# Patient Record
Sex: Male | Born: 1960 | Race: Black or African American | Hispanic: No | Marital: Married | Smoking: Never smoker
Health system: Southern US, Community
[De-identification: ages and names within clinical notes are randomized; demographics above are authoritative.]

## PROBLEM LIST (undated history)

## (undated) DIAGNOSIS — I10 Essential (primary) hypertension: Secondary | ICD-10-CM

## (undated) DIAGNOSIS — E119 Type 2 diabetes mellitus without complications: Secondary | ICD-10-CM

## (undated) DIAGNOSIS — M1990 Unspecified osteoarthritis, unspecified site: Secondary | ICD-10-CM

## (undated) DIAGNOSIS — M109 Gout, unspecified: Secondary | ICD-10-CM

## (undated) HISTORY — DX: Unspecified osteoarthritis, unspecified site: M19.90

## (undated) HISTORY — PX: CARPAL TUNNEL RELEASE: SHX101

## (undated) HISTORY — PX: FOOT SURGERY: SHX648

## (undated) HISTORY — DX: Type 2 diabetes mellitus without complications: E11.9

## (undated) HISTORY — PX: HAND SURGERY: SHX662

## (undated) HISTORY — PX: OTHER SURGICAL HISTORY: SHX169

## (undated) HISTORY — PX: SHOULDER SURGERY: SHX246

---

## 1997-09-17 ENCOUNTER — Emergency Department (HOSPITAL_COMMUNITY): Admission: EM | Admit: 1997-09-17 | Discharge: 1997-09-17 | Payer: Self-pay | Admitting: Emergency Medicine

## 1997-09-18 ENCOUNTER — Ambulatory Visit (HOSPITAL_BASED_OUTPATIENT_CLINIC_OR_DEPARTMENT_OTHER): Admission: RE | Admit: 1997-09-18 | Discharge: 1997-09-18 | Payer: Self-pay | Admitting: Orthopedic Surgery

## 2000-07-13 ENCOUNTER — Emergency Department (HOSPITAL_COMMUNITY): Admission: EM | Admit: 2000-07-13 | Discharge: 2000-07-13 | Payer: Self-pay | Admitting: Emergency Medicine

## 2002-02-27 ENCOUNTER — Emergency Department (HOSPITAL_COMMUNITY): Admission: EM | Admit: 2002-02-27 | Discharge: 2002-02-27 | Payer: Self-pay | Admitting: Emergency Medicine

## 2004-01-05 ENCOUNTER — Emergency Department (HOSPITAL_COMMUNITY): Admission: EM | Admit: 2004-01-05 | Discharge: 2004-01-05 | Payer: Self-pay | Admitting: Emergency Medicine

## 2006-02-13 ENCOUNTER — Emergency Department (HOSPITAL_COMMUNITY): Admission: EM | Admit: 2006-02-13 | Discharge: 2006-02-13 | Payer: Self-pay | Admitting: Emergency Medicine

## 2007-02-26 ENCOUNTER — Emergency Department (HOSPITAL_COMMUNITY): Admission: EM | Admit: 2007-02-26 | Discharge: 2007-02-26 | Payer: Self-pay | Admitting: Emergency Medicine

## 2007-06-02 ENCOUNTER — Emergency Department (HOSPITAL_COMMUNITY): Admission: EM | Admit: 2007-06-02 | Discharge: 2007-06-02 | Payer: Self-pay | Admitting: Family Medicine

## 2007-10-16 ENCOUNTER — Ambulatory Visit: Payer: Self-pay | Admitting: Pulmonary Disease

## 2007-10-16 ENCOUNTER — Inpatient Hospital Stay (HOSPITAL_COMMUNITY): Admission: EM | Admit: 2007-10-16 | Discharge: 2007-10-19 | Payer: Self-pay | Admitting: Emergency Medicine

## 2008-11-06 ENCOUNTER — Emergency Department (HOSPITAL_COMMUNITY): Admission: EM | Admit: 2008-11-06 | Discharge: 2008-11-06 | Payer: Self-pay | Admitting: Emergency Medicine

## 2008-12-02 ENCOUNTER — Emergency Department (HOSPITAL_COMMUNITY): Admission: EM | Admit: 2008-12-02 | Discharge: 2008-12-02 | Payer: Self-pay | Admitting: Emergency Medicine

## 2009-02-10 ENCOUNTER — Emergency Department (HOSPITAL_COMMUNITY): Admission: EM | Admit: 2009-02-10 | Discharge: 2009-02-10 | Payer: Self-pay | Admitting: Family Medicine

## 2009-08-17 ENCOUNTER — Emergency Department (HOSPITAL_COMMUNITY): Admission: EM | Admit: 2009-08-17 | Discharge: 2009-08-17 | Payer: Self-pay | Admitting: Emergency Medicine

## 2010-07-08 LAB — POCT URINALYSIS DIP (DEVICE)
Glucose, UA: NEGATIVE mg/dL
Ketones, ur: NEGATIVE mg/dL
Leukocytes, UA: NEGATIVE
Nitrite: NEGATIVE
Protein, ur: 30 mg/dL — AB
Specific Gravity, Urine: 1.01 (ref 1.005–1.030)
Urobilinogen, UA: 1 mg/dL (ref 0.0–1.0)
pH: 7 (ref 5.0–8.0)

## 2010-08-18 NOTE — H&P (Signed)
NAMEANDRIAN, SABALA               ACCOUNT NO.:  [id number]   MEDICAL RECORD NO.:  [id number]          PATIENT TYPE:  EMS   LOCATION:  [facility]JO                         FACILITY:  [facility]   PHYSICIAN:  Vanetta Mulders, MD         DATE OF BIRTH:  06/26/1960   DATE OF ADMISSION:  10/16/2007  DATE OF DISCHARGE:                              HISTORY & PHYSICAL   CHIEF COMPLAINT:  Malaise, fever, chills, and dull headache.   HISTORY OF PRESENT ILLNESS:  Mr. Govoni is a 50 year old gentleman with a  past medical history significant for hypertension, on blood pressure  medication enalapril that was started months ago, gout, on p.r.n.  indomethacin and otherwise allergic rhinitis and allergic sinusitis, who  presented to the ED for fever, chills, malaise, and dull headache.  The  patient also states that he has been having some generalized muscle  weakness, but he denies sore throat, lymphadenopathy, cough, shortness  of breath, chest pain, abdominal pain, nausea, vomiting, diarrhea,  urinary frequency, urinary urgency, rash, tick exposure, sick exposure,  clotting disorder, heat exposure, and staying in the sun and in the heat  for a longtime, over-medicating with the blood pressure pills.   PAST MEDICAL HISTORY:  1. Hypertension.  2. Gout.  3. Allergic rhinitis.  4. Allergic sinusitis.   SOCIAL HISTORY:  The patient is married, lives with his wife.  He denies  smoking or using illicit drugs.  He used marijuana and cocaine, he quit  in 1994.  Denies excessive drinking.  He had 2 beers and 2 shots of gin  on the day prior to the admission.  He works as a Music therapist.   MEDICATIONS:  Enalapril and indomethacin p.r.n.   PHYSICAL EXAMINATION:  VITAL SIGNS:  Temperature, initial 102.6 and  repeat 98.9, blood pressure on admission 76/47, increased with fluid  resuscitation to 84/50, heart rate 91 to 102, respiratory rate 18 to 20,  and oxygen saturation 95%.  GENERAL:  The patient was in no acute  distress.  HEENT:  Eyes, PERRLA.  Extraocular movements intact.  Oropharynx clear.  NECK:  Minimal 0.5 cm lymphadenopathy bilaterally.  No neck stiffness.  No sinus pressure.  CARDIOVASCULAR:  Regular rate and rhythm.  No murmurs.  No rubs or  gallops.  RESPIRATORY:  Very good air movement.  No crackles.  No wheezing.  ABDOMEN:  Bowel sounds positive.  Soft, nontender, and nondistended.  No  guarding.  No rebound.  SKIN:  No rash.  NEUROLOGIC:  Nonfocal.  No meningeal signs.  Alert and oriented.   LABORATORY DATA:  Sodium 137, potassium 3.6, chloride 102, bicarb 23,  BUN 13, creatinine 1.4, and glucose 120.  White count 10.5, hemoglobin  13.9, and platelets 277.   ASSESSMENT AND PLAN:  1. Malaise, fever, chills, and headache with no meningeal signs.      Differential diagnoses is very broad, and I would include flu      versus meningitis versus sinusitis versus gout versus other like      drug reaction, hyperthermia, or deep venous thrombosis, or  pulmonary embolism.  I will start the patient on Tamiflu.  I will      check a CT of head, sinus protocol, and I will start him on      Rocephin and vancomycin and continue fluid resuscitation.  I will      also check a UA, a urine pneumococcus antigen, urine gonococcus,      and chlamydia antigens, a chest x-ray, blood cultures, and an HIV      test.  The patient will be admitted to step-down unit for blood      pressure monitoring and fluid resuscitation.   1. Hypotension, most likely due to an infectious etiology even though      at this time, the source is unknown.  I will check blood cultures      and chest x-ray, UA, urine pneumococcus antigen, and sinus CT.  I      will have a very low threshold to do an LP if the CT head and CT      sinus protocol is negative.  I will also check a D-dimer and      cardiac enzymes and a cortisol level.   1. Allergic rhinitis, sinusitis.  It is possible that the patient has      acute  sinusitis.  I will start him on Flonase nasal spray for      symptoms.   1. Deep venous thrombosis prophylaxis will be done by early      ambulation.   1. Gastrointestinal prophylaxis, Protonix.      Vanetta Mulders, MD  Electronically Signed     DA/MEDQ  D:  10/16/2007  T:  10/16/2007  Job:  [id number]

## 2010-08-18 NOTE — Discharge Summary (Signed)
NAMEAMARA, [name]   MEDICAL RECORD NO.:  [id number]          PATIENT TYPE:  INP   LOCATION:  5528                         FACILITY:  [facility]   PHYSICIAN:  Kela Millin, M.D.DATE OF BIRTH:  [birth year]   DATE OF ADMISSION:  10/15/2007  DATE OF DISCHARGE:  10/19/2007                               DISCHARGE SUMMARY   DISCHARGE DIAGNOSES:  1. Febrile illness with sepsis syndrome - unclear etiology, resolved.  2. Hypertension.  3. History of gout.  4. History of allergic rhinitis.  5. Sinusitis.   PROCEDURE AND STUDIES:  CT scan of the head - no acute intracranial  abnormality.   CONSULTATIONS:  Critical care - Dr. Frederico Hamman.   BRIEF HISTORY:  The patient is a 50 year old white male with the above-  listed medical problems who presented with malaise, fevers, chills and a  dull headache.  He also reported that he had been having some  generalized muscle weakness, but he denied sore throat, cough, shortness  of breath, chest pain, abdominal pain, nausea, vomiting, diarrhea,  urinary frequency, rash, tick exposure and no sick contacts.  He also  denied any exposure.   He was seen in the ER.  Blood pressures initially on arrival 76/47 -  improved with fluids to 84/50, and his temperature 102.6 initially.  He  was admitted for further evaluation and management.   Please see the full admission history and physical dictated on October 16, 2007 by Dr. Frederico Hamman for the details of the admission physical exam as well  as the laboratory data.   HOSPITAL COURSE:  1. Febrile illness with sepsis syndrome - unclear etiology.  Upon      admission, the patient was started on broad-spectrum antibiotics      and also was placed in droplet isolation and the influenza/H1N1      tests ordered.  He was also empirically placed on Tamiflu.  The      empirical antibiotic coverage included doxycycline to cover for any      possible tick-borne diseases.  It was noted that  the patient did      not have any meningeal signs/symptoms.  He was aggressively      hydrated with normal saline with good response and normalization of      his blood pressures.  Critical Care was consulted, and they      followed the patient.  It was noted that the patient was      lymphopenic, and so an HIV test was done which was nonreactive; a      urine strep test was also done, and this was negative.  Urinalysis      was unremarkable for infection.  Blood cultures were sent and to      date no growth.  The patient was monitored initially in the step-      down until his hypotension resolved.  As he was improving      clinically overall and with the cultures being negative, the      vancomycin was discontinued and the patient monitored off it.  The      patient remained afebrile of the vancomycin.  The H1N1 PCR test      came back today, and it is negative.  The patient was      hemodynamically stable.  He is ambulating without difficulty and      had remained afebrile for 48 hours.  He is requesting to go home at      this time and will be discharged on oral doxycycline and is to      follow up with his primary care physician.  A GC chlamydia probe      was also done in the hospital, and this was negative as well.  A      random cortisol level also was within normal limits.  The etiology      of fever with sepsis syndrome is unclear.  As already mentioned,      the patient is to follow up with his primary care physician.  2. Hypertension - once the patient's hypotension, as discussed above,      resolved.  The patient's blood pressures were remaining elevated,      and so he was restarted on his outpatient antihypertensives.  He is      to continue this upon discharge.  3. History of gout - the patient is to continue outpatient medications      upon discharge.  4. History of allergic rhinitis - the patient was placed on Flonase      during his hospital stay.   DISCHARGE  MEDICATIONS:  1. Doxycycline 100 mg p.o. b.i.d. for 10 more days.  2. Flonase 1 spray b.i.d.  3. Patient to continue enalapril 10 mg p.o. daily and also continue      indomethacin p.r.n. as previously.   FOLLOW-UP CARE:  Dr. Charise Carwin Sun/PCP in 1-2 weeks.   DISCHARGE CONDITION:  Improved/stable.      Kela Millin, M.D.  Electronically Signed     ACV/MEDQ  D:  10/19/2007  T:  10/19/2007  Job:  [id number]   cc:   Deatra James, M.D.

## 2010-12-31 LAB — COMPREHENSIVE METABOLIC PANEL
ALT: 35
AST: 44 — ABNORMAL HIGH
Albumin: 3.6
Alkaline Phosphatase: 47
BUN: 8
CO2: 23
Calcium: 8.8
Chloride: 106
Creatinine, Ser: 1.17
GFR calc Af Amer: >60
GFR calc non Af Amer: >60
Glucose, Bld: 148 — ABNORMAL HIGH
Potassium: 4
Sodium: 138
Total Bilirubin: 0.9
Total Protein: 6.7

## 2010-12-31 LAB — URINALYSIS, ROUTINE W REFLEX MICROSCOPIC
Bilirubin Urine: NEGATIVE
Glucose, UA: NEGATIVE
Hgb urine dipstick: NEGATIVE
Ketones, ur: NEGATIVE
Nitrite: NEGATIVE
Protein, ur: NEGATIVE
Specific Gravity, Urine: 1.011
Urobilinogen, UA: 0.2
pH: 6.5

## 2010-12-31 LAB — PROTIME-INR
INR: 1.1
Prothrombin Time: 14.3

## 2010-12-31 LAB — BASIC METABOLIC PANEL
BUN: 8
CO2: 26
Calcium: 8.9
Chloride: 106
Creatinine, Ser: 1.02
GFR calc Af Amer: >60
GFR calc non Af Amer: >60
Glucose, Bld: 126 — ABNORMAL HIGH
Potassium: 3.9
Sodium: 138

## 2010-12-31 LAB — CULTURE, BLOOD (ROUTINE X 2)
Culture: NO GROWTH
Culture: NO GROWTH

## 2010-12-31 LAB — POCT I-STAT, CHEM 8
BUN: 13
Calcium, Ion: 1.17
Chloride: 102
Creatinine, Ser: 1.4
Glucose, Bld: 120 — ABNORMAL HIGH
HCT: 45
Hemoglobin: 15.3
Potassium: 3.6
Sodium: 137
TCO2: 23

## 2010-12-31 LAB — CBC
HCT: 38.6 — ABNORMAL LOW
HCT: 41.5
Hemoglobin: 12.9 — ABNORMAL LOW
Hemoglobin: 13.9
MCHC: 33.4
MCHC: 33.5
MCV: 82.3
MCV: 83.1
Platelets: 270
Platelets: 277
RBC: 4.65
RBC: 5.03
RDW: 13.1
RDW: 13.7
WBC: 10.5
WBC: 10.5

## 2010-12-31 LAB — DIFFERENTIAL
Basophils Absolute: 0
Basophils Absolute: 0
Basophils Relative: 0
Basophils Relative: 1
Eosinophils Absolute: 0
Eosinophils Absolute: 0.1
Eosinophils Relative: 0
Eosinophils Relative: 1
Lymphocytes Relative: 14
Lymphocytes Relative: 4 — ABNORMAL LOW
Lymphs Abs: 0.4 — ABNORMAL LOW
Lymphs Abs: 1.5
Monocytes Absolute: 0.2
Monocytes Absolute: 1.4 — ABNORMAL HIGH
Monocytes Relative: 13 — ABNORMAL HIGH
Monocytes Relative: 2 — ABNORMAL LOW
Neutro Abs: 7.4
Neutro Abs: 9.8 — ABNORMAL HIGH
Neutrophils Relative %: 71
Neutrophils Relative %: 93 — ABNORMAL HIGH

## 2010-12-31 LAB — CORTISOL: Cortisol, Plasma: 15.9

## 2010-12-31 LAB — SEDIMENTATION RATE: Sed Rate: 4

## 2010-12-31 LAB — H1N1 SCREEN (PCR): H1N1 Virus Scrn: NOT DETECTED

## 2010-12-31 LAB — HIV ANTIBODY (ROUTINE TESTING W REFLEX): HIV: NONREACTIVE

## 2010-12-31 LAB — LEGIONELLA ANTIGEN, URINE: Legionella Antigen, Urine: NEGATIVE

## 2010-12-31 LAB — APTT: aPTT: 31

## 2010-12-31 LAB — LACTIC ACID, PLASMA
Lactic Acid, Venous: 1.9
Lactic Acid, Venous: 3 — ABNORMAL HIGH

## 2010-12-31 LAB — TROPONIN I: Troponin I: <0.01

## 2010-12-31 LAB — CK TOTAL AND CKMB (NOT AT ARMC)
CK, MB: 1.5
Relative Index: 0.6
Total CK: 231

## 2010-12-31 LAB — LIPASE, BLOOD: Lipase: 24

## 2010-12-31 LAB — D-DIMER, QUANTITATIVE: D-Dimer, Quant: 0.51 — ABNORMAL HIGH

## 2010-12-31 LAB — STREP PNEUMONIAE URINARY ANTIGEN: Strep Pneumo Urinary Antigen: NEGATIVE

## 2010-12-31 LAB — GC/CHLAMYDIA PROBE AMP, URINE
Chlamydia, Swab/Urine, PCR: NEGATIVE
GC Probe Amp, Urine: NEGATIVE

## 2010-12-31 LAB — C-REACTIVE PROTEIN: CRP: 3.7 — ABNORMAL HIGH (ref <0.6)

## 2011-01-01 LAB — BASIC METABOLIC PANEL
BUN: 6
BUN: 7
CO2: 25
CO2: 25
Calcium: 8.8
Calcium: 9.1
Chloride: 104
Chloride: 106
Creatinine, Ser: 0.91
Creatinine, Ser: 1.02
GFR calc Af Amer: >60
GFR calc Af Amer: >60
GFR calc non Af Amer: >60
GFR calc non Af Amer: >60
Glucose, Bld: 109 — ABNORMAL HIGH
Glucose, Bld: 125 — ABNORMAL HIGH
Potassium: 3.6
Potassium: 3.8
Sodium: 140
Sodium: 140

## 2011-01-01 LAB — CBC
HCT: 36.8 — ABNORMAL LOW
Hemoglobin: 12.5 — ABNORMAL LOW
MCHC: 34
MCV: 82.4
Platelets: 285
RBC: 4.47
RDW: 14.1
WBC: 6.9

## 2011-01-01 LAB — DIFFERENTIAL
Basophils Absolute: 0
Basophils Relative: 0
Eosinophils Absolute: 0.1
Eosinophils Relative: 1
Lymphocytes Relative: 23
Lymphs Abs: 1.6
Monocytes Absolute: 1.1 — ABNORMAL HIGH
Monocytes Relative: 16 — ABNORMAL HIGH
Neutro Abs: 4.1
Neutrophils Relative %: 60

## 2011-09-21 ENCOUNTER — Emergency Department (HOSPITAL_COMMUNITY)
Admission: EM | Admit: 2011-09-21 | Discharge: 2011-09-21 | Disposition: A | Discharge status: Home / Self Care | Payer: 59 | Source: Home / Self Care | Attending: Emergency Medicine | Admitting: Emergency Medicine

## 2011-09-21 ENCOUNTER — Encounter (HOSPITAL_COMMUNITY): Payer: Self-pay | Admitting: Emergency Medicine

## 2011-09-21 DIAGNOSIS — M109 Gout, unspecified: Secondary | ICD-10-CM

## 2011-09-21 HISTORY — DX: Gout, unspecified: M10.9

## 2011-09-21 LAB — URIC ACID: Uric Acid, Serum: 6.3 mg/dL (ref 4.0–7.8)

## 2011-09-21 MED ORDER — ALLOPURINOL 300 MG PO TABS: 300.0000 mg | ORAL_TABLET | Freq: Every day | ORAL | Status: DC

## 2011-09-21 MED ORDER — METHYLPREDNISOLONE ACETATE 80 MG/ML IJ SUSP
INTRAMUSCULAR | Status: AC
  Filled 2011-09-21: qty 1

## 2011-09-21 MED ORDER — METHYLPREDNISOLONE ACETATE 80 MG/ML IJ SUSP
80.0000 mg | Freq: Once | INTRAMUSCULAR | Status: AC
  Administered 2011-09-21: 80 mg via INTRAMUSCULAR

## 2011-09-21 MED ORDER — INDOMETHACIN 50 MG PO CAPS: ORAL_CAPSULE | ORAL | Status: DC

## 2011-09-21 MED ORDER — COLCHICINE 0.6 MG PO TABS: ORAL_TABLET | ORAL | Status: DC

## 2011-09-21 NOTE — ED Notes (Signed)
Left foot pain, initially in left ankle, then involving the foot.  Onset 2 weeks ago.  History of gout

## 2011-09-21 NOTE — ED Provider Notes (Signed)
Chief Complaint  Patient presents with  . Gout    History of Present Illness:   The patient is a 51 year old male who has had a long-standing history of tophaceous gout with he had been on allopurinol but got off of this about a year ago. His last gout attack was about 6 months ago up until 2 weeks ago when he developed pain over the dorsum of the left foot which radiates into the ankle and into the big toe of the left foot. There is swelling, redness, and heat over the dorsum of the foot and it hurts to bear weight. He denies any numbness or tingling. There's been no recent trauma. Possible precipitating factors include ingesting a large amount of seafood and drinking some alcohol.  Review of Systems:  Other than noted above, the patient denies any of the following symptoms: Systemic:  No fevers, chills, sweats, or aches.  No fatigue or tiredness. Musculoskeletal:  No joint pain, arthritis, bursitis, swelling, back pain, or neck pain. Neurological:  No muscular weakness, paresthesias, headache, or trouble with speech or coordination.  No dizziness.   PMFSH:  Past medical history, family history, social history, meds, and allergies were reviewed.  Physical Exam:   Vital signs:  BP 125/73  Pulse 85  Temp 98.8 F (37.1 C) (Oral)  Resp 14  SpO2 98% Gen:  Alert and oriented times 3.  In no distress. Musculoskeletal: There was swelling, redness, heat, and tenderness to palpation over the dorsum of the foot laterally. There was no pain over the ankle and no pain over the MTP of the great toe. Ankles and toes all have full range of motion with no pain, pulses were full, good capillary refill. Otherwise, all joints had a full a ROM with no swelling, bruising or deformity.  No edema, pulses full. Extremities were warm and pink.  Capillary refill was brisk.  Skin:  Clear, warm and dry.  No rash. Neuro:  Alert and oriented times 3.  Muscle strength was normal.  Sensation was intact to light touch.    Course in Urgent Care Center:  The patient was given Depo-Medrol 80 mg IM and tolerated this well without any immediate side effects.   Assessment:  The encounter diagnosis was Gout attack.  Plan:   1.  The following meds were prescribed:   New Prescriptions   ALLOPURINOL (ZYLOPRIM) 300 MG TABLET    Take 1 tablet (300 mg total) by mouth daily.   COLCHICINE 0.6 MG TABLET    Take 2 now and 1 in 1 hour.  May repeat dose once daily.  For gout attack.   INDOMETHACIN (INDOCIN) 50 MG CAPSULE    Take 1 TID with food for gout.  Taper off when attack subsides.   2.  The patient was instructed in symptomatic care, including rest and activity, elevation, application of ice and compression.  Appropriate handouts were given. 3.  The patient was told to return if becoming worse in any way, if no better in 3 or 4 days, and given some red flag symptoms that would indicate earlier return.   4.  The patient was told to follow up with Dr. Deatra James in one week. In the meantime I suggested some dietary changes. I suggested he hold off on allopurinol until his symptoms had subsided.   Reuben Likes, MD 09/21/11 [phone]

## 2011-09-21 NOTE — Discharge Instructions (Signed)
Dietary treatment plays a supplementary role in treatment of gout.  Diet can be expected to decrease the uric acid level by 10 to 15%.  Often medication can effect a much more substantial reduction.  An extremely restrictive diet is not necessary, but here are a few suggestions that might help decrease the frequency of gout attacks. ° °The first and most important measure is to achieve and maintain a healthy body weight (BMI of 20 to 25).  It's been shown that people with a BMI over 25 have an increased risk of gout attacks compared to people with a BMI of less than 25.  Also, gout patients who lose as little as 4.5 kg or 9.9 lbs will decrease their risk of gout attacks. ° °Beyond that, here are a few general guidelines: ° °Eat less: °Red meat °Seafood °Beer and hard alcohol (e.g. gin, vodka, whiskey) °Foods that contain high fructose corn syrup (found in sweets and non-diet sodas) °Organ meats (liver, kidneys, brains, sweetbreads) or foods made from these meats (hot dogs, bologna). ° °Eat more: °Low fat dairy products °A moderate amount of wine (up to two 5 oz servings per day) is not likely to increase the risk of a gout attack. °Coffee may decrease the risk of gout attacks °Vitamin C (500 mg per day) has a mild urate lowering effect ° °

## 2012-04-26 ENCOUNTER — Emergency Department (INDEPENDENT_AMBULATORY_CARE_PROVIDER_SITE_OTHER)
Admission: EM | Admit: 2012-04-26 | Discharge: 2012-04-26 | Disposition: A | Discharge status: Home / Self Care | Payer: 59 | Source: Home / Self Care

## 2012-04-26 ENCOUNTER — Encounter (HOSPITAL_COMMUNITY): Payer: Self-pay | Admitting: *Deleted

## 2012-04-26 DIAGNOSIS — Z8739 Personal history of other diseases of the musculoskeletal system and connective tissue: Secondary | ICD-10-CM

## 2012-04-26 DIAGNOSIS — M25461 Effusion, right knee: Secondary | ICD-10-CM

## 2012-04-26 DIAGNOSIS — M25569 Pain in unspecified knee: Secondary | ICD-10-CM

## 2012-04-26 DIAGNOSIS — M25469 Effusion, unspecified knee: Secondary | ICD-10-CM

## 2012-04-26 HISTORY — DX: Essential (primary) hypertension: I10

## 2012-04-26 LAB — GRAM STAIN

## 2012-04-26 LAB — SYNOVIAL CELL COUNT + DIFF, W/ CRYSTALS
Lymphocytes-Synovial Fld: 6 % (ref 0–20)
Monocyte-Macrophage-Synovial Fluid: 30 % — ABNORMAL LOW (ref 50–90)
Neutrophil, Synovial: 64 % — ABNORMAL HIGH (ref 0–25)
WBC, Synovial: 45589 /mm3 — ABNORMAL HIGH (ref 0–200)

## 2012-04-26 MED ORDER — TRIAMCINOLONE ACETONIDE 40 MG/ML IJ SUSP
40.0000 mg | Freq: Once | INTRAMUSCULAR | Status: AC
  Administered 2012-04-26: 40 mg via INTRAMUSCULAR

## 2012-04-26 MED ORDER — INDOMETHACIN 50 MG PO CAPS: 50.0000 mg | ORAL_CAPSULE | Freq: Two times a day (BID) | ORAL | Status: DC

## 2012-04-26 MED ORDER — TRIAMCINOLONE ACETONIDE 40 MG/ML IJ SUSP
INTRAMUSCULAR | Status: AC
Start: 2012-04-26 — End: 2012-04-26
  Filled 2012-04-26: qty 5

## 2012-04-26 MED ORDER — COLCHICINE 0.6 MG PO TABS: ORAL_TABLET | ORAL | Status: DC

## 2012-04-26 MED ORDER — HYDROCODONE-ACETAMINOPHEN 5-325 MG PO TABS: 1.0000 | ORAL_TABLET | ORAL | Status: DC | PRN

## 2012-04-26 NOTE — ED Provider Notes (Signed)
History     CSN: [id number]  Arrival date & time 04/26/12  1347   None     Chief Complaint  Patient presents with  . Knee Pain    (Consider location/radiation/quality/duration/timing/severity/associated sxs/prior treatment) HPI Comments: 52 year old male presents with moderate to severe right knee pain for approximately one week. He denies any known injury although his job requires climbing ladders and moving furniture. 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 He denies fever, chills or other constitutional symptoms.   Past Medical History  Diagnosis Date  . Gout   . Hypertension     Past Surgical History  Procedure Date  . Foot surgery     left   . Shoulder surgery     right  . Hand surgery     left  . Carpal tunnel release     bilateral    Family History  Problem Relation Age of Onset  . Cancer Mother   . Cancer Father   . Cancer Brother     History  Substance Use Topics  . Smoking status: Never Smoker   . Smokeless tobacco: Not on file  . Alcohol Use: Yes      Review of Systems  Constitutional: Negative.   Respiratory: Negative.   Gastrointestinal: Negative.   Genitourinary: Negative.   Musculoskeletal: Positive for joint swelling, arthralgias and gait problem.       As per HPI  Skin: Negative.   Neurological: Negative for dizziness, weakness, numbness and headaches.  Psychiatric/Behavioral: Negative.     Allergies  Review of patient's allergies indicates no known allergies.  Home Medications   Current Outpatient Rx  Name  Route  Sig  Dispense  Refill  . IBUPROFEN 800 MG PO TABS   Oral   Take 800 mg by mouth every 8 (eight) hours as needed.         . ALLOPURINOL 300 MG PO TABS   Oral   Take 1 tablet (300 mg total) by mouth daily.   30 tablet   0   . COLCHICINE 0.6 MG PO TABS      Take 2 now and 1 in 1 hour.  May repeat dose once daily.  For gout attack.   12  tablet   0   . COLCHICINE 0.6 MG PO TABS      2 tabs po x 1, then one tab po 1 hour later, then 1 q d. Take with food.   10 tablet   0   . HYDROCODONE-ACETAMINOPHEN 5-325 MG PO TABS   Oral   Take 1 tablet by mouth every 4 (four) hours as needed for pain.   20 tablet   0   . INDOMETHACIN 50 MG PO CAPS      Take 1 TID with food for gout.  Taper off when attack subsides.   30 capsule   0   . INDOMETHACIN 50 MG PO CAPS   Oral   Take 1 capsule (50 mg total) by mouth 2 (two) times daily with a meal.   20 capsule   0     BP 126/71  Pulse 104  Temp 98.5 F (36.9 C) (Oral)  Resp 16  Ht 5\' 5"  (1.651 m)  Wt 190 lb (86.183 kg)  BMI 31.62 kg/m2  SpO2 96%  Physical Exam  Nursing note and vitals reviewed. Constitutional: He is oriented to person, place, and time. He appears well-developed and  well-nourished. No distress.  HENT:  Head: Normocephalic and atraumatic.  Eyes: EOM are normal. Left eye exhibits no discharge.  Neck: Normal range of motion. Neck supple.  Pulmonary/Chest: Effort normal.  Musculoskeletal: He exhibits edema and tenderness.       Right knee swelling and generally tender. There is much pain associated with flexion and extension. He is unable to flex beyond 15 and is unable to extend beyond 170. There is increased warmth around the entire knee but no lymphangitis. There is pretibial swelling over the bursa. Ambulation is quite difficult as he is unable to put any weight on the knee.   Neurological: He is alert and oriented to person, place, and time. No cranial nerve deficit.  Skin: Skin is warm and dry.  Psychiatric: He has a normal mood and affect.    ED Course  Apiration of blood/fluid Date/Time: 04/26/2012 2:15 PM Performed by: Phineas Real Soua Caltagirone Authorized by: Phineas Real, Xzavier Swinger Consent: Verbal consent obtained. Risks and benefits: risks, benefits and alternatives were discussed Consent given by: patient Patient understanding: patient states understanding  of the procedure being performed Patient identity confirmed: verbally with patient Preparation: Patient was prepped and draped in the usual sterile fashion. Local anesthesia used: yes Anesthesia: local infiltration Local anesthetic: lidocaine 2% without epinephrine Anesthetic total: 5 ml Patient sedated: no Patient tolerance: Patient tolerated the procedure well with no immediate complications. Comments: After the dermis was anesthetized along with the track into the joint an 18-gauge needle was inserted into the lateral joint space. There was no resistance and the needle enter the joint space smoothly. 43 cc of cloudy amber fluid was aspirated. No blood was seen in the fluid.   (including critical care time)  Labs Reviewed - No data to display No results found.   1. Knee effusion, right   2. Knee pain   3. H/O acute gouty arthritis       MDM  The knee was aspirated and obtained a 43 cc of cloudy amber fluid. No redness indicating a traumatic tap. Presumptive diagnosis is gouty arthritis and will be treated with colchicine 0.6 mg 2 now repeat one tablet one hour and then 1 daily for 6 days. Indomethacin 50 mg with food 3 times a day when necessary Norco 5 mg one every 4 hours when necessary pain Kenalog 40 mg IM now Return to urgent care Friday or 2 days, if there is little to no improvement or any worsening. A specimen was sent to the lab for crystals, WBC, Gram stain and culture.         Hayden Rasmussen, NP 04/26/12 1558

## 2012-04-26 NOTE — ED Notes (Signed)
5-6 days of right knee pain.  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   H ambulated in the treatment room with a limp

## 2012-04-26 NOTE — ED Provider Notes (Signed)
Medical screening examination/treatment/procedure(s) were performed by non-physician practitioner and as supervising physician I was immediately available for consultation/collaboration.  Raynald Blend, MD 04/26/12 [phone]

## 2012-04-30 LAB — BODY FLUID CULTURE: Culture: NO GROWTH

## 2012-05-15 NOTE — ED Notes (Addendum)
Gram stain: No organisms seen, Body fluid culture: No growth, synovial fluid cell count: Yellow, turbid,  WBC's 45,589, Neut. 64, Lymphs 6, Monos's 30.  Vassie Moselle 05/15/2012 2/10  No further action needed per Hayden Rasmussen NP.  Vassie Moselle 05/18/2012

## 2013-12-06 ENCOUNTER — Emergency Department (HOSPITAL_COMMUNITY)
Admission: EM | Admit: 2013-12-06 | Discharge: 2013-12-06 | Disposition: A | Discharge status: Home / Self Care | Payer: 59 | Attending: Emergency Medicine | Admitting: Emergency Medicine

## 2013-12-06 DIAGNOSIS — Z862 Personal history of diseases of the blood and blood-forming organs and certain disorders involving the immune mechanism: Secondary | ICD-10-CM | POA: Insufficient documentation

## 2013-12-06 DIAGNOSIS — R519 Headache, unspecified: Secondary | ICD-10-CM

## 2013-12-06 DIAGNOSIS — Z8639 Personal history of other endocrine, nutritional and metabolic disease: Secondary | ICD-10-CM | POA: Insufficient documentation

## 2013-12-06 DIAGNOSIS — Z79899 Other long term (current) drug therapy: Secondary | ICD-10-CM | POA: Diagnosis not present

## 2013-12-06 DIAGNOSIS — R51 Headache: Secondary | ICD-10-CM | POA: Insufficient documentation

## 2013-12-06 DIAGNOSIS — I10 Essential (primary) hypertension: Secondary | ICD-10-CM | POA: Diagnosis not present

## 2013-12-06 MED ORDER — PROCHLORPERAZINE EDISYLATE 5 MG/ML IJ SOLN
10.0000 mg | Freq: Four times a day (QID) | INTRAMUSCULAR | Status: DC | PRN
  Administered 2013-12-06: 10 mg via INTRAVENOUS
  Filled 2013-12-06: qty 2

## 2013-12-06 MED ORDER — KETOROLAC TROMETHAMINE 15 MG/ML IJ SOLN
15.0000 mg | Freq: Once | INTRAMUSCULAR | Status: AC
  Administered 2013-12-06: 15 mg via INTRAVENOUS
  Filled 2013-12-06: qty 1

## 2013-12-06 MED ORDER — DIPHENHYDRAMINE HCL 50 MG/ML IJ SOLN
25.0000 mg | Freq: Once | INTRAMUSCULAR | Status: AC
  Administered 2013-12-06: 25 mg via INTRAVENOUS
  Filled 2013-12-06: qty 1

## 2013-12-06 MED ORDER — SODIUM CHLORIDE 0.9 % IV BOLUS (SEPSIS)
1000.0000 mL | Freq: Once | INTRAVENOUS | Status: AC
  Administered 2013-12-06: 1000 mL via INTRAVENOUS

## 2013-12-06 NOTE — Discharge Instructions (Signed)
Headaches, Frequently Asked Questions °MIGRAINE HEADACHES °Q: What is migraine? What causes it? How can I treat it? °A: Generally, migraine headaches begin as a dull ache. Then they develop into a constant, throbbing, and pulsating pain. You may experience pain at the temples. You may experience pain at the front or back of one or both sides of the head. The pain is usually accompanied by a combination of: °· Nausea. °· Vomiting. °· Sensitivity to light and noise. °Some people (about 15%) experience an aura (see below) before an attack. The cause of migraine is believed to be chemical reactions in the brain. Treatment for migraine may include over-the-counter or prescription medications. It may also include self-help techniques. These include relaxation training and biofeedback.  °Q: What is an aura? °A: About 15% of people with migraine get an "aura". This is a sign of neurological symptoms that occur before a migraine headache. You may see wavy or jagged lines, dots, or flashing lights. You might experience tunnel vision or blind spots in one or both eyes. The aura can include visual or auditory hallucinations (something imagined). It may include disruptions in smell (such as strange odors), taste or touch. Other symptoms include: °· Numbness. °· A "pins and needles" sensation. °· Difficulty in recalling or speaking the correct word. °These neurological events may last as long as 60 minutes. These symptoms will fade as the headache begins. °Q: What is a trigger? °A: Certain physical or environmental factors can lead to or "trigger" a migraine. These include: °· Foods. °· Hormonal changes. °· Weather. °· Stress. °It is important to remember that triggers are different for everyone. To help prevent migraine attacks, you need to figure out which triggers affect you. Keep a headache diary. This is a good way to track triggers. The diary will help you talk to your healthcare professional about your condition. °Q: Does  weather affect migraines? °A: Bright sunshine, hot, humid conditions, and drastic changes in barometric pressure may lead to, or "trigger," a migraine attack in some people. But studies have shown that weather does not act as a trigger for everyone with migraines. °Q: What is the link between migraine and hormones? °A: Hormones start and regulate many of your body's functions. Hormones keep your body in balance within a constantly changing environment. The levels of hormones in your body are unbalanced at times. Examples are during menstruation, pregnancy, or menopause. That can lead to a migraine attack. In fact, about three quarters of all women with migraine report that their attacks are related to the menstrual cycle.  °Q: Is there an increased risk of stroke for migraine sufferers? °A: The likelihood of a migraine attack causing a stroke is very remote. That is not to say that migraine sufferers cannot have a stroke associated with their migraines. In persons under age 40, the most common associated factor for stroke is migraine headache. But over the course of a person's normal life span, the occurrence of migraine headache may actually be associated with a reduced risk of dying from cerebrovascular disease due to stroke.  °Q: What are acute medications for migraine? °A: Acute medications are used to treat the pain of the headache after it has started. Examples over-the-counter medications, NSAIDs, ergots, and triptans.  °Q: What are the triptans? °A: Triptans are the newest class of abortive medications. They are specifically targeted to treat migraine. Triptans are vasoconstrictors. They moderate some chemical reactions in the brain. The triptans work on receptors in your brain. Triptans help   to restore the balance of a neurotransmitter called serotonin. Fluctuations in levels of serotonin are thought to be a main cause of migraine.  °Q: Are over-the-counter medications for migraine effective? °A:  Over-the-counter, or "OTC," medications may be effective in relieving mild to moderate pain and associated symptoms of migraine. But you should see your caregiver before beginning any treatment regimen for migraine.  °Q: What are preventive medications for migraine? °A: Preventive medications for migraine are sometimes referred to as "prophylactic" treatments. They are used to reduce the frequency, severity, and length of migraine attacks. Examples of preventive medications include antiepileptic medications, antidepressants, beta-blockers, calcium channel blockers, and NSAIDs (nonsteroidal anti-inflammatory drugs). °Q: Why are anticonvulsants used to treat migraine? °A: During the past few years, there has been an increased interest in antiepileptic drugs for the prevention of migraine. They are sometimes referred to as "anticonvulsants". Both epilepsy and migraine may be caused by similar reactions in the brain.  °Q: Why are antidepressants used to treat migraine? °A: Antidepressants are typically used to treat people with depression. They may reduce migraine frequency by regulating chemical levels, such as serotonin, in the brain.  °Q: What alternative therapies are used to treat migraine? °A: The term "alternative therapies" is often used to describe treatments considered outside the scope of conventional Western medicine. Examples of alternative therapy include acupuncture, acupressure, and yoga. Another common alternative treatment is herbal therapy. Some herbs are believed to relieve headache pain. Always discuss alternative therapies with your caregiver before proceeding. Some herbal products contain arsenic and other toxins. °TENSION HEADACHES °Q: What is a tension-type headache? What causes it? How can I treat it? °A: Tension-type headaches occur randomly. They are often the result of temporary stress, anxiety, fatigue, or anger. Symptoms include soreness in your temples, a tightening band-like sensation  around your head (a "vice-like" ache). Symptoms can also include a pulling feeling, pressure sensations, and contracting head and neck muscles. The headache begins in your forehead, temples, or the back of your head and neck. Treatment for tension-type headache may include over-the-counter or prescription medications. Treatment may also include self-help techniques such as relaxation training and biofeedback. °CLUSTER HEADACHES °Q: What is a cluster headache? What causes it? How can I treat it? °A: Cluster headache gets its name because the attacks come in groups. The pain arrives with little, if any, warning. It is usually on one side of the head. A tearing or bloodshot eye and a runny nose on the same side of the headache may also accompany the pain. Cluster headaches are believed to be caused by chemical reactions in the brain. They have been described as the most severe and intense of any headache type. Treatment for cluster headache includes prescription medication and oxygen. °SINUS HEADACHES °Q: What is a sinus headache? What causes it? How can I treat it? °A: When a cavity in the bones of the face and skull (a sinus) becomes inflamed, the inflammation will cause localized pain. This condition is usually the result of an allergic reaction, a tumor, or an infection. If your headache is caused by a sinus blockage, such as an infection, you will probably have a fever. An x-ray will confirm a sinus blockage. Your caregiver's treatment might include antibiotics for the infection, as well as antihistamines or decongestants.  °REBOUND HEADACHES °Q: What is a rebound headache? What causes it? How can I treat it? °A: A pattern of taking acute headache medications too often can lead to a condition known as "rebound headache."   A pattern of taking too much headache medication includes taking it more than 2 days per week or in excessive amounts. That means more than the label or a caregiver advises. With rebound  headaches, your medications not only stop relieving pain, they actually begin to cause headaches. Doctors treat rebound headache by tapering the medication that is being overused. Sometimes your caregiver will gradually substitute a different type of treatment or medication. Stopping may be a challenge. Regularly overusing a medication increases the potential for serious side effects. Consult a caregiver if you regularly use headache medications more than 2 days per week or more than the label advises. °ADDITIONAL QUESTIONS AND ANSWERS °Q: What is biofeedback? °A: Biofeedback is a self-help treatment. Biofeedback uses special equipment to monitor your body's involuntary physical responses. Biofeedback monitors: °· Breathing. °· Pulse. °· Heart rate. °· Temperature. °· Muscle tension. °· Brain activity. °Biofeedback helps you refine and perfect your relaxation exercises. You learn to control the physical responses that are related to stress. Once the technique has been mastered, you do not need the equipment any more. °Q: Are headaches hereditary? °A: Four out of five (80%) of people that suffer report a family history of migraine. Scientists are not sure if this is genetic or a family predisposition. Despite the uncertainty, a child has a 50% chance of having migraine if one parent suffers. The child has a 75% chance if both parents suffer.  °Q: Can children get headaches? °A: By the time they reach high school, most young people have experienced some type of headache. Many safe and effective approaches or medications can prevent a headache from occurring or stop it after it has begun.  °Q: What type of doctor should I see to diagnose and treat my headache? °A: Start with your primary caregiver. Discuss his or her experience and approach to headaches. Discuss methods of classification, diagnosis, and treatment. Your caregiver may decide to recommend you to a headache specialist, depending upon your symptoms or other  physical conditions. Having diabetes, allergies, etc., may require a more comprehensive and inclusive approach to your headache. The [employer] will provide, upon request, a list of NHF physician members in your state. °Document Released: 06/12/2003 Document Revised: 06/14/2011 Document Reviewed: 11/20/2007 °[employer]® Patient Information ©2015 [employer]. This information is not intended to replace advice given to you by your health care provider. Make sure you discuss any questions you have with your health care provider. ° °Hypertension °Hypertension, commonly called high blood pressure, is when the force of blood pumping through your arteries is too strong. Your arteries are the blood vessels that carry blood from your heart throughout your body. A blood pressure reading consists of a higher number over a lower number, such as 110/72. The higher number (systolic) is the pressure inside your arteries when your heart pumps. The lower number (diastolic) is the pressure inside your arteries when your heart relaxes. Ideally you want your blood pressure below 120/80. °Hypertension forces your heart to work harder to pump blood. Your arteries may become narrow or stiff. Having hypertension puts you at risk for heart disease, stroke, and other problems.  °RISK FACTORS °Some risk factors for high blood pressure are controllable. Others are not.  °Risk factors you cannot control include:  °· Race. You may be at higher risk if you are African American. °· Age. Risk increases with age. °· Gender. Men are at higher risk than women before age 45 years. After age 65, women are at   higher risk than men. Risk factors you can control include:  Not getting enough exercise or physical activity.  Being overweight.  Getting too much fat, sugar, calories, or salt in your diet.  Drinking too much alcohol. SIGNS AND SYMPTOMS Hypertension does not usually cause signs or symptoms. Extremely high blood  pressure (hypertensive crisis) may cause headache, anxiety, shortness of breath, and nosebleed. DIAGNOSIS  To check if you have hypertension, your health care provider will measure your blood pressure while you are seated, with your arm held at the level of your heart. It should be measured at least twice using the same arm. Certain conditions can cause a difference in blood pressure between your right and left arms. A blood pressure reading that is higher than normal on one occasion does not mean that you need treatment. If one blood pressure reading is high, ask your health care provider about having it checked again. TREATMENT  Treating high blood pressure includes making lifestyle changes and possibly taking medicine. Living a healthy lifestyle can help lower high blood pressure. You may need to change some of your habits. Lifestyle changes may include:  Following the DASH diet. This diet is high in fruits, vegetables, and whole grains. It is low in salt, red meat, and added sugars.  Getting at least 2 hours of brisk physical activity every week.  Losing weight if necessary.  Not smoking.  Limiting alcoholic beverages.  Learning ways to reduce stress. If lifestyle changes are not enough to get your blood pressure under control, your health care provider may prescribe medicine. You may need to take more than one. Work closely with your health care provider to understand the risks and benefits. HOME CARE INSTRUCTIONS  Have your blood pressure rechecked as directed by your health care provider.   Take medicines only as directed by your health care provider. Follow the directions carefully. Blood pressure medicines must be taken as prescribed. The medicine does not work as well when you skip doses. Skipping doses also puts you at risk for problems.   Do not smoke.   Monitor your blood pressure at home as directed by your health care provider. SEEK MEDICAL CARE IF:   You think you  are having a reaction to medicines taken.  You have recurrent headaches or feel dizzy.  You have swelling in your ankles.  You have trouble with your vision. SEEK IMMEDIATE MEDICAL CARE IF:  You develop a severe headache or confusion.  You have unusual weakness, numbness, or feel faint.  You have severe chest or abdominal pain.  You vomit repeatedly.  You have trouble breathing. MAKE SURE YOU:   Understand these instructions.  Will watch your condition.  Will get help right away if you are not doing well or get worse. Document Released: 03/22/2005 Document Revised: 08/06/2013 Document Reviewed: 01/12/2013 [facility] Patient Information 2015 [place], [place]. This information is not intended to replace advice given to you by your health care provider. Make sure you discuss any questions you have with your health care provider.  Headaches, Frequently Asked Questions MIGRAINE HEADACHES Q: What is migraine? What causes it? How can I treat it? A: Generally, migraine headaches begin as a dull ache. Then they develop into a constant, throbbing, and pulsating pain. You may experience pain at the temples. You may experience pain at the front or back of one or both sides of the head. The pain is usually accompanied by a combination of:  Nausea.  Vomiting.  Sensitivity to light  and noise. Some people (about 15%) experience an aura (see below) before an attack. The cause of migraine is believed to be chemical reactions in the brain. Treatment for migraine may include over-the-counter or prescription medications. It may also include self-help techniques. These include relaxation training and biofeedback.  Q: What is an aura? A: About 15% of people with migraine get an "aura". This is a sign of neurological symptoms that occur before a migraine headache. You may see wavy or jagged lines, dots, or flashing lights. You might experience tunnel vision or blind spots in one or both eyes. The aura  can include visual or auditory hallucinations (something imagined). It may include disruptions in smell (such as strange odors), taste or touch. Other symptoms include:  Numbness.  A "pins and needles" sensation.  Difficulty in recalling or speaking the correct word. These neurological events may last as long as 60 minutes. These symptoms will fade as the headache begins. Q: What is a trigger? A: Certain physical or environmental factors can lead to or "trigger" a migraine. These include:  Foods.  Hormonal changes.  Weather.  Stress. It is important to remember that triggers are different for everyone. To help prevent migraine attacks, you need to figure out which triggers affect you. Keep a headache diary. This is a good way to track triggers. The diary will help you talk to your healthcare professional about your condition. Q: Does weather affect migraines? A: Bright sunshine, hot, humid conditions, and drastic changes in barometric pressure may lead to, or "trigger," a migraine attack in some people. But studies have shown that weather does not act as a trigger for everyone with migraines. Q: What is the link between migraine and hormones? A: Hormones start and regulate many of your body's functions. Hormones keep your body in balance within a constantly changing environment. The levels of hormones in your body are unbalanced at times. Examples are during menstruation, pregnancy, or menopause. That can lead to a migraine attack. In fact, about three quarters of all women with migraine report that their attacks are related to the menstrual cycle.  Q: Is there an increased risk of stroke for migraine sufferers? A: The likelihood of a migraine attack causing a stroke is very remote. That is not to say that migraine sufferers cannot have a stroke associated with their migraines. In persons under age 29, the most common associated factor for stroke is migraine headache. But over the course of a  person's normal life span, the occurrence of migraine headache may actually be associated with a reduced risk of dying from cerebrovascular disease due to stroke.  Q: What are acute medications for migraine? A: Acute medications are used to treat the pain of the headache after it has started. Examples over-the-counter medications, NSAIDs, ergots, and triptans.  Q: What are the triptans? A: Triptans are the newest class of abortive medications. They are specifically targeted to treat migraine. Triptans are vasoconstrictors. They moderate some chemical reactions in the brain. The triptans work on receptors in your brain. Triptans help to restore the balance of a neurotransmitter called serotonin. Fluctuations in levels of serotonin are thought to be a main cause of migraine.  Q: Are over-the-counter medications for migraine effective? A: Over-the-counter, or "OTC," medications may be effective in relieving mild to moderate pain and associated symptoms of migraine. But you should see your caregiver before beginning any treatment regimen for migraine.  Q: What are preventive medications for migraine? A: Preventive medications for migraine are  sometimes referred to as "prophylactic" treatments. They are used to reduce the frequency, severity, and length of migraine attacks. Examples of preventive medications include antiepileptic medications, antidepressants, beta-blockers, calcium channel blockers, and NSAIDs (nonsteroidal anti-inflammatory drugs). Q: Why are anticonvulsants used to treat migraine? A: During the past few years, there has been an increased interest in antiepileptic drugs for the prevention of migraine. They are sometimes referred to as "anticonvulsants". Both epilepsy and migraine may be caused by similar reactions in the brain.  Q: Why are antidepressants used to treat migraine? A: Antidepressants are typically used to treat people with depression. They may reduce migraine frequency by  regulating chemical levels, such as serotonin, in the brain.  Q: What alternative therapies are used to treat migraine? A: The term "alternative therapies" is often used to describe treatments considered outside the scope of conventional Western medicine. Examples of alternative therapy include acupuncture, acupressure, and yoga. Another common alternative treatment is herbal therapy. Some herbs are believed to relieve headache pain. Always discuss alternative therapies with your caregiver before proceeding. Some herbal products contain arsenic and other toxins. TENSION HEADACHES Q: What is a tension-type headache? What causes it? How can I treat it? A: Tension-type headaches occur randomly. They are often the result of temporary stress, anxiety, fatigue, or anger. Symptoms include soreness in your temples, a tightening band-like sensation around your head (a "vice-like" ache). Symptoms can also include a pulling feeling, pressure sensations, and contracting head and neck muscles. The headache begins in your forehead, temples, or the back of your head and neck. Treatment for tension-type headache may include over-the-counter or prescription medications. Treatment may also include self-help techniques such as relaxation training and biofeedback. CLUSTER HEADACHES Q: What is a cluster headache? What causes it? How can I treat it? A: Cluster headache gets its name because the attacks come in groups. The pain arrives with little, if any, warning. It is usually on one side of the head. A tearing or bloodshot eye and a runny nose on the same side of the headache may also accompany the pain. Cluster headaches are believed to be caused by chemical reactions in the brain. They have been described as the most severe and intense of any headache type. Treatment for cluster headache includes prescription medication and oxygen. SINUS HEADACHES Q: What is a sinus headache? What causes it? How can I treat it? A: When a  cavity in the bones of the face and skull (a sinus) becomes inflamed, the inflammation will cause localized pain. This condition is usually the result of an allergic reaction, a tumor, or an infection. If your headache is caused by a sinus blockage, such as an infection, you will probably have a fever. An x-ray will confirm a sinus blockage. Your caregiver's treatment might include antibiotics for the infection, as well as antihistamines or decongestants.  REBOUND HEADACHES Q: What is a rebound headache? What causes it? How can I treat it? A: A pattern of taking acute headache medications too often can lead to a condition known as "rebound headache." A pattern of taking too much headache medication includes taking it more than 2 days per week or in excessive amounts. That means more than the label or a caregiver advises. With rebound headaches, your medications not only stop relieving pain, they actually begin to cause headaches. Doctors treat rebound headache by tapering the medication that is being overused. Sometimes your caregiver will gradually substitute a different type of treatment or medication. Stopping may be a challenge.  Regularly overusing a medication increases the potential for serious side effects. Consult a caregiver if you regularly use headache medications more than 2 days per week or more than the label advises. ADDITIONAL QUESTIONS AND ANSWERS Q: What is biofeedback? A: Biofeedback is a self-help treatment. Biofeedback uses special equipment to monitor your body's involuntary physical responses. Biofeedback monitors:  Breathing.  Pulse.  Heart rate.  Temperature.  Muscle tension.  Brain activity. Biofeedback helps you refine and perfect your relaxation exercises. You learn to control the physical responses that are related to stress. Once the technique has been mastered, you do not need the equipment any more. Q: Are headaches hereditary? A: Four out of five (80%) of people  that suffer report a family history of migraine. Scientists are not sure if this is genetic or a family predisposition. Despite the uncertainty, a child has a 50% chance of having migraine if one parent suffers. The child has a 75% chance if both parents suffer.  Q: Can children get headaches? A: By the time they reach high school, most young people have experienced some type of headache. Many safe and effective approaches or medications can prevent a headache from occurring or stop it after it has begun.  Q: What type of doctor should I see to diagnose and treat my headache? A: Start with your primary caregiver. Discuss his or her experience and approach to headaches. Discuss methods of classification, diagnosis, and treatment. Your caregiver may decide to recommend you to a headache specialist, depending upon your symptoms or other physical conditions. Having diabetes, allergies, etc., may require a more comprehensive and inclusive approach to your headache. The [employer] will provide, upon request, a list of [facility] physician members in your state. Document Released: 06/12/2003 Document Revised: 06/14/2011 Document Reviewed: 11/20/2007 [facility] Patient Information 2015 [place], [place]. This information is not intended to replace advice given to you by your health care provider. Make sure you discuss any questions you have with your health care provider.

## 2013-12-06 NOTE — ED Notes (Signed)
The  Pt is c/o a headache for the past 6 hours no n or v no previous history

## 2013-12-06 NOTE — ED Notes (Signed)
Pt A&OX4, ambulatory at d/c with steady gait, NAD, reports he feels much better. Pt reports he understands the importance of getting his BP monitored and re-checked by his PCP.

## 2013-12-13 NOTE — ED Provider Notes (Signed)
CSN: [id number]     Arrival date & time 12/06/13  0355 History   First MD Initiated Contact with Patient 12/06/13 [phone]     Chief Complaint  Patient presents with  . Headache     (Consider location/radiation/quality/duration/timing/severity/associated sxs/prior Treatment) HPI  53 year old male with headache. Gradual onset person 5-6 hours prior to arrival. Denies any trauma. Headache is diffuse and achy. No appreciable exacerbating relieving factors. No acute visual complaints. No fevers or chills. No neck pain or stiffness. Denies any significant headache history. No acute numbness, tingling or loss of strength. Is on blood thinners. No intervention prior to arrival.  Past Medical History  Diagnosis Date  . Gout   . Hypertension    Past Surgical History  Procedure Laterality Date  . Foot surgery      left   . Shoulder surgery      right  . Hand surgery      left  . Carpal tunnel release      bilateral   Family History  Problem Relation Age of Onset  . Cancer Mother   . Cancer Father   . Cancer Brother    History  Substance Use Topics  . Smoking status: Never Smoker   . Smokeless tobacco: Not on file  . Alcohol Use: Yes    Review of Systems  All systems reviewed and negative, other than as noted in HPI.   Allergies  Review of patient's allergies indicates no known allergies.  Home Medications   Prior to Admission medications   Medication Sig Start Date End Date Taking? Authorizing Provider  ibuprofen (ADVIL,MOTRIN) 200 MG tablet Take 400 mg by mouth every 6 (six) hours as needed for headache.   Yes Historical Provider, MD  Multiple Vitamins-Minerals (CENTRUM ADULTS PO) Take 1 tablet by mouth daily.   Yes Historical Provider, MD  Oxymetazoline HCl (AFRIN 12 HOUR NA) Place 2 sprays into both nostrils daily as needed (congestion).   Yes Historical Provider, MD   BP 137/106  Pulse 76  Resp 18  SpO2 99% Physical Exam  Nursing note and vitals  reviewed. Constitutional: He is oriented to person, place, and time. He appears well-developed and well-nourished. No distress.  HENT:  Head: Normocephalic and atraumatic.  Right Ear: External ear normal.  Left Ear: External ear normal.  Eyes: Conjunctivae and EOM are normal. Pupils are equal, round, and reactive to light. Right eye exhibits no discharge. Left eye exhibits no discharge.  Neck: Neck supple.  No nuchal rigidity  Cardiovascular: Normal rate, regular rhythm and normal heart sounds.  Exam reveals no gallop and no friction rub.   No murmur heard. Pulmonary/Chest: Effort normal and breath sounds normal. No respiratory distress.  Abdominal: Soft. He exhibits no distension. There is no tenderness.  Musculoskeletal: He exhibits no edema and no tenderness.  Neurological: He is alert and oriented to person, place, and time.  Speech clear. Content appropriate. Follows commands. Cranial nerves are intact. Strength 5 out of 5 bilateral upper and lower extremities. Good finger to nose testing bilaterally.  Skin: Skin is warm and dry.  Psychiatric: He has a normal mood and affect. His behavior is normal. Thought content normal.    ED Course  Procedures (including critical care time) Labs Review Labs Reviewed - No data to display  Imaging Review No results found.   EKG Interpretation None      MDM   Final diagnoses:  Nonintractable headache, unspecified chronicity pattern, unspecified headache type  Essential hypertension  52yM with headache. Suspect primary HA. Consider emergent secondary causes such as bleed, infectious or mass but doubt. There is no history of trauma. Pt has a nonfocal neurological exam. Afebrile and neck supple. No use of blood thinning medication. Consider ocular etiology such as acute angle closure glaucoma but doubt. Pt denies acute change in visual acuity and eye exam unremarkable. Doubt temporal arteritis given age, no temporal tenderness and  temporal artery pulsations palpable. Doubt CO poisoning. No contacts with similar symptoms. Doubt venous thrombosis. Doubt carotid or vertebral arteries dissection. Symptoms improved with meds. Feel that can be safely discharged, but strict return precautions discussed. Understands need to FU as well with regards to HTN.  Outpt fu.     Raeford Razor, MD 12/13/13 [phone]

## 2014-05-31 ENCOUNTER — Encounter (HOSPITAL_COMMUNITY): Payer: Self-pay

## 2014-05-31 ENCOUNTER — Emergency Department (INDEPENDENT_AMBULATORY_CARE_PROVIDER_SITE_OTHER)
Admission: EM | Admit: 2014-05-31 | Discharge: 2014-05-31 | Disposition: A | Discharge status: Home / Self Care | Payer: 59 | Source: Home / Self Care | Attending: Emergency Medicine | Admitting: Emergency Medicine

## 2014-05-31 DIAGNOSIS — M109 Gout, unspecified: Secondary | ICD-10-CM | POA: Diagnosis not present

## 2014-05-31 DIAGNOSIS — R030 Elevated blood-pressure reading, without diagnosis of hypertension: Secondary | ICD-10-CM

## 2014-05-31 DIAGNOSIS — M546 Pain in thoracic spine: Secondary | ICD-10-CM

## 2014-05-31 DIAGNOSIS — IMO0001 Reserved for inherently not codable concepts without codable children: Secondary | ICD-10-CM

## 2014-05-31 MED ORDER — INDOMETHACIN 50 MG PO CAPS: 50.0000 mg | ORAL_CAPSULE | Freq: Three times a day (TID) | ORAL | Status: DC

## 2014-05-31 MED ORDER — DICLOFENAC SODIUM 1 % TD GEL: 1.0000 "application " | Freq: Four times a day (QID) | TRANSDERMAL | Status: DC

## 2014-05-31 MED ORDER — COLCHICINE 0.6 MG PO TABS: ORAL_TABLET | ORAL | Status: DC

## 2014-05-31 NOTE — Discharge Instructions (Signed)
Back Pain, Adult Low back pain is very common. About 1 in 5 people have back pain.The cause of low back pain is rarely dangerous. The pain often gets better over time.About half of people with a sudden onset of back pain feel better in just 2 weeks. About 8 in 10 people feel better by 6 weeks.  CAUSES Some common causes of back pain include:  Strain of the muscles or ligaments supporting the spine.  Wear and tear (degeneration) of the spinal discs.  Arthritis.  Direct injury to the back. DIAGNOSIS Most of the time, the direct cause of low back pain is not known.However, back pain can be treated effectively even when the exact cause of the pain is unknown.Answering your caregiver's questions about your overall health and symptoms is one of the most accurate ways to make sure the cause of your pain is not dangerous. If your caregiver needs more information, he or she may order lab work or imaging tests (X-rays or MRIs).However, even if imaging tests show changes in your back, this usually does not require surgery. HOME CARE INSTRUCTIONS For many people, back pain returns.Since low back pain is rarely dangerous, it is often a condition that people can learn to [facility] their own.   Remain active. It is stressful on the back to sit or stand in one place. Do not sit, drive, or stand in one place for more than 30 minutes at a time. Take short walks on level surfaces as soon as pain allows.Try to increase the length of time you walk each day.  Do not stay in bed.Resting more than 1 or 2 days can delay your recovery.  Do not avoid exercise or work.Your body is made to move.It is not dangerous to be active, even though your back may hurt.Your back will likely heal faster if you return to being active before your pain is gone.  Pay attention to your body when you bend and lift. Many people have less discomfortwhen lifting if they bend their knees, keep the load close to their bodies,and  avoid twisting. Often, the most comfortable positions are those that put less stress on your recovering back.  Find a comfortable position to sleep. Use a firm mattress and lie on your side with your knees slightly bent. If you lie on your back, put a pillow under your knees.  Only take over-the-counter or prescription medicines as directed by your caregiver. Over-the-counter medicines to reduce pain and inflammation are often the most helpful.Your caregiver may prescribe muscle relaxant drugs.These medicines help dull your pain so you can more quickly return to your normal activities and healthy exercise.  Put ice on the injured area.  Put ice in a plastic bag.  Place a towel between your skin and the bag.  Leave the ice on for 15-20 minutes, 03-04 times a day for the first 2 to 3 days. After that, ice and heat may be alternated to reduce pain and spasms.  Ask your caregiver about trying back exercises and gentle massage. This may be of some benefit.  Avoid feeling anxious or stressed.Stress increases muscle tension and can worsen back pain.It is important to recognize when you are anxious or stressed and learn ways to manage it.Exercise is a great option. SEEK MEDICAL CARE IF:  You have pain that is not relieved with rest or medicine.  You have pain that does not improve in 1 week.  You have new symptoms.  You are generally not feeling well. SEEK  IMMEDIATE MEDICAL CARE IF:   You have pain that radiates from your back into your legs.  You develop new bowel or bladder control problems.  You have unusual weakness or numbness in your arms or legs.  You develop nausea or vomiting.  You develop abdominal pain.  You feel faint. Document Released: 03/22/2005 Document Revised: 09/21/2011 Document Reviewed: 07/24/2013 [facility] Patient Information 2015 [place], [place]. This information is not intended to replace advice given to you by your health care provider. Make sure you  discuss any questions you have with your health care provider.  Back Exercises Back exercises help treat and prevent back injuries. The goal is to increase your strength in your belly (abdominal) and back muscles. These exercises can also help with flexibility. Start these exercises when told by your doctor. HOME CARE Back exercises include: Pelvic Tilt.  Lie on your back with your knees bent. Tilt your pelvis until the lower part of your back is against the floor. Hold this position 5 to 10 sec. Repeat this exercise 5 to 10 times. Knee to Chest.  Pull 1 knee up against your chest and hold for 20 to 30 seconds. Repeat this with the other knee. This may be done with the other leg straight or bent, whichever feels better. Then, pull both knees up against your chest. Sit-Ups or Curl-Ups.  Bend your knees 90 degrees. Start with tilting your pelvis, and do a partial, slow sit-up. Only lift your upper half 30 to 45 degrees off the floor. Take at least 2 to 3 seonds for each sit-up. Do not do sit-ups with your knees out straight. If partial sit-ups are difficult, simply do the above but with only tightening your belly (abdominal) muscles and holding it as told. Hip-Lift.  Lie on your back with your knees flexed 90 degrees. Push down with your feet and shoulders as you raise your hips 2 inches off the floor. Hold for 10 seconds, repeat 5 to 10 times. Back Arches.  Lie on your stomach. Prop yourself up on bent elbows. Slowly press on your hands, causing an arch in your low back. Repeat 3 to 5 times. Shoulder-Lifts.  Lie face down with arms beside your body. Keep hips and belly pressed to floor as you slowly lift your head and shoulders off the floor. Do not overdo your exercises. Be careful in the beginning. Exercises may cause you some mild back discomfort. If the pain lasts for more than 15 minutes, stop the exercises until you see your doctor. Improvement with exercise for back problems is slow.    Document Released: 04/24/2010 Document Revised: 06/14/2011 Document Reviewed: 01/21/2011 [facility] Patient Information 2015 [place], [place]. This information is not intended to replace advice given to you by your health care provider. Make sure you discuss any questions you have with your health care provider.  Gout Gout is an inflammatory arthritis caused by a buildup of uric acid crystals in the joints. Uric acid is a chemical that is normally present in the blood. When the level of uric acid in the blood is too high it can form crystals that deposit in your joints and tissues. This causes joint redness, soreness, and swelling (inflammation). Repeat attacks are common. Over time, uric acid crystals can form into masses (tophi) near a joint, destroying bone and causing disfigurement. Gout is treatable and often preventable. CAUSES  The disease begins with elevated levels of uric acid in the blood. Uric acid is produced by your body when it breaks  down a naturally found substance called purines. Certain foods you eat, such as meats and fish, contain high amounts of purines. Causes of an elevated uric acid level include:  Being passed down from parent to child (heredity).  Diseases that cause increased uric acid production (such as obesity, psoriasis, and certain cancers).  Excessive alcohol use.  Diet, especially diets rich in meat and seafood.  Medicines, including certain cancer-fighting medicines (chemotherapy), water pills (diuretics), and aspirin.  Chronic kidney disease. The kidneys are no longer able to remove uric acid well.  Problems with metabolism. Conditions strongly associated with gout include:  Obesity.  High blood pressure.  High cholesterol.  Diabetes. Not everyone with elevated uric acid levels gets gout. It is not understood why some people get gout and others do not. Surgery, joint injury, and eating too much of certain foods are some of the factors that can lead to  gout attacks. SYMPTOMS   An attack of gout comes on quickly. It causes intense pain with redness, swelling, and warmth in a joint.  Fever can occur.  Often, only one joint is involved. Certain joints are more commonly involved:  Base of the big toe.  Knee.  Ankle.  Wrist.  Finger. Without treatment, an attack usually goes away in a few days to weeks. Between attacks, you usually will not have symptoms, which is different from many other forms of arthritis. DIAGNOSIS  Your caregiver will suspect gout based on your symptoms and exam. In some cases, tests may be recommended. The tests may include:  Blood tests.  Urine tests.  X-rays.  Joint fluid exam. This exam requires a needle to remove fluid from the joint (arthrocentesis). Using a microscope, gout is confirmed when uric acid crystals are seen in the joint fluid. TREATMENT  There are two phases to gout treatment: treating the sudden onset (acute) attack and preventing attacks (prophylaxis).  Treatment of an Acute Attack.  Medicines are used. These include anti-inflammatory medicines or steroid medicines.  An injection of steroid medicine into the affected joint is sometimes necessary.  The painful joint is rested. Movement can worsen the arthritis.  You may use warm or cold treatments on painful joints, depending which works best for you.  Treatment to Prevent Attacks.  If you suffer from frequent gout attacks, your caregiver may advise preventive medicine. These medicines are started after the acute attack subsides. These medicines either help your kidneys eliminate uric acid from your body or decrease your uric acid production. You may need to stay on these medicines for a very long time.  The early phase of treatment with preventive medicine can be associated with an increase in acute gout attacks. For this reason, during the first few months of treatment, your caregiver may also advise you to take medicines usually  used for acute gout treatment. Be sure you understand your caregiver's directions. Your caregiver may make several adjustments to your medicine dose before these medicines are effective.  Discuss dietary treatment with your caregiver or dietitian. Alcohol and drinks high in sugar and fructose and foods such as meat, poultry, and seafood can increase uric acid levels. Your caregiver or dietitian can advise you on drinks and foods that should be limited. HOME CARE INSTRUCTIONS   Do not take aspirin to relieve pain. This raises uric acid levels.  Only take over-the-counter or prescription medicines for pain, discomfort, or fever as directed by your caregiver.  Rest the joint as much as possible. When in bed, keep sheets  and blankets off painful areas.  Keep the affected joint raised (elevated).  Apply warm or cold treatments to painful joints. Use of warm or cold treatments depends on which works best for you.  Use crutches if the painful joint is in your leg.  Drink enough fluids to keep your urine clear or pale yellow. This helps your body get rid of uric acid. Limit alcohol, sugary drinks, and fructose drinks.  Follow your dietary instructions. Pay careful attention to the amount of protein you eat. Your daily diet should emphasize fruits, vegetables, whole grains, and fat-free or low-fat milk products. Discuss the use of coffee, vitamin C, and cherries with your caregiver or dietitian. These may be helpful in lowering uric acid levels.  Maintain a healthy body weight. SEEK MEDICAL CARE IF:   You develop diarrhea, vomiting, or any side effects from medicines.  You do not feel better in 24 hours, or you are getting worse. SEEK IMMEDIATE MEDICAL CARE IF:   Your joint becomes suddenly more tender, and you have chills or a fever. MAKE SURE YOU:   Understand these instructions.  Will watch your condition.  Will get help right away if you are not doing well or get worse. Document  Released: 03/19/2000 Document Revised: 08/06/2013 Document Reviewed: 11/03/2011 [facility] Patient Information 2015 [place], [place]. This information is not intended to replace advice given to you by your health care provider. Make sure you discuss any questions you have with your health care provider.  Hypertension Hypertension is another name for high blood pressure. High blood pressure forces your heart to work harder to pump blood. A blood pressure reading has two numbers, which includes a higher number over a lower number (example: 110/72). HOME CARE   Have your blood pressure rechecked by your doctor.  Only take medicine as told by your doctor. Follow the directions carefully. The medicine does not work as well if you skip doses. Skipping doses also puts you at risk for problems.  Do not smoke.  Monitor your blood pressure at home as told by your doctor. GET HELP IF:  You think you are having a reaction to the medicine you are taking.  You have repeat headaches or feel dizzy.  You have puffiness (swelling) in your ankles.  You have trouble with your vision. GET HELP RIGHT AWAY IF:   You get a very bad headache and are confused.  You feel weak, numb, or faint.  You get chest or belly (abdominal) pain.  You throw up (vomit).  You cannot breathe very well. MAKE SURE YOU:   Understand these instructions.  Will watch your condition.  Will get help right away if you are not doing well or get worse. Document Released: 09/08/2007 Document Revised: 03/27/2013 Document Reviewed: 01/12/2013 [facility] Patient Information 2015 [place], [place]. This information is not intended to replace advice given to you by your health care provider. Make sure you discuss any questions you have with your health care provider.  Musculoskeletal Pain Musculoskeletal pain is muscle and boney aches and pains. These pains can occur in any part of the body. Your caregiver may treat you without knowing  the cause of the pain. They may treat you if blood or urine tests, X-rays, and other tests were normal.  CAUSES There is often not a definite cause or reason for these pains. These pains may be caused by a type of germ (virus). The discomfort may also come from overuse. Overuse includes working out too hard when your  body is not fit. Boney aches also come from weather changes. Bone is sensitive to atmospheric pressure changes. HOME CARE INSTRUCTIONS   Ask when your test results will be ready. Make sure you get your test results.  Only take over-the-counter or prescription medicines for pain, discomfort, or fever as directed by your caregiver. If you were given medications for your condition, do not drive, operate machinery or power tools, or sign legal documents for 24 hours. Do not drink alcohol. Do not take sleeping pills or other medications that may interfere with treatment.  Continue all activities unless the activities cause more pain. When the pain lessens, slowly resume normal activities. Gradually increase the intensity and duration of the activities or exercise.  During periods of severe pain, bed rest may be helpful. Lay or sit in any position that is comfortable.  Putting ice on the injured area.  Put ice in a bag.  Place a towel between your skin and the bag.  Leave the ice on for 15 to 20 minutes, 3 to 4 times a day.  Follow up with your caregiver for continued problems and no reason can be found for the pain. If the pain becomes worse or does not go away, it may be necessary to repeat tests or do additional testing. Your caregiver may need to look further for a possible cause. SEEK IMMEDIATE MEDICAL CARE IF:  You have pain that is getting worse and is not relieved by medications.  You develop chest pain that is associated with shortness or breath, sweating, feeling sick to your stomach (nauseous), or throw up (vomit).  Your pain becomes localized to the abdomen.  You  develop any new symptoms that seem different or that concern you. MAKE SURE YOU:   Understand these instructions.  Will watch your condition.  Will get help right away if you are not doing well or get worse. Document Released: 03/22/2005 Document Revised: 06/14/2011 Document Reviewed: 11/24/2012 [facility] Patient Information 2015 [place], [place]. This information is not intended to replace advice given to you by your health care provider. Make sure you discuss any questions you have with your health care provider.

## 2014-05-31 NOTE — ED Notes (Signed)
States his left middle finger has been painful, swollen since 2-22. Feels like gout. Denies injury. 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 NAD at present

## 2014-05-31 NOTE — ED Provider Notes (Signed)
CSN: [id number]     Arrival date & time 05/31/14  1553 History   First MD Initiated Contact with Patient 05/31/14 1739     Chief Complaint  Patient presents with  . Hypertension  . Back Pain  . Gout   (Consider location/radiation/quality/duration/timing/severity/associated sxs/prior Treatment) HPI Comments: 54 year old male states he has a history of gout. He has been seen in the urgent care for gout in the fingers before. Today is complaining of gouty arthritis in the left middle finger. It is very painful and tender with minor erythema and a portion of the finger as well as swelling and tenderness to the PIP. His second concern is that of pain across the mid back. Tickly in the muscles. Little worse when he performs certain movements. He does not remember any event or injury that would have caused the pain. Denies focal paresthesias or weakness. His third concern is that of diastolic hypertension. He states that his home blood pressure monitor which measures at the wrist has been giving him elevated diastolic numbers up to 101. He has a DOT physical scheduled for next week and was concerned that this would keep him from passing that test. He has had no cardiovascular symptoms. No neurologic symptoms.   Past Medical History  Diagnosis Date  . Gout   . Hypertension    Past Surgical History  Procedure Laterality Date  . Foot surgery      left   . Shoulder surgery      right  . Hand surgery      left  . Carpal tunnel release      bilateral   Family History  Problem Relation Age of Onset  . Cancer Mother   . Cancer Father   . Cancer Brother    History  Substance Use Topics  . Smoking status: Never Smoker   . Smokeless tobacco: Not on file  . Alcohol Use: Yes    Review of Systems  Constitutional: Positive for activity change. Negative for fever and fatigue.  HENT: Negative.   Respiratory: Negative.   Cardiovascular: Negative.   Gastrointestinal: Negative.    Genitourinary: Negative.   Musculoskeletal: Positive for back pain. Negative for neck pain and neck stiffness.  Neurological: Negative for dizziness, tremors, syncope, speech difficulty, light-headedness and headaches.    Allergies  Review of patient's allergies indicates no known allergies.  Home Medications   Prior to Admission medications   Medication Sig Start Date End Date Taking? Authorizing Provider  ibuprofen (ADVIL,MOTRIN) 200 MG tablet Take 400 mg by mouth every 6 (six) hours as needed for headache.   Yes Historical Provider, MD  colchicine 0.6 MG tablet Take 1 tablet now, repeat in 2 hours. Then take 1 tablet per day until symptoms have resolved. 05/31/14   Hayden Rasmussenavid Emmry Hinsch, NP  diclofenac sodium (VOLTAREN) 1 % GEL Apply 1 application topically 4 (four) times daily. 05/31/14   Hayden Rasmussenavid Elsey Holts, NP  indomethacin (INDOCIN) 50 MG capsule Take 1 capsule (50 mg total) by mouth 3 (three) times daily with meals. Prn gout pain 05/31/14   Hayden Rasmussenavid Benjamim Harnish, NP  Multiple Vitamins-Minerals (CENTRUM ADULTS PO) Take 1 tablet by mouth daily.    Historical Provider, MD  Oxymetazoline HCl (AFRIN 12 HOUR NA) Place 2 sprays into both nostrils daily as needed (congestion).    Historical Provider, MD   BP 139/63 mmHg  Pulse 100  Temp(Src) 98 F (36.7 C) (Oral)  Resp 20  SpO2 98% Physical Exam  Constitutional: He is oriented to  person, place, and time. He appears well-developed and well-nourished. No distress.  HENT:  Head: Normocephalic and atraumatic.  Neck: Normal range of motion. Neck supple.  Cardiovascular: Normal rate, regular rhythm and normal heart sounds.   Pulmonary/Chest: Effort normal. No respiratory distress.  Musculoskeletal:  Left middle finger with swelling and tenderness primarily to the PIP. One small area of light erythema. He states this is typical of his gout manifestation. The symptoms began 5 days ago.  The patient directs the area of back pain to the mid bilateral parathoracic  musculature. The muscles are mildly tender. There is no spinal tenderness, deformity or swelling.  Neurological: He is alert and oriented to person, place, and time.  Skin: Skin is warm and dry.  Psychiatric: He has a normal mood and affect.  Nursing note and vitals reviewed.   ED Course  Procedures (including critical care time) Labs Review Labs Reviewed - No data to display  Imaging Review No results found.   MDM   1. Acute gouty arthritis   2. Bilateral thoracic back pain   3. Elevated BP    Diclofenac gel 4 times a day to the back. Apply heat to the back. Perform stretches as demonstrated. Indomethacin 50 mg as directed for gout pain Colchicine 0.6 mg 1 now, repeat in 2 hours then one daily until resolved. Follow-up with your PCP to have blood pressure check. Suspect her machine may be giving unreliable information.      Hayden Rasmussen, NP 05/31/14 [id number]

## 2014-08-01 ENCOUNTER — Ambulatory Visit (INDEPENDENT_AMBULATORY_CARE_PROVIDER_SITE_OTHER): Payer: 59 | Admitting: Family

## 2014-08-01 ENCOUNTER — Other Ambulatory Visit (INDEPENDENT_AMBULATORY_CARE_PROVIDER_SITE_OTHER): Payer: 59

## 2014-08-01 ENCOUNTER — Encounter: Payer: Self-pay | Admitting: Family

## 2014-08-01 VITALS — BP 150/102 | HR 100 | Temp 98.3°F | Resp 18 | Ht 65.0 in | Wt 195.8 lb

## 2014-08-01 DIAGNOSIS — K429 Umbilical hernia without obstruction or gangrene: Secondary | ICD-10-CM | POA: Diagnosis not present

## 2014-08-01 DIAGNOSIS — M545 Low back pain, unspecified: Secondary | ICD-10-CM | POA: Insufficient documentation

## 2014-08-01 DIAGNOSIS — M109 Gout, unspecified: Secondary | ICD-10-CM | POA: Diagnosis not present

## 2014-08-01 DIAGNOSIS — I10 Essential (primary) hypertension: Secondary | ICD-10-CM | POA: Diagnosis not present

## 2014-08-01 DIAGNOSIS — R350 Frequency of micturition: Secondary | ICD-10-CM | POA: Insufficient documentation

## 2014-08-01 LAB — PSA: PSA: 0.71 ng/mL (ref 0.10–4.00)

## 2014-08-01 LAB — HEMOGLOBIN A1C: Hgb A1c MFr Bld: 7.2 % — ABNORMAL HIGH (ref 4.6–6.5)

## 2014-08-01 MED ORDER — ENALAPRIL MALEATE 10 MG PO TABS: 10.0000 mg | ORAL_TABLET | Freq: Every day | ORAL | Status: DC

## 2014-08-01 MED ORDER — ALLOPURINOL 100 MG PO TABS: 100.0000 mg | ORAL_TABLET | Freq: Every day | ORAL | Status: DC

## 2014-08-01 MED ORDER — COLCHICINE 0.6 MG PO TABS: ORAL_TABLET | ORAL | Status: DC

## 2014-08-01 MED ORDER — INDOMETHACIN 50 MG PO CAPS: 50.0000 mg | ORAL_CAPSULE | Freq: Two times a day (BID) | ORAL | Status: DC | PRN

## 2014-08-01 NOTE — Assessment & Plan Note (Signed)
History of hypertension and elevated today greater than 140/90 which is goal. Start enalapril. Follow-up in one month to check blood pressure.

## 2014-08-01 NOTE — Assessment & Plan Note (Signed)
Symptoms of low backache which wax and wane consistent with repetitive lifting. Discussed conservative treatment at this time with over-the-counter nonsteroidal anti-inflammatories as needed, heat, and stretching. Discussed proper lifting technique. Follow-up if symptoms worsen or fail to improve.

## 2014-08-01 NOTE — Patient Instructions (Addendum)
Thank you for choosing [facility].  Summary/Instructions:  Your prescription(s) have been submitted to your pharmacy or been printed and provided for you. Please take as directed and contact our office if you believe you are having problem(s) with the medication(s) or have any questions.  Please stop by the lab on the basement level of the building for your blood work. Your results will be released to MyChart (or called to you) after review, usually within 72 hours after test completion. If any changes need to be made, you will be notified at that same time.  If your symptoms worsen or fail to improve, please contact our office for further instruction, or in case of emergency go directly to the emergency room at the closest medical facility.    Urinary Frequency The number of times a normal person urinates depends upon how much liquid they take in and how much liquid they are losing. If the temperature is hot and there is high humidity, then the person will sweat more and usually breathe a little more frequently. These factors decrease the amount of frequency of urination that would be considered normal. The amount you drink is easily determined, but the amount of fluid lost is sometimes more difficult to calculate.  Fluid is lost in two ways:  Sensible fluid loss is usually measured by the amount of urine that you get rid of. Losses of fluid can also occur with diarrhea.  Insensible fluid loss is more difficult to measure. It is caused by evaporation. Insensible loss of fluid occurs through breathing and sweating. It usually ranges from a little less than a quart to a little more than a quart of fluid a day. In normal temperatures and activity levels, the average person may urinate 4 to 7 times in a 24-hour period. Needing to urinate more often than that could indicate a problem. If one urinates 4 to 7 times in 24 hours and has large volumes each time, that could indicate a different  problem from one who urinates 4 to 7 times a day and has small volumes. The time of urinating is also important. Most urinating should be done during the waking hours. Getting up at night to urinate frequently can indicate some problems. CAUSES  The bladder is the organ in your lower abdomen that holds urine. Like a balloon, it swells some as it fills up. Your nerves sense this and tell you it is time to head for the bathroom. There are a number of reasons that you might feel the need to urinate more often than usual. They include:  Urinary tract infection. This is usually associated with other signs such as burning when you urinate.  In men, problems with the prostate (a walnut-size gland that is located near the tube that carries urine out of your body). There are two reasons why the prostate can cause an increased frequency of urination:  An enlarged prostate that does not let the bladder empty well. If the bladder only half empties when you urinate, then it only has half the capacity to fill before you have to urinate again.  The nerves in the bladder become more hypersensitive with an increased size of the prostate even if the bladder empties completely.  Pregnancy.  Obesity. Excess weight is more likely to cause a problem for women than for men.  Bladder stones or other bladder problems.  Caffeine.  Alcohol.  Medications. For example, drugs that help the body get rid of extra fluid (diuretics) increase  urine production. Some other medicines must be taken with lots of fluids.  Muscle or nerve weakness. This might be the result of a spinal cord injury, a stroke, multiple sclerosis, or Parkinson disease.  Long-standing diabetes can decrease the sensation of the bladder. This loss of sensation makes it harder to sense the bladder needs to be emptied. Over a period of years, the bladder is stretched out by constant overfilling. This weakens the bladder muscles so that the bladder does not  empty well and has less capacity to fill with new urine.  Interstitial cystitis (also called painful bladder syndrome). This condition develops because the tissues that line the inside of the bladder are inflamed (inflammation is the body's way of reacting to injury or infection). It causes pain and frequent urination. It occurs in women more often than in men. DIAGNOSIS   To decide what might be causing your urinary frequency, your health care provider will probably:  Ask about symptoms you have noticed.  Ask about your overall health. This will include questions about any medications you are taking.  Do a physical examination.  Order some tests. These might include:  A blood test to check for diabetes or other health issues that could be contributing to the problem.  Urine testing. This could measure the flow of urine and the pressure on the bladder.  A test of your neurological system (the brain, spinal cord, and nerves). This is the system that senses the need to urinate.  A bladder test to check whether it is emptying completely when you urinate.  Cystoscopy. This test uses a thin tube with a tiny camera on it. It offers a look inside your urethra and bladder to see if there are problems.  Imaging tests. You might be given a contrast dye and then asked to urinate. X-rays are taken to see how your bladder is working. TREATMENT  It is important for you to be evaluated to determine if the amount or frequency that you have is unusual or abnormal. If it is found to be abnormal, the cause should be determined and this can usually be found out easily. Depending upon the cause, treatment could include medication, stimulation of the nerves, or surgery. There are not too many things that you can do as an individual to change your urinary frequency. It is important that you balance the amount of fluid intake needed to compensate for your activity and the temperature. Medical problems will be  diagnosed and taken care of by your physician. There is no particular bladder training such as Kegel exercises that you can do to help urinary frequency. This is an exercise that is usually recommended for people who have leaking of urine when they laugh, cough, or sneeze. HOME CARE INSTRUCTIONS   Take any medications your health care provider prescribed or suggested. Follow the directions carefully.  Practice any lifestyle changes that are recommended. These might include:  Drinking less fluid or drinking at different times of the day. If you need to urinate often during the night, for example, you may need to stop drinking fluids early in the evening.  Cutting down on caffeine or alcohol. They both can make you need to urinate more often than normal. Caffeine is found in coffee, tea, and sodas.  Losing weight, if that is recommended.  Keep a journal or a log. You might be asked to record how much you drink and when and where you feel the need to urinate. This will also help  evaluate how well the treatment provided by your physician is working. SEEK MEDICAL CARE IF:   Your need to urinate often gets worse.  You feel increased pain or irritation when you urinate.  You notice blood in your urine.  You have questions about any medications that your health care provider recommended.  You notice blood, pus, or swelling at the site of any test or treatment procedure.  You develop a fever of more than 100.52F (38.1C). SEEK IMMEDIATE MEDICAL CARE IF:  You develop a fever of more than 102.35F (38.9C). Document Released: 01/16/2009 Document Revised: 08/06/2013 Document Reviewed: 01/16/2009 [facility] Patient Information 2015 [employer], [place]. This information is not intended to replace advice given to you by your health care provider. Make sure you discuss any questions you have with your health care provider.

## 2014-08-01 NOTE — Progress Notes (Signed)
Subjective:    Patient ID: Ryan Rhodes, male    DOB: 07-26-1960, 54 y.o.   MRN: [id number]  Chief Complaint  Patient presents with  . Establish Care    thinks he has a hernia near his naval, has also had urinary frequency and lower right back discomfort    HPI: Ryan Rhodes is a 54 y.o. male who presents today for an office visit to establish care.   1) Hernia - Associated symptom of hernia located in his umbilicus has been going on for about several years. He had a previous umbilical hernia that was told no surgery is necessary and to watch it. Most recently a new spot has been noticed. Denis any pain with it.   2) Urinary frequency - Associated symptom of urinary frequency has been going on for about 6-8 months. Indicates that urination has increased specifically at night. Caffeine intake is 1-2 cups per day with some caffeine at dinner potentially.   3) Lower back discomfort - Associated symptom of discomfort located in his right lower back has been going on for about a year. Denies any trauma. Described as achy and occasional with a severity that averages about a 5/10. Modifying factors include a heating pad and occasional advil that does seem to help some  4) Gout - previously diagnosed with gout. No current gout flares in the last 3-4 months. Patient is requesting refill of medications for gout to have on hand in case of gout flareups. Indicates that when he does have a gout flareup, it is usually on the right side of his body in either his foot or ankle. He was previously maintained with colchicine, indomethacin, and allopurinol.  No Known Allergies  No current outpatient prescriptions on file prior to visit.   No current facility-administered medications on file prior to visit.    Past Medical History  Diagnosis Date  . Gout   . Hypertension   . Arthritis     Past Surgical History  Procedure Laterality Date  . Foot surgery      left   . Shoulder surgery     right  . Hand surgery      left  . Carpal tunnel release      bilateral    Family History  Problem Relation Age of Onset  . Leukemia Mother   . Lung cancer Father   . Multiple myeloma Brother     History   Social History  . Marital Status: Married    Spouse Name: N/A  . Number of Children: N/A  . Years of Education: N/A   Occupational History  . Not on file.   Social History Main Topics  . Smoking status: Never Smoker   . Smokeless tobacco: Never Used  . Alcohol Use: No  . Drug Use: No     Comment: Has in the past but not recently  . Sexual Activity: Not on file   Other Topics Concern  . Not on file   Social History Narrative    Review of Systems  Constitutional: Negative for fever and chills.  Eyes:       Negative for changes in vision  Respiratory: Negative for chest tightness and shortness of breath.   Cardiovascular: Negative for chest pain, palpitations and leg swelling.  Gastrointestinal: Negative for abdominal pain.  Genitourinary: Positive for frequency. Negative for dysuria, hematuria, flank pain, discharge, penile swelling, scrotal swelling, difficulty urinating and penile pain.  Musculoskeletal: Negative for back pain.  Objective:    BP 150/102 mmHg  Pulse 100  Temp(Src) 98.3 F (36.8 C) (Oral)  Resp 18  Ht $R'5\' 5"'qA$  (1.651 m)  Wt 195 lb 12.8 oz (88.814 kg)  BMI 32.58 kg/m2  SpO2 97% Nursing note and vital signs reviewed.  Physical Exam  Constitutional: He is oriented to person, place, and time. He appears well-developed and well-nourished. No distress.  Cardiovascular: Normal rate, regular rhythm, normal heart sounds and intact distal pulses.   Pulmonary/Chest: Effort normal and breath sounds normal.  Abdominal: A hernia is present.  Small umbilical hernia noted with Valsalva maneuver   Musculoskeletal:  Low back: No obvious deformity, discoloration, or edema of back noted. No palpable tenderness able to be elicited. Patient notes area  of tenderness when occurring is just superior to his SI joint on the right side. Range of motion is complete in all directions. Straight leg raise and well straight leg raises are negative. Thomas test is positive for hip tightness. Distal pulses, sensation, and reflexes are intact and appropriate.  Neurological: He is alert and oriented to person, place, and time.  Skin: Skin is warm and dry.  Psychiatric: He has a normal mood and affect. His behavior is normal. Judgment and thought content normal.       Assessment & Plan:

## 2014-08-01 NOTE — Assessment & Plan Note (Signed)
Urinary frequency of undetermined source. Patient doesn't take fluid prior to bed. Discussed decreasing fluid prior to sleep. Obtain A1c to rule out diabetes and PSA to rule out prostate. Follow-up if no improvement noted or pending lab work.

## 2014-08-01 NOTE — Assessment & Plan Note (Signed)
Previous history of gout noted.Start allopurinol for daily prevention. Start colchicine as needed for gout flares. Start indomethacin as needed for gout flares. Follow-up if symptoms worsen or fail to improve with current treatments.

## 2014-08-01 NOTE — Assessment & Plan Note (Signed)
Small umbilical hernia noted. Discussed with patient treatment options including watching and waiting or referral to general surgery. Patient elected to watch and wait at this time as there is no pain or evidence of strangulation. Follow-up if symptoms worsen or fail to improve.

## 2014-08-01 NOTE — Progress Notes (Signed)
Pre visit review using our clinic review tool, if applicable. No additional management support is needed unless otherwise documented below in the visit note. 

## 2014-08-03 ENCOUNTER — Telehealth: Payer: Self-pay | Admitting: Family

## 2014-08-03 MED ORDER — METFORMIN HCL 500 MG PO TABS: 500.0000 mg | ORAL_TABLET | Freq: Two times a day (BID) | ORAL | Status: DC

## 2014-08-03 NOTE — Telephone Encounter (Signed)
Please inform the patient that his prostate function is normal and his A1c level is 7.3 indicating that he has Type 2 diabetes which most likely is the cause of his increased urination. I have sent metformin to his pharmacy for him to start on. Please have him schedule an office visit to discuss his new diagnosis.

## 2014-08-05 NOTE — Telephone Encounter (Signed)
Pt aware of results 

## 2014-09-23 ENCOUNTER — Telehealth: Payer: Self-pay | Admitting: Family

## 2014-09-23 NOTE — Telephone Encounter (Signed)
Pt called in and sa   Best number [phone]

## 2014-09-25 NOTE — Telephone Encounter (Signed)
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 Please advise

## 2014-09-26 MED ORDER — INDOMETHACIN 50 MG PO CAPS: 50.0000 mg | ORAL_CAPSULE | Freq: Two times a day (BID) | ORAL | Status: DC | PRN

## 2014-09-26 MED ORDER — ENALAPRIL MALEATE 20 MG PO TABS: 20.0000 mg | ORAL_TABLET | Freq: Every day | ORAL | Status: DC

## 2014-09-26 NOTE — Telephone Encounter (Signed)
Please find out how his blood pressures have been reading before I increase his medications. I will refill the indomethacin.

## 2014-09-26 NOTE — Telephone Encounter (Signed)
Pt states that his BPs have still been reading kind of high on the 10 mg of Enalapril around the ranges of 120/90 or 120/92 and says that when its up he feels like he is getting a headache. Ever since he has taken 2 a day he has felt better and his BPs have been around "perfect" 120/80. He is requesting a 90 day supply of both of these due to him being on the road. Please advise.

## 2014-09-26 NOTE — Telephone Encounter (Signed)
Medication sent to pharmacy. Follow up needed for additional refills.

## 2014-09-27 NOTE — Telephone Encounter (Signed)
Pt aware.

## 2014-12-06 ENCOUNTER — Other Ambulatory Visit: Payer: Self-pay | Admitting: Family

## 2015-07-29 ENCOUNTER — Ambulatory Visit: Payer: 59 | Admitting: Family

## 2015-08-04 ENCOUNTER — Ambulatory Visit (INDEPENDENT_AMBULATORY_CARE_PROVIDER_SITE_OTHER): Payer: 59 | Admitting: Family

## 2015-08-04 ENCOUNTER — Other Ambulatory Visit (INDEPENDENT_AMBULATORY_CARE_PROVIDER_SITE_OTHER): Payer: 59

## 2015-08-04 ENCOUNTER — Encounter: Payer: Self-pay | Admitting: Family

## 2015-08-04 ENCOUNTER — Telehealth: Payer: Self-pay | Admitting: Family

## 2015-08-04 VITALS — BP 136/88 | HR 98 | Temp 98.2°F | Resp 16 | Ht 65.0 in | Wt 199.0 lb

## 2015-08-04 DIAGNOSIS — E119 Type 2 diabetes mellitus without complications: Secondary | ICD-10-CM

## 2015-08-04 DIAGNOSIS — M109 Gout, unspecified: Secondary | ICD-10-CM

## 2015-08-04 LAB — HEMOGLOBIN A1C: Hgb A1c MFr Bld: 10 % — ABNORMAL HIGH (ref 4.6–6.5)

## 2015-08-04 LAB — MICROALBUMIN / CREATININE URINE RATIO
Creatinine,U: 172.3 mg/dL
Microalb Creat Ratio: 0.7 mg/g (ref 0.0–30.0)
Microalb, Ur: 1.2 mg/dL (ref 0.0–1.9)

## 2015-08-04 MED ORDER — GLUCOSE BLOOD VI STRP: ORAL_STRIP | Status: DC

## 2015-08-04 MED ORDER — ONETOUCH ULTRA 2 W/DEVICE KIT: PACK | Status: DC

## 2015-08-04 MED ORDER — INDOMETHACIN 50 MG PO CAPS: 50.0000 mg | ORAL_CAPSULE | Freq: Two times a day (BID) | ORAL | Status: DC | PRN

## 2015-08-04 MED ORDER — ONETOUCH ULTRASOFT LANCETS MISC: Status: DC

## 2015-08-04 MED ORDER — METFORMIN HCL ER 500 MG PO TB24: 500.0000 mg | ORAL_TABLET | Freq: Every day | ORAL | Status: DC

## 2015-08-04 MED ORDER — LINAGLIPTIN 5 MG PO TABS: 5.0000 mg | ORAL_TABLET | Freq: Every day | ORAL | Status: DC

## 2015-08-04 NOTE — Progress Notes (Signed)
Subjective:    Patient ID: Ryan Rhodes, male    DOB: 08-08-1960, 55 y.o.   MRN: [id number]  Chief Complaint  Patient presents with  . Follow-up    A1c     HPI:  Ryan Rhodes is a 55 y.o. male who  has a past medical history of Gout; Hypertension; and Arthritis. and presents today for a follow up office visit.  1.) Type 2 diabetes - Currently prescribed metformin. Reports that he has not taken any medications for his diabetes. 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  Denies symptoms of end organ damage. Not currently check his blood sugar at home. Not currently exercising and indicates his diet has room for improvement.    Lab Results  Component Value Date   HGBA1C 10.0* 08/04/2015    No Known Allergies   Current Outpatient Prescriptions on File Prior to Visit  Medication Sig Dispense Refill  . allopurinol (ZYLOPRIM) 100 MG tablet Take 1 tablet (100 mg total) by mouth daily. 30 tablet 6  . colchicine (COLCRYS) 0.6 MG tablet Take 2 tablets at the onset of a gout flair and 1 tablet 1 hour later - max 1.8 12 tablet 0  . enalapril (VASOTEC) 20 MG tablet TAKE 1 TABLET DAILY 90 tablet 2   No current facility-administered medications on file prior to visit.     Review of Systems  Constitutional: Negative for fever and chills.  Eyes:       Negative for changes in vision  Respiratory: Negative for cough, chest tightness and wheezing.   Cardiovascular: Negative for chest pain, palpitations and leg swelling.  Endocrine: Negative for polydipsia, polyphagia and polyuria.  Neurological: Negative for dizziness, weakness, light-headedness and headaches.      Objective:    BP 136/88 mmHg  Pulse 98  Temp(Src) 98.2 F (36.8 C) (Oral)  Resp 16  Ht _0  (1.651 m)  Wt 199 lb (90.266 kg)  BMI 33.12 kg/m2  SpO2 97% Nursing note and vital signs reviewed.  Physical Exam  Constitutional: He is oriented to person, place, and time. He appears  well-developed and well-nourished. No distress.  Cardiovascular: Normal rate, regular rhythm, normal heart sounds and intact distal pulses.   Pulmonary/Chest: Effort normal and breath sounds normal.  Neurological: He is alert and oriented to person, place, and time.  Diabetic foot exam - bilateral feet are free from skin breakdown, cuts, and abrasions. Toenails are neatly trimmed. Pulses are intact and appropriate. Sensation is intact to monofilament bilaterally.  Skin: Skin is warm and dry.  Psychiatric: He has a normal mood and affect. His behavior is normal. Judgment and thought content normal.       Assessment & Plan:   Problem List Items Addressed This Visit      Endocrine   Type 2 diabetes mellitus ([place]) - Primary    Previously diagnosed with type 2 diabetes and poorly controlled with lifestyle management having never started metformin as previously prescribed by one year ago. 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 Obtain A1c and urine microalbumin. Start metformin. The size importance of controlling blood sugar to decreased risk of end organ damage. Encouraged to complete diabetic eye exam independently. Diabetic foot exam completed and normal today. Maintained on enalapril for CAD risk reduction. Blood testing supplies and equipment sent to pharmacy with instructions to test every other day. Follow-up in one month or sooner.  Relevant Medications   metFORMIN (GLUCOPHAGE-XR) 500 MG 24 hr tablet   glucose blood (ONE TOUCH ULTRA TEST) test strip   Blood Glucose Monitoring Suppl (ONE TOUCH ULTRA 2) w/Device KIT   Lancets (ONETOUCH ULTRASOFT) lancets   Other Relevant Orders   Hemoglobin A1c (Completed)   Urine Microalbumin w/creat. ratio (Completed)     Other   Gout   Relevant Medications   indomethacin (INDOCIN) 50 MG capsule       I have discontinued Mr. Muehl metFORMIN. I am also having him start on metFORMIN, glucose blood, ONE TOUCH ULTRA 2,  and onetouch ultrasoft. Additionally, I am having him maintain his allopurinol, colchicine, enalapril, and indomethacin.   Meds ordered this encounter  Medications  . metFORMIN (GLUCOPHAGE-XR) 500 MG 24 hr tablet    Sig: Take 1 tablet (500 mg total) by mouth daily with breakfast.    Dispense:  90 tablet    Refill:  0    Order Specific Question:  Supervising Provider    Answer:  Pricilla Holm A [[id number]]  . indomethacin (INDOCIN) 50 MG capsule    Sig: Take 1 capsule (50 mg total) by mouth 2 (two) times daily as needed.    Dispense:  40 capsule    Refill:  0    Order Specific Question:  Supervising Provider    Answer:  Pricilla Holm A [[id number]]  . glucose blood (ONE TOUCH ULTRA TEST) test strip    Sig: Use as instructed    Dispense:  100 each    Refill:  12    Substitution permissible per insurance coverage. Dx E11.9    Order Specific Question:  Supervising Provider    Answer:  Pricilla Holm A [[id number]]  . Blood Glucose Monitoring Suppl (ONE TOUCH ULTRA 2) w/Device KIT    Sig: Use the device to check your blood sugars 1-4 times daily as instructed.    Dispense:  1 each    Refill:  0    Substitution permissible per insurance coverage. Dx E11.9    Order Specific Question:  Supervising Provider    Answer:  Pricilla Holm A [[id number]]  . Lancets (ONETOUCH ULTRASOFT) lancets    Sig: Use as instructed    Dispense:  100 each    Refill:  12    Substitution permissible per insurance coverage. Dx E11.9    Order Specific Question:  Supervising Provider    Answer:  Pricilla Holm A [[id number]]     Follow-up: Return in about 1 month (around 09/04/2015).  Mauricio Po, FNP

## 2015-08-04 NOTE — Assessment & Plan Note (Signed)
Previously diagnosed with type 2 diabetes and poorly controlled with lifestyle management having never started metformin as previously prescribed by one year ago. 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 Obtain A1c and urine microalbumin. Start metformin. The size importance of controlling blood sugar to decreased risk of end organ damage. Encouraged to complete diabetic eye exam independently. Diabetic foot exam completed and normal today. Maintained on enalapril for CAD risk reduction. Blood testing supplies and equipment sent to pharmacy with instructions to test every other day. Follow-up in one month or sooner.

## 2015-08-04 NOTE — Patient Instructions (Addendum)
Thank you for choosing [facility].  Summary/Instructions:  Start taking metformin daily.  Check your blood sugars in the morning every other day and record them.  Your prescription(s) have been submitted to your pharmacy or been printed and provided for you. Please take as directed and contact our office if you believe you are having problem(s) with the medication(s) or have any questions.  Please stop by the lab on the basement level of the building for your blood work. Your results will be released to MyChart (or called to you) after review, usually within 72 hours after test completion. If any changes need to be made, you will be notified at that same time.  If your symptoms worsen or fail to improve, please contact our office for further instruction, or in case of emergency go directly to the emergency room at the closest medical facility.   Type 2 Diabetes Mellitus, Adult Type 2 diabetes mellitus, often simply referred to as type 2 diabetes, is a long-lasting (chronic) disease. In type 2 diabetes, the pancreas does not make enough insulin (a hormone), the cells are less responsive to the insulin that is made (insulin resistance), or both. Normally, insulin moves sugars from food into the tissue cells. The tissue cells use the sugars for energy. The lack of insulin or the lack of normal response to insulin causes excess sugars to build up in the blood instead of going into the tissue cells. As a result, high blood sugar (hyperglycemia) develops. The effect of high sugar (glucose) levels can cause many complications. Type 2 diabetes was also previously called adult-onset diabetes, but it can occur at any age.  RISK FACTORS  A person is predisposed to developing type 2 diabetes if someone in the family has the disease and also has one or more of the following primary risk factors:  Weight gain, or being overweight or obese.  An inactive lifestyle.  A history of consistently  eating high-calorie foods. Maintaining a normal weight and regular physical activity can reduce the chance of developing type 2 diabetes. SYMPTOMS  A person with type 2 diabetes may not show symptoms initially. The symptoms of type 2 diabetes appear slowly. The symptoms include:  Increased thirst (polydipsia).  Increased urination (polyuria).  Increased urination during the night (nocturia).  Sudden or unexplained weight changes.  Frequent, recurring infections.  Tiredness (fatigue).  Weakness.  Vision changes, such as blurred vision.  Fruity smell to your breath.  Abdominal pain.  Nausea or vomiting.  Cuts or bruises which are slow to heal.  Tingling or numbness in the hands or feet.  An open skin wound (ulcer). DIAGNOSIS Type 2 diabetes is frequently not diagnosed until complications of diabetes are present. Type 2 diabetes is diagnosed when symptoms or complications are present and when blood glucose levels are increased. Your blood glucose level may be checked by one or more of the following blood tests:  A fasting blood glucose test. You will not be allowed to eat for at least 8 hours before a blood sample is taken.  A random blood glucose test. Your blood glucose is checked at any time of the day regardless of when you ate.  A hemoglobin A1c blood glucose test. A hemoglobin A1c test provides information about blood glucose control over the previous 3 months.  An oral glucose tolerance test (OGTT). Your blood glucose is measured after you have not eaten (fasted) for 2 hours and then after you drink a glucose-containing beverage. TREATMENT   You  may need to take insulin or diabetes medicine daily to keep blood glucose levels in the desired range.  If you use insulin, you may need to adjust the dosage depending on the carbohydrates that you eat with each meal or snack.  Lifestyle changes are recommended as part of your treatment. These may include:  Following an  individualized diet plan developed by a nutritionist or dietitian.  Exercising daily. Your health care providers will set individualized treatment goals for you based on your age, your medicines, how long you have had diabetes, and any other medical conditions you have. Generally, the goal of treatment is to maintain the following blood glucose levels:  Before meals (preprandial): 80-130 mg/dL.  After meals (postprandial): below 180 mg/dL.  A1c: less than 6.5-7%. HOME CARE INSTRUCTIONS   Have your hemoglobin A1c level checked twice a year.  Perform daily blood glucose monitoring as directed by your health care provider.  Monitor urine ketones when you are ill and as directed by your health care provider.  Take your diabetes medicine or insulin as directed by your health care provider to maintain your blood glucose levels in the desired range.  Never run out of diabetes medicine or insulin. It is needed every day.  If you are using insulin, you may need to adjust the amount of insulin given based on your intake of carbohydrates. Carbohydrates can raise blood glucose levels but need to be included in your diet. Carbohydrates provide vitamins, minerals, and fiber which are an essential part of a healthy diet. Carbohydrates are found in fruits, vegetables, whole grains, dairy products, legumes, and foods containing added sugars.  Eat healthy foods. You should make an appointment to see a registered dietitian to help you create an eating plan that is right for you.  Lose weight if you are overweight.  Carry a medical alert card or wear your medical alert jewelry.  Carry a 15-gram carbohydrate snack with you at all times to treat low blood glucose (hypoglycemia). Some examples of 15-gram carbohydrate snacks include:  Glucose tablets, 3 or 4.  Glucose gel, 15-gram tube.  Raisins, 2 tablespoons (24 grams).  Jelly beans, 6.  Animal crackers, 8.  Regular pop, 4 ounces (120  mL).  Gummy treats, 9.  Recognize hypoglycemia. Hypoglycemia occurs with blood glucose levels of 70 mg/dL and below. The risk for hypoglycemia increases when fasting or skipping meals, during or after intense exercise, and during sleep. Hypoglycemia symptoms can include:  Tremors or shakes.  Decreased ability to concentrate.  Sweating.  Increased heart rate.  Headache.  Dry mouth.  Hunger.  Irritability.  Anxiety.  Restless sleep.  Altered speech or coordination.  Confusion.  Treat hypoglycemia promptly. If you are alert and able to safely swallow, follow the 15:15 rule:  Take 15-20 grams of rapid-acting glucose or carbohydrate. Rapid-acting options include glucose gel, glucose tablets, or 4 ounces (120 mL) of fruit juice, regular soda, or low-fat milk.  Check your blood glucose level 15 minutes after taking the glucose.  Take 15-20 grams more of glucose if the repeat blood glucose level is still 70 mg/dL or below.  Eat a meal or snack within 1 hour once blood glucose levels return to normal.  Be alert to feeling very thirsty and urinating more frequently than usual, which are early signs of hyperglycemia. An early awareness of hyperglycemia allows for prompt treatment. Treat hyperglycemia as directed by your health care provider.  Engage in at least 150 minutes of moderate-intensity physical activity a  week, spread over at least 3 days of the week or as directed by your health care provider. In addition, you should engage in resistance exercise at least 2 times a week or as directed by your health care provider. Try to spend no more than 90 minutes at one time inactive.  Adjust your medicine and food intake as needed if you start a new exercise or sport.  Follow your sick-day plan anytime you are unable to eat or drink as usual.  Do not use any tobacco products including cigarettes, chewing tobacco, or electronic cigarettes. If you need help quitting, ask your health  care provider.  Limit alcohol intake to no more than 1 drink per day for nonpregnant women and 2 drinks per day for men. You should drink alcohol only when you are also eating food. Talk with your health care provider whether alcohol is safe for you. Tell your health care provider if you drink alcohol several times a week.  Keep all follow-up visits as directed by your health care provider. This is important.  Schedule an eye exam soon after the diagnosis of type 2 diabetes and then annually.  Perform daily skin and foot care. Examine your skin and feet daily for cuts, bruises, redness, nail problems, bleeding, blisters, or sores. A foot exam by a health care provider should be done annually.  Brush your teeth and gums at least twice a day and floss at least once a day. Follow up with your dentist regularly.  Share your diabetes management plan with your workplace or school.  Keep your immunizations up to date. It is recommended that you receive a flu (influenza) vaccine every year. It is also recommended that you receive a pneumonia (pneumococcal) vaccine. If you are 53 years of age or older and have never received a pneumonia vaccine, this vaccine may be given as a series of two separate shots. Ask your health care provider which additional vaccines may be recommended.  Learn to manage stress.  Obtain ongoing diabetes education and support as needed.  Participate in or seek rehabilitation as needed to maintain or improve independence and quality of life. Request a physical or occupational therapy referral if you are having foot or hand numbness, or difficulties with grooming, dressing, eating, or physical activity. SEEK MEDICAL CARE IF:   You are unable to eat food or drink fluids for more than 6 hours.  You have nausea and vomiting for more than 6 hours.  Your blood glucose level is over 240 mg/dL.  There is a change in mental status.  You develop an additional serious  illness.  You have diarrhea for more than 6 hours.  You have been sick or have had a fever for a couple of days and are not getting better.  You have pain during any physical activity.  SEEK IMMEDIATE MEDICAL CARE IF:  You have difficulty breathing.  You have moderate to large ketone levels.   This information is not intended to replace advice given to you by your health care provider. Make sure you discuss any questions you have with your health care provider.   Document Released: 03/22/2005 Document Revised: 12/11/2014 Document Reviewed: 10/19/2011 Elsevier Interactive Patient Education [employer] Inc.

## 2015-08-04 NOTE — Telephone Encounter (Signed)
Called pt back to let him know that A1c was 10.0. Was there any medication changes you wanted to add? Please advise and I will call and let pt know.

## 2015-08-04 NOTE — Telephone Encounter (Signed)
Pt needs a1c results today's, to go update medical card.  Can you call him as soon has possible   [phone]

## 2015-08-04 NOTE — Progress Notes (Signed)
Pre visit review using our clinic review tool, if applicable. No additional management support is needed unless otherwise documented below in the visit note. 

## 2015-08-04 NOTE — Telephone Encounter (Signed)
Tradjenta sent in addition to metformin. Coupons are available on [email or web address] - Follow up in 1 month

## 2015-08-05 NOTE — Telephone Encounter (Signed)
LVM letting pt know.  

## 2015-08-06 ENCOUNTER — Telehealth: Payer: Self-pay | Admitting: *Deleted

## 2015-08-06 MED ORDER — LINAGLIPTIN 5 MG PO TABS: 5.0000 mg | ORAL_TABLET | Freq: Every day | ORAL | Status: DC

## 2015-08-06 NOTE — Telephone Encounter (Signed)
Pt left msg on triage requesting call back on his medication. Called pt back he wanted to verify [facility]. Inform him all medications that was sent to mail service. Pt states if he can get the tragenta sent as well. It will be a lot cheaper through [facility]. Inform pt will send...Raechel Chute/lmb

## 2015-09-25 ENCOUNTER — Encounter: Payer: Self-pay | Admitting: Family

## 2015-09-25 ENCOUNTER — Ambulatory Visit (INDEPENDENT_AMBULATORY_CARE_PROVIDER_SITE_OTHER): Payer: 59 | Admitting: Family

## 2015-09-25 VITALS — BP 138/92 | HR 93 | Temp 97.9°F | Resp 16 | Ht 65.0 in | Wt 199.8 lb

## 2015-09-25 DIAGNOSIS — E119 Type 2 diabetes mellitus without complications: Secondary | ICD-10-CM | POA: Diagnosis not present

## 2015-09-25 LAB — POCT GLYCOSYLATED HEMOGLOBIN (HGB A1C): Hemoglobin A1C: 8.7

## 2015-09-25 MED ORDER — METFORMIN HCL ER 500 MG PO TB24: 1000.0000 mg | ORAL_TABLET | Freq: Every day | ORAL | Status: DC

## 2015-09-25 MED ORDER — GLUCOSE BLOOD VI STRP: ORAL_STRIP | Status: DC

## 2015-09-25 MED ORDER — ONETOUCH ULTRASOFT LANCETS MISC: Status: DC

## 2015-09-25 MED ORDER — ONETOUCH ULTRA 2 W/DEVICE KIT: PACK | Status: DC

## 2015-09-25 NOTE — Progress Notes (Signed)
Pre visit review using our clinic review tool, if applicable. No additional management support is needed unless otherwise documented below in the visit note. 

## 2015-09-25 NOTE — Progress Notes (Signed)
Subjective:    Patient ID: Ryan Rhodes, male    DOB: Jul 20, 1960, 55 y.o.   MRN: [id number]  Chief Complaint  Patient presents with  . Follow-up    A1c check    HPI:  Ryan Rhodes is a 55 y.o. male who  has a past medical history of Gout; Hypertension; and Arthritis. and presents today for a follow up office visit.   1.) Type 2 diabetes - Currently maintained on metformin. Reports taking the medication as prescribed and denies adverse side effects. Has not checked his blood sugars at home. Denies symptoms of end organ damage. Does have mild increased thirst. Endorses that his nutrition has been okay, but not the best.   Lab Results  Component Value Date   HGBA1C 8.7 09/25/2015    No Known Allergies   Current Outpatient Prescriptions on File Prior to Visit  Medication Sig Dispense Refill  . allopurinol (ZYLOPRIM) 100 MG tablet Take 1 tablet (100 mg total) by mouth daily. 30 tablet 6  . colchicine (COLCRYS) 0.6 MG tablet Take 2 tablets at the onset of a gout flair and 1 tablet 1 hour later - max 1.8 12 tablet 0  . enalapril (VASOTEC) 20 MG tablet TAKE 1 TABLET DAILY 90 tablet 2  . indomethacin (INDOCIN) 50 MG capsule Take 1 capsule (50 mg total) by mouth 2 (two) times daily as needed. 40 capsule 0   No current facility-administered medications on file prior to visit.    Review of Systems  Constitutional: Negative for fever and chills.  Endocrine: Negative for polydipsia, polyphagia and polyuria.      Objective:    BP 138/92 mmHg  Pulse 93  Temp(Src) 97.9 F (36.6 C) (Oral)  Resp 16  Ht 5' 5" (1.651 m)  Wt 199 lb 12.8 oz (90.629 kg)  BMI 33.25 kg/m2  SpO2 99% Nursing note and vital signs reviewed.  Physical Exam  Constitutional: He is oriented to person, place, and time. He appears well-developed and well-nourished. No distress.  Cardiovascular: Normal rate, regular rhythm, normal heart sounds and intact distal pulses.   Pulmonary/Chest: Effort normal and  breath sounds normal.  Neurological: He is alert and oriented to person, place, and time.  Skin: Skin is warm and dry.  Psychiatric: He has a normal mood and affect. His behavior is normal. Judgment and thought content normal.       Assessment & Plan:   Problem List Items Addressed This Visit      Endocrine   Type 2 diabetes mellitus ([place]) - Primary    Type 2 diabetes with A1c of 8.7. Increase metformin. Diabetic foot exam is up-to-date. Encouraged to complete diabetic eye exam independently. Maintained on enalapril for CAD risk reduction. No symptoms of end organ damage present. Declines Pneumovax. Encouraged continued lifestyle management including physical activity and nutrition she changes. Follow-up in 3 months.      Relevant Medications   metFORMIN (GLUCOPHAGE-XR) 500 MG 24 hr tablet   Blood Glucose Monitoring Suppl (ONE TOUCH ULTRA 2) w/Device KIT   Lancets (ONETOUCH ULTRASOFT) lancets   glucose blood (ONE TOUCH ULTRA TEST) test strip   Other Relevant Orders   POCT HgB A1C (Completed)       I have discontinued Mr. Dymek linagliptin. I have also changed his metFORMIN. Additionally, I am having him maintain his allopurinol, colchicine, enalapril, indomethacin, ONE TOUCH ULTRA 2, onetouch ultrasoft, and glucose blood.   Meds ordered this encounter  Medications  . metFORMIN (GLUCOPHAGE-XR) 500  MG 24 hr tablet    Sig: Take 2 tablets (1,000 mg total) by mouth daily with breakfast.    Dispense:  180 tablet    Refill:  0    Order Specific Question:  Supervising Provider    Answer:  Pricilla Holm A [[id number]]  . Blood Glucose Monitoring Suppl (ONE TOUCH ULTRA 2) w/Device KIT    Sig: Use the device to check your blood sugars 1-4 times daily as instructed.    Dispense:  1 each    Refill:  0    Substitution permissible per insurance coverage. Dx E11.9    Order Specific Question:  Supervising Provider    Answer:  Pricilla Holm A [[id number]]  . Lancets (ONETOUCH ULTRASOFT)  lancets    Sig: Use as instructed    Dispense:  100 each    Refill:  12    Substitution permissible per insurance coverage. Dx E11.9    Order Specific Question:  Supervising Provider    Answer:  Pricilla Holm A [[id number]]  . glucose blood (ONE TOUCH ULTRA TEST) test strip    Sig: Use as instructed    Dispense:  100 each    Refill:  12    Substitution permissible per insurance coverage. Dx E11.9    Order Specific Question:  Supervising Provider    Answer:  Pricilla Holm A [[id number]]     Follow-up: Return in about 3 months (around 12/26/2015), or if symptoms worsen or fail to improve.  Mauricio Po, FNP

## 2015-09-25 NOTE — Patient Instructions (Signed)
Thank you for choosing [facility].  Summary/Instructions:  Please increase your metformin to 2 tablets daily.  Continue with nutrition and exercise.  Your prescription(s) have been submitted to your pharmacy or been printed and provided for you. Please take as directed and contact our office if you believe you are having problem(s) with the medication(s) or have any questions.  If your symptoms worsen or fail to improve, please contact our office for further instruction, or in case of emergency go directly to the emergency room at the closest medical facility.

## 2015-09-25 NOTE — Assessment & Plan Note (Signed)
Type 2 diabetes with A1c of 8.7. Increase metformin. Diabetic foot exam is up-to-date. Encouraged to complete diabetic eye exam independently. Maintained on enalapril for CAD risk reduction. No symptoms of end organ damage present. Declines Pneumovax. Encouraged continued lifestyle management including physical activity and nutrition she changes. Follow-up in 3 months.

## 2015-10-09 ENCOUNTER — Other Ambulatory Visit: Payer: Self-pay | Admitting: Family

## 2016-01-11 ENCOUNTER — Other Ambulatory Visit: Payer: Self-pay | Admitting: Family

## 2016-01-11 DIAGNOSIS — M109 Gout, unspecified: Secondary | ICD-10-CM

## 2016-03-02 ENCOUNTER — Other Ambulatory Visit (INDEPENDENT_AMBULATORY_CARE_PROVIDER_SITE_OTHER): Payer: 59

## 2016-03-02 ENCOUNTER — Encounter: Payer: Self-pay | Admitting: Family

## 2016-03-02 ENCOUNTER — Ambulatory Visit (INDEPENDENT_AMBULATORY_CARE_PROVIDER_SITE_OTHER): Payer: 59 | Admitting: Family

## 2016-03-02 VITALS — BP 134/98 | HR 95 | Temp 98.2°F | Resp 14 | Ht 65.0 in | Wt 197.0 lb

## 2016-03-02 DIAGNOSIS — Z1211 Encounter for screening for malignant neoplasm of colon: Secondary | ICD-10-CM | POA: Diagnosis not present

## 2016-03-02 DIAGNOSIS — E119 Type 2 diabetes mellitus without complications: Secondary | ICD-10-CM

## 2016-03-02 DIAGNOSIS — Z0000 Encounter for general adult medical examination without abnormal findings: Secondary | ICD-10-CM | POA: Diagnosis not present

## 2016-03-02 DIAGNOSIS — Z23 Encounter for immunization: Secondary | ICD-10-CM | POA: Diagnosis not present

## 2016-03-02 LAB — COMPREHENSIVE METABOLIC PANEL
ALT: 58 U/L — ABNORMAL HIGH (ref 0–53)
AST: 45 U/L — ABNORMAL HIGH (ref 0–37)
Albumin: 4.7 g/dL (ref 3.5–5.2)
Alkaline Phosphatase: 72 U/L (ref 39–117)
BUN: 8 mg/dL (ref 6–23)
CO2: 28 mEq/L (ref 19–32)
Calcium: 10 mg/dL (ref 8.4–10.5)
Chloride: 99 mEq/L (ref 96–112)
Creatinine, Ser: 0.9 mg/dL (ref 0.40–1.50)
GFR: 113.44 mL/min (ref >60.00)
Glucose, Bld: 185 mg/dL — ABNORMAL HIGH (ref 70–99)
Potassium: 4.9 mEq/L (ref 3.5–5.1)
Sodium: 135 mEq/L (ref 135–145)
Total Bilirubin: 0.5 mg/dL (ref 0.2–1.2)
Total Protein: 8.4 g/dL — ABNORMAL HIGH (ref 6.0–8.3)

## 2016-03-02 LAB — LIPID PANEL
Cholesterol: 252 mg/dL — ABNORMAL HIGH (ref 0–200)
HDL: 38.4 mg/dL — ABNORMAL LOW (ref >39.00)
LDL Cholesterol: 187 mg/dL — ABNORMAL HIGH (ref 0–99)
NonHDL: 213.67
Total CHOL/HDL Ratio: 7
Triglycerides: 132 mg/dL (ref 0.0–149.0)
VLDL: 26.4 mg/dL (ref 0.0–40.0)

## 2016-03-02 LAB — PSA: PSA: 0.61 ng/mL (ref 0.10–4.00)

## 2016-03-02 LAB — HEMOGLOBIN A1C: Hgb A1c MFr Bld: 8.3 % — ABNORMAL HIGH (ref 4.6–6.5)

## 2016-03-02 MED ORDER — METFORMIN HCL ER 500 MG PO TB24: 1000.0000 mg | ORAL_TABLET | Freq: Every day | ORAL | 0 refills | Status: DC

## 2016-03-02 NOTE — Progress Notes (Signed)
Subjective:    Patient ID: Ryan Rhodes, male    DOB: 09-16-1960, 55 y.o.   MRN: [id number]  Chief Complaint  Patient presents with  . CPE    fasting    HPI:  Ryan Rhodes is a 55 y.o. male who presents today for an annual wellness visit.   1) Health Maintenance -   Diet - Averages about 2-3 meals per day consisting of a regular diet. Caffeine intake of about 1-2 cups per day  Exercise - Little bit of walking and occasional resistance training.    2) Preventative Exams / Immunizations:  Dental -- Due for exam   Vision -- Due for exam   Health Maintenance  Topic Date Due  . Hepatitis C Screening  04/08/1962  . OPHTHALMOLOGY EXAM  12/21/1972  . COLONOSCOPY  12/21/2012  . INFLUENZA VACCINE  11/04/2015  . PNEUMOCOCCAL POLYSACCHARIDE VACCINE (1) 09/06/2016 (Originally 12/21/1964)  . HEMOGLOBIN A1C  03/26/2016  . FOOT EXAM  08/03/2016  . TETANUS/TDAP  01/03/2021  . HIV Screening  Completed   Immunization History  Administered Date(s) Administered  . Influenza,inj,Quad PF,36+ Mos 03/02/2016     No Known Allergies   Outpatient Medications Prior to Visit  Medication Sig Dispense Refill  . allopurinol (ZYLOPRIM) 100 MG tablet Take 1 tablet (100 mg total) by mouth daily. 30 tablet 6  . Blood Glucose Monitoring Suppl (ONE TOUCH ULTRA 2) w/Device KIT Use the device to check your blood sugars 1-4 times daily as instructed. 1 each 0  . colchicine (COLCRYS) 0.6 MG tablet Take 2 tablets at the onset of a gout flair and 1 tablet 1 hour later - max 1.8 12 tablet 0  . enalapril (VASOTEC) 20 MG tablet TAKE 1 TABLET DAILY 90 tablet 1  . glucose blood (ONE TOUCH ULTRA TEST) test strip Use as instructed 100 each 12  . indomethacin (INDOCIN) 50 MG capsule TAKE 1 CAPSULE TWICE A DAY AS NEEDED 180 capsule 0  . Lancets (ONETOUCH ULTRASOFT) lancets Use as instructed 100 each 12  . metFORMIN (GLUCOPHAGE-XR) 500 MG 24 hr tablet Take 2 tablets (1,000 mg total) by mouth daily with  breakfast. 180 tablet 0   No facility-administered medications prior to visit.      Past Medical History:  Diagnosis Date  . Arthritis   . Gout   . Hypertension      Past Surgical History:  Procedure Laterality Date  . CARPAL TUNNEL RELEASE     bilateral  . FOOT SURGERY     left   . HAND SURGERY     left  . SHOULDER SURGERY     right     Family History  Problem Relation Age of Onset  . Leukemia Mother   . Lung cancer Father   . Multiple myeloma Brother      Social History   Social History  . Marital status: Married    Spouse name: N/A  . Number of children: 4  . Years of education: 24   Occupational History  . Truck Driving    Social History Main Topics  . Smoking status: Never Smoker  . Smokeless tobacco: Never Used  . Alcohol use Yes     Comment: Occasional  . Drug use: No     Comment: Has in the past but not recently  . Sexual activity: Not on file   Other Topics Concern  . Not on file   Social History Narrative   Fun: fishing, Environmental consultant  Denies religious beliefs effecting health care.       Review of Systems  Constitutional: Denies fever, chills, fatigue, or significant weight gain/loss. HENT: Head: Denies headache or neck pain Ears: Denies changes in hearing, ringing in ears, earache, drainage Nose: Denies discharge, stuffiness, itching, nosebleed, sinus pain Throat: Denies sore throat, hoarseness, dry mouth, sores, thrush Eyes: Denies loss/changes in vision, pain, redness, blurry/double vision, flashing lights Cardiovascular: Denies chest pain/discomfort, tightness, palpitations, shortness of breath with activity, difficulty lying down, swelling, sudden awakening with shortness of breath Respiratory: Denies shortness of breath, cough, sputum production, wheezing Gastrointestinal: Denies dysphasia, heartburn, change in appetite, nausea, change in bowel habits, rectal bleeding, constipation, diarrhea, yellow skin or eyes Genitourinary:  Denies frequency, urgency, burning/pain, blood in urine, incontinence, change in urinary strength. Musculoskeletal: Denies muscle/joint pain, stiffness, back pain, redness or swelling of joints, trauma Skin: Denies rashes, lumps, itching, dryness, color changes, or hair/nail changes Neurological: Denies dizziness, fainting, seizures, weakness, numbness, tingling, tremor Psychiatric - Denies nervousness, stress, depression or memory loss Endocrine: Denies heat or cold intolerance, sweating, frequent urination, excessive thirst, changes in appetite Hematologic: Denies ease of bruising or bleeding     Objective:     BP (!) 134/98 (BP Location: Left Arm, Patient Position: Sitting, Cuff Size: Large)   Pulse 95   Temp 98.2 F (36.8 C) (Oral)   Resp 14   Ht _0  (1.651 m)   Wt 197 lb (89.4 kg)   SpO2 96%   BMI 32.78 kg/m  Nursing note and vital signs reviewed.  Physical Exam  Constitutional: He is oriented to person, place, and time. He appears well-developed and well-nourished.  HENT:  Head: Normocephalic.  Right Ear: Hearing, tympanic membrane, external ear and ear canal normal.  Left Ear: Hearing, tympanic membrane, external ear and ear canal normal.  Nose: Nose normal.  Mouth/Throat: Uvula is midline, oropharynx is clear and moist and mucous membranes are normal.  Eyes: Conjunctivae and EOM are normal. Pupils are equal, round, and reactive to light.  Neck: Neck supple. No JVD present. No tracheal deviation present. No thyromegaly present.  Cardiovascular: Normal rate, regular rhythm, normal heart sounds and intact distal pulses.   Pulmonary/Chest: Effort normal and breath sounds normal.  Abdominal: Soft. Bowel sounds are normal. He exhibits no distension and no mass. There is no tenderness. There is no rebound and no guarding.  Musculoskeletal: Normal range of motion. He exhibits no edema or tenderness.  Lymphadenopathy:    He has no cervical adenopathy.  Neurological: He is  alert and oriented to person, place, and time. He has normal reflexes. No cranial nerve deficit. He exhibits normal muscle tone. Coordination normal.  Skin: Skin is warm and dry.  Psychiatric: He has a normal mood and affect. His behavior is normal. Judgment and thought content normal.       Assessment & Plan:   Problem List Items Addressed This Visit      Endocrine   Type 2 diabetes mellitus (HCC)   Relevant Medications   metFORMIN (GLUCOPHAGE-XR) 500 MG 24 hr tablet     Other   Routine general medical examination at a health care facility - Primary    1) Anticipatory Guidance: Discussed importance of wearing a seatbelt while driving and not texting while driving; changing batteries in smoke detector at least once annually; wearing suntan lotion when outside; eating a balanced and moderate diet; getting physical activity at least 30 minutes per day.  2) Immunizations / Screenings / Labs:  Influenza  updated today. Declines Pneumovax. All other immunizations are up to date per recommendations. Obtain Hepatitis C antibody for Hepatitis C screening. Obtain PSA for prostate cancer screening. Due for colon cancer screening with referral for colonoscopy placed. Due for a dental and vision exam encouraged to be completed independently. Allo other screenings are up to date per recommendations. Obtain CBC, CMET, and lipid profile.    Overall well exam with risk factors for cardiovascular disease including hypertension, type 2 diabetes, and obesity. Recommend weight loss of 5-10% of current body weight through nutrition and physical activity. Hypertension slightly elevated today with current regimen. Chronic conditions managed through managed through medication and lifestyle. 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 Continue current healthy lifestyle behaviors and choices. Follow-up prevention exam in 1 year. Follow-up office visit for chronic conditions in 3 months or sooner if needed.        Relevant Orders   CBC   Comprehensive metabolic panel   Lipid panel   PSA   Hemoglobin A1c   Hepatitis C antibody    Other Visit Diagnoses    Colon cancer screening       Relevant Orders   Ambulatory referral to Gastroenterology   Encounter for immunization       Relevant Orders   Flu Vaccine QUAD 36+ mos IM (Completed)       I am having Mr. Gasbarro maintain his allopurinol, colchicine, ONE TOUCH ULTRA 2, onetouch ultrasoft, glucose blood, enalapril, indomethacin, and metFORMIN.   Meds ordered this encounter  Medications  . metFORMIN (GLUCOPHAGE-XR) 500 MG 24 hr tablet    Sig: Take 2 tablets (1,000 mg total) by mouth daily with breakfast.    Dispense:  180 tablet    Refill:  0     Follow-up: Return in about 3 months (around 06/02/2016), or if symptoms worsen or fail to improve.   Mauricio Po, FNP

## 2016-03-02 NOTE — Assessment & Plan Note (Signed)
1) Anticipatory Guidance: Discussed importance of wearing a seatbelt while driving and not texting while driving; changing batteries in smoke detector at least once annually; wearing suntan lotion when outside; eating a balanced and moderate diet; getting physical activity at least 30 minutes per day.  2) Immunizations / Screenings / Labs:  Influenza updated today. Declines Pneumovax. All other immunizations are up to date per recommendations. Obtain Hepatitis C antibody for Hepatitis C screening. Obtain PSA for prostate cancer screening. Due for colon cancer screening with referral for colonoscopy placed. Due for a dental and vision exam encouraged to be completed independently. Allo other screenings are up to date per recommendations. Obtain CBC, CMET, and lipid profile.    Overall well exam with risk factors for cardiovascular disease including hypertension, type 2 diabetes, and obesity. Recommend weight loss of 5-10% of current body weight through nutrition and physical activity. Hypertension slightly elevated today with current regimen. Chronic conditions managed through managed through medication and lifestyle. 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 Continue current healthy lifestyle behaviors and choices. Follow-up prevention exam in 1 year. Follow-up office visit for chronic conditions in 3 months or sooner if needed.

## 2016-03-02 NOTE — Patient Instructions (Addendum)
Thank you for choosing [facility].  SUMMARY AND INSTRUCTIONS:  Medication:  Your prescription(s) have been submitted to your pharmacy or been printed and provided for you. Please take as directed and contact our office if you believe you are having problem(s) with the medication(s) or have any questions.  Labs:  Please stop by the lab on the lower level of the building for your blood work. Your results will be released to [place] (or called to you) after review, usually within 72 hours after test completion. If any changes need to be made, you will be notified at that same time.  1.) The lab is open from 7:30am to 5:30 pm Monday-Friday 2.) No appointment is necessary 3.) Fasting (if needed) is 6-8 hours after food and drink; black coffee and water are okay   Follow up:  If your symptoms worsen or fail to improve, please contact our office for further instruction, or in case of emergency go directly to the emergency room at the closest medical facility.    Health Maintenance, Male A healthy lifestyle and preventative care can promote health and wellness.  Maintain regular health, dental, and eye exams.  Eat a healthy diet. Foods like vegetables, fruits, whole grains, low-fat dairy products, and lean protein foods contain the nutrients you need and are low in calories. Decrease your intake of foods high in solid fats, added sugars, and salt. Get information about a proper diet from your health care provider, if necessary.  Regular physical exercise is one of the most important things you can do for your health. Most adults should get at least 150 minutes of moderate-intensity exercise (any activity that increases your heart rate and causes you to sweat) each week. In addition, most adults need muscle-strengthening exercises on 2 or more days a week.   Maintain a healthy weight. The body mass index (BMI) is a screening tool to identify possible weight problems. It provides an  estimate of body fat based on height and weight. Your health care provider can find your BMI and can help you achieve or maintain a healthy weight. For males 20 years and older:  A BMI below 18.5 is considered underweight.  A BMI of 18.5 to 24.9 is normal.  A BMI of 25 to 29.9 is considered overweight.  A BMI of 30 and above is considered obese.  Maintain normal blood lipids and cholesterol by exercising and minimizing your intake of saturated fat. Eat a balanced diet with plenty of fruits and vegetables. Blood tests for lipids and cholesterol should begin at age 76 and be repeated every 5 years. If your lipid or cholesterol levels are high, you are over age 51, or you are at high risk for heart disease, you may need your cholesterol levels checked more frequently.Ongoing high lipid and cholesterol levels should be treated with medicines if diet and exercise are not working.  If you smoke, find out from your health care provider how to quit. If you do not use tobacco, do not start.  Lung cancer screening is recommended for adults aged 66-80 years who are at high risk for developing lung cancer because of a history of smoking. A yearly low-dose CT scan of the lungs is recommended for people who have at least a 30-pack-year history of smoking and are current smokers or have quit within the past 15 years. A pack year of smoking is smoking an average of 1 pack of cigarettes a day for 1 year (for example, a 30-pack-year history  of smoking could mean smoking 1 pack a day for 30 years or 2 packs a day for 15 years). Yearly screening should continue until the smoker has stopped smoking for at least 15 years. Yearly screening should be stopped for people who develop a health problem that would prevent them from having lung cancer treatment.  If you choose to drink alcohol, do not have more than 2 drinks per day. One drink is considered to be 12 oz (360 mL) of beer, 5 oz (150 mL) of wine, or 1.5 oz (45 mL) of  liquor.  Avoid the use of street drugs. Do not share needles with anyone. Ask for help if you need support or instructions about stopping the use of drugs.  High blood pressure causes heart disease and increases the risk of stroke. High blood pressure is more likely to develop in:  People who have blood pressure in the end of the normal range (100-139/85-89 mm Hg).  People who are overweight or obese.  People who are African American.  If you are 45-36 years of age, have your blood pressure checked every 3-5 years. If you are 27 years of age or older, have your blood pressure checked every year. You should have your blood pressure measured twice-once when you are at a hospital or clinic, and once when you are not at a hospital or clinic. Record the average of the two measurements. To check your blood pressure when you are not at a hospital or clinic, you can use:  An automated blood pressure machine at a pharmacy.  A home blood pressure monitor.  If you are 26-24 years old, ask your health care provider if you should take aspirin to prevent heart disease.  Diabetes screening involves taking a blood sample to check your fasting blood sugar level. This should be done once every 3 years after age 68 if you are at a normal weight and without risk factors for diabetes. Testing should be considered at a younger age or be carried out more frequently if you are overweight and have at least 1 risk factor for diabetes.  Colorectal cancer can be detected and often prevented. Most routine colorectal cancer screening begins at the age of 37 and continues through age 22. However, your health care provider may recommend screening at an earlier age if you have risk factors for colon cancer. On a yearly basis, your health care provider may provide home test kits to check for hidden blood in the stool. A small camera at the end of a tube may be used to directly examine the colon (sigmoidoscopy or colonoscopy) to  detect the earliest forms of colorectal cancer. Talk to your health care provider about this at age 31 when routine screening begins. A direct exam of the colon should be repeated every 5-10 years through age 28, unless early forms of precancerous polyps or small growths are found.  People who are at an increased risk for hepatitis B should be screened for this virus. You are considered at high risk for hepatitis B if:  You were born in a country where hepatitis B occurs often. Talk with your health care provider about which countries are considered high risk.  Your parents were born in a high-risk country and you have not received a shot to protect against hepatitis B (hepatitis B vaccine).  You have HIV or AIDS.  You use needles to inject street drugs.  You live with, or have sex with, someone who has hepatitis  B.  You are a man who has sex with other men (MSM).  You get hemodialysis treatment.  You take certain medicines for conditions like cancer, organ transplantation, and autoimmune conditions.  Hepatitis C blood testing is recommended for all people born from [birth year] through 1963 and any individual with known risk factors for hepatitis C.  Healthy men should no longer receive prostate-specific antigen (PSA) blood tests as part of routine cancer screening. Talk to your health care provider about prostate cancer screening.  Testicular cancer screening is not recommended for adolescents or adult males who have no symptoms. Screening includes self-exam, a health care provider exam, and other screening tests. Consult with your health care provider about any symptoms you have or any concerns you have about testicular cancer.  Practice safe sex. Use condoms and avoid high-risk sexual practices to reduce the spread of sexually transmitted infections (STIs).  You should be screened for STIs, including gonorrhea and chlamydia if:  You are sexually active and are younger than 24 years.  You  are older than 24 years, and your health care provider tells you that you are at risk for this type of infection.  Your sexual activity has changed since you were last screened, and you are at an increased risk for chlamydia or gonorrhea. Ask your health care provider if you are at risk.  If you are at risk of being infected with HIV, it is recommended that you take a prescription medicine daily to prevent HIV infection. This is called pre-exposure prophylaxis (PrEP). You are considered at risk if:  You are a man who has sex with other men (MSM).  You are a heterosexual man who is sexually active with multiple partners.  You take drugs by injection.  You are sexually active with a partner who has HIV.  Talk with your health care provider about whether you are at high risk of being infected with HIV. If you choose to begin PrEP, you should first be tested for HIV. You should then be tested every 3 months for as long as you are taking PrEP.  Use sunscreen. Apply sunscreen liberally and repeatedly throughout the day. You should seek shade when your shadow is shorter than you. Protect yourself by wearing long sleeves, pants, a wide-brimmed hat, and sunglasses year round whenever you are outdoors.  Tell your health care provider of new moles or changes in moles, especially if there is a change in shape or color. Also, tell your health care provider if a mole is larger than the size of a pencil eraser.  A one-time screening for abdominal aortic aneurysm (AAA) and surgical repair of large AAAs by ultrasound is recommended for men aged 65-75 years who are current or former smokers.  Stay current with your vaccines (immunizations). This information is not intended to replace advice given to you by your health care provider. Make sure you discuss any questions you have with your health care provider. Document Released: 09/18/2007 Document Revised: 04/12/2014 Document Reviewed: 12/24/2014 Elsevier  Interactive Patient Education  2017 [employer]   Low Back Strain Rehab Ask your health care provider which exercises are safe for you. Do exercises exactly as told by your health care provider and adjust them as directed. It is normal to feel mild stretching, pulling, tightness, or discomfort as you do these exercises, but you should stop right away if you feel sudden pain or your pain gets worse. Do not begin these exercises until told by your health care provider.  Stretching and range of motion exercises These exercises warm up your muscles and joints and improve the movement and flexibility of your back. These exercises also help to relieve pain, numbness, and tingling. Exercise A: Single knee to chest 1. Lie on your back on a firm surface with both legs straight. 2. Bend one of your knees. Use your hands to move your knee up toward your chest until you feel a gentle stretch in your lower back and buttock.  Hold your leg in this position by holding onto the front of your knee.  Keep your other leg as straight as possible. 3. Hold for __________ seconds. 4. Slowly return to the starting position. 5. Repeat with your other leg. Repeat __________ times. Complete this exercise __________ times a day. Exercise B: Prone extension on elbows 1. Lie on your abdomen on a firm surface. 2. Prop yourself up on your elbows. 3. Use your arms to help lift your chest up until you feel a gentle stretch in your abdomen and your lower back.  This will place some of your body weight on your elbows. If this is uncomfortable, try stacking pillows under your chest.  Your hips should stay down, against the surface that you are lying on. Keep your hip and back muscles relaxed. 4. Hold for __________ seconds. 5. Slowly relax your upper body and return to the starting position. Repeat __________ times. Complete this exercise __________ times a day. Strengthening exercises These exercises build strength and  endurance in your back. Endurance is the ability to use your muscles for a long time, even after they get tired. Exercise C: Pelvic tilt 1. Lie on your back on a firm surface. Bend your knees and keep your feet flat. 2. Tense your abdominal muscles. Tip your pelvis up toward the ceiling and flatten your lower back into the floor.  To help with this exercise, you may place a small towel under your lower back and try to push your back into the towel. 3. Hold for __________ seconds. 4. Let your muscles relax completely before you repeat this exercise. Repeat __________ times. Complete this exercise __________ times a day. Exercise D: Alternating arm and leg raises 1. Get on your hands and knees on a firm surface. If you are on a hard floor, you may want to use padding to cushion your knees, such as an exercise mat. 2. Line up your arms and legs. Your hands should be below your shoulders, and your knees should be below your hips. 3. Lift your left leg behind you. At the same time, raise your right arm and straighten it in front of you.  Do not lift your leg higher than your hip.  Do not lift your arm higher than your shoulder.  Keep your abdominal and back muscles tight.  Keep your hips facing the ground.  Do not arch your back.  Keep your balance carefully, and do not hold your breath. 4. Hold for __________ seconds. 5. Slowly return to the starting position and repeat with your right leg and your left arm. Repeat __________ times. Complete this exercise __________times a day. Exercise J: Single leg lower with bent knees 1. Lie on your back on a firm surface. 2. Tense your abdominal muscles and lift your feet off the floor, one foot at a time, so your knees and hips are bent in an "L" shape (at about 90 degrees).  Your knees should be over your hips and your lower legs should be parallel  to the floor. 3. Keeping your abdominal muscles tense and your knee bent, slowly lower one of your  legs so your toe touches the ground. 4. Lift your leg back up to return to the starting position.  Do not hold your breath.  Do not let your back arch. Keep your back flat against the ground. 5. Repeat with your other leg. Repeat __________ times. Complete this exercise __________ times a day. Posture and body mechanics   Body mechanics refers to the movements and positions of your body while you do your daily activities. Posture is part of body mechanics. Good posture and healthy body mechanics can help to relieve stress in your body's tissues and joints. Good posture means that your spine is in its natural S-curve position (your spine is neutral), your shoulders are pulled back slightly, and your head is not tipped forward. The following are general guidelines for applying improved posture and body mechanics to your everyday activities. Standing   When standing, keep your spine neutral and your feet about hip-width apart. Keep a slight bend in your knees. Your ears, shoulders, and hips should line up.  When you do a task in which you stand in one place for a long time, place one foot up on a stable object that is 2-4 inches (5-10 cm) high, such as a footstool. This helps keep your spine neutral. Sitting  When sitting, keep your spine neutral and keep your feet flat on the floor. Use a footrest, if necessary, and keep your thighs parallel to the floor. Avoid rounding your shoulders, and avoid tilting your head forward.  When working at a desk or a computer, keep your desk at a height where your hands are slightly lower than your elbows. Slide your chair under your desk so you are close enough to maintain good posture.  When working at a computer, place your monitor at a height where you are looking straight ahead and you do not have to tilt your head forward or downward to look at the screen. Resting   When lying down and resting, avoid positions that are most painful for you.  If you  have pain with activities such as sitting, bending, stooping, or squatting (flexion-based activities), lie in a position in which your body does not bend very much. For example, avoid curling up on your side with your arms and knees near your chest (fetal position).  If you have pain with activities such as standing for a long time or reaching with your arms (extension-based activities), lie with your spine in a neutral position and bend your knees slightly. Try the following positions:  Lying on your side with a pillow between your knees.  Lying on your back with a pillow under your knees. Lifting   When lifting objects, keep your feet at least shoulder-width apart and tighten your abdominal muscles.  Bend your knees and hips and keep your spine neutral. It is important to lift using the strength of your legs, not your back. Do not lock your knees straight out.  Always ask for help to lift heavy or awkward objects. This information is not intended to replace advice given to you by your health care provider. Make sure you discuss any questions you have with your health care provider. Document Released: 03/22/2005 Document Revised: 11/27/2015 Document Reviewed: 01/01/2015 Elsevier Interactive Patient Education  2017 [employer].

## 2016-03-03 LAB — CBC
HCT: 47.1 % (ref 39.0–52.0)
Hemoglobin: 15.6 g/dL (ref 13.0–17.0)
MCHC: 33 g/dL (ref 30.0–36.0)
MCV: 79.6 fl (ref 78.0–100.0)
Platelets: 347 10*3/uL (ref 150.0–400.0)
RBC: 5.92 Mil/uL — ABNORMAL HIGH (ref 4.22–5.81)
RDW: 14.3 % (ref 11.5–15.5)
WBC: 5.1 10*3/uL (ref 4.0–10.5)

## 2016-03-03 LAB — HEPATITIS C ANTIBODY: HCV Ab: NEGATIVE

## 2016-03-08 ENCOUNTER — Encounter: Payer: Self-pay | Admitting: Gastroenterology

## 2016-04-06 ENCOUNTER — Other Ambulatory Visit: Payer: Self-pay | Admitting: Family

## 2016-04-10 ENCOUNTER — Other Ambulatory Visit: Payer: Self-pay | Admitting: Family

## 2016-04-10 DIAGNOSIS — E119 Type 2 diabetes mellitus without complications: Secondary | ICD-10-CM

## 2016-05-04 ENCOUNTER — Encounter: Payer: 59 | Admitting: Gastroenterology

## 2016-05-19 ENCOUNTER — Ambulatory Visit (AMBULATORY_SURGERY_CENTER): Payer: Self-pay

## 2016-05-19 VITALS — Ht 65.0 in | Wt 198.0 lb

## 2016-05-19 DIAGNOSIS — Z1211 Encounter for screening for malignant neoplasm of colon: Secondary | ICD-10-CM

## 2016-05-19 MED ORDER — SUPREP BOWEL PREP KIT 17.5-3.13-1.6 GM/177ML PO SOLN: 1.0000 | Freq: Once | ORAL | 0 refills | Status: AC

## 2016-05-19 NOTE — Progress Notes (Signed)
No diet meds No home oxygen No past problem with anesthesia No allergies to eggs or soy  Registered for emmi

## 2016-05-20 ENCOUNTER — Encounter: Payer: Self-pay | Admitting: Gastroenterology

## 2016-06-03 ENCOUNTER — Ambulatory Visit (AMBULATORY_SURGERY_CENTER): Payer: BLUE CROSS/BLUE SHIELD | Admitting: Gastroenterology

## 2016-06-03 ENCOUNTER — Encounter: Payer: Self-pay | Admitting: Gastroenterology

## 2016-06-03 VITALS — BP 116/82 | HR 81 | Temp 97.8°F | Resp 11 | Ht 65.0 in | Wt 198.0 lb

## 2016-06-03 DIAGNOSIS — Z1211 Encounter for screening for malignant neoplasm of colon: Secondary | ICD-10-CM | POA: Diagnosis present

## 2016-06-03 DIAGNOSIS — Z1212 Encounter for screening for malignant neoplasm of rectum: Secondary | ICD-10-CM

## 2016-06-03 MED ORDER — SODIUM CHLORIDE 0.9 % IV SOLN: 500.0000 mL | INTRAVENOUS | Status: DC

## 2016-06-03 NOTE — Progress Notes (Signed)
  [facility] Anesthesia Post-op Note  Patient: Lethea KillingsReginald J Hodes  Procedure(s) Performed: colonoscopy  Patient Location: [facility] - Recovery Area  Anesthesia Type: Deep Sedation/Propofol  Level of Consciousness: awake, oriented and patient cooperative  Airway and Oxygen Therapy: Patient Spontanous Breathing  Post-op Pain: none  Post-op Assessment:  Post-op Vital signs reviewed, Patient's Cardiovascular Status Stable, Respiratory Function Stable, Patent Airway, No signs of Nausea or vomiting and Pain level controlled  Post-op Vital Signs: Reviewed and stable  Complications: No apparent anesthesia complications  Shervon Kerwin E 8:57 AM

## 2016-06-03 NOTE — Progress Notes (Signed)
Called to room to assist during endoscopic procedure.  Patient ID and intended procedure confirmed with present staff. Received instructions for my participation in the procedure from the performing physician.  

## 2016-06-03 NOTE — Op Note (Signed)
[facility] Patient Name: Ryan Rhodes Procedure Date: 06/03/2016 8:28 AM MRN: [id number] Endoscopist: Napoleon Form , MD Age: 56 Referring MD:  Date of Birth: [birth year] Gender: Male Account #: [id number] Procedure:                Colonoscopy Indications:              Screening for colorectal malignant neoplasm, This                            is the patient's first colonoscopy Medicines:                Monitored Anesthesia Care Procedure:                Pre-Anesthesia Assessment:                           - Prior to the procedure, a History and Physical                            was performed, and patient medications and                            allergies were reviewed. The patient's tolerance of                            previous anesthesia was also reviewed. The risks                            and benefits of the procedure and the sedation                            options and risks were discussed with the patient.                            All questions were answered, and informed consent                            was obtained. Prior Anticoagulants: The patient has                            taken no previous anticoagulant or antiplatelet                            agents. ASA Grade Assessment: II - A patient with                            mild systemic disease. After reviewing the risks                            and benefits, the patient was deemed in                            satisfactory condition to undergo the procedure.  After obtaining informed consent, the colonoscope                            was passed under direct vision. Throughout the                            procedure, the patient's blood pressure, pulse, and                            oxygen saturations were monitored continuously. The                            Model CF-HQ190L [id number](SN#[id number]) scope was introduced                            through the anus and  advanced to the the terminal                            ileum, with identification of the appendiceal                            orifice and IC valve. The colonoscopy was performed                            without difficulty. The patient tolerated the                            procedure well. The quality of the bowel                            preparation was good. The terminal ileum, ileocecal                            valve, appendiceal orifice, and rectum were                            photographed. Scope In: 8:33:46 AM Scope Out: 8:51:56 AM Scope Withdrawal Time: 0 hours 13 minutes 7 seconds  Total Procedure Duration: 0 hours 18 minutes 10 seconds  Findings:                 The perianal and digital rectal examinations were                            normal.                           Multiple small and large-mouthed diverticula were                            found in the sigmoid colon, descending colon and                            ascending colon.  Non-bleeding internal hemorrhoids were found during                            retroflexion. The hemorrhoids were small.                           The exam was otherwise without abnormality. Complications:            No immediate complications. Estimated Blood Loss:     Estimated blood loss: none. Impression:               - Diverticulosis in the sigmoid colon, in the                            descending colon and in the ascending colon.                           - Non-bleeding internal hemorrhoids.                           - The examination was otherwise normal.                           - No specimens collected. Recommendation:           - Patient has a contact number available for                            emergencies. The signs and symptoms of potential                            delayed complications were discussed with the                            patient. Return to normal activities tomorrow.                             Written discharge instructions were provided to the                            patient.                           - Resume previous diet.                           - Continue present medications.                           - Repeat colonoscopy in 10 years for screening                            purposes. Napoleon Form, MD 06/03/2016 8:54:47 AM This report has been signed electronically.

## 2016-06-03 NOTE — Patient Instructions (Signed)
HANDOUTS GIVEN: diverticulosis AND HEMORRHOIDS.   YOU HAD AN ENDOSCOPIC PROCEDURE TODAY AT THE [facility]:   Refer to the procedure report that was given to you for any specific questions about what was found during the examination.  If the procedure report does not answer your questions, please call your gastroenterologist to clarify.  If you requested that your care partner not be given the details of your procedure findings, then the procedure report has been included in a sealed envelope for you to review at your convenience later.  YOU SHOULD EXPECT: Some feelings of bloating in the abdomen. Passage of more gas than usual.  Walking can help get rid of the air that was put into your GI tract during the procedure and reduce the bloating. If you had a lower endoscopy (such as a colonoscopy or flexible sigmoidoscopy) you may notice spotting of blood in your stool or on the toilet paper. If you underwent a bowel prep for your procedure, you may not have a normal bowel movement for a few days.  Please Note:  You might notice some irritation and congestion in your nose or some drainage.  This is from the oxygen used during your procedure.  There is no need for concern and it should clear up in a day or so.  SYMPTOMS TO REPORT IMMEDIATELY:   Following lower endoscopy (colonoscopy or flexible sigmoidoscopy):  Excessive amounts of blood in the stool  Significant tenderness or worsening of abdominal pains  Swelling of the abdomen that is new, acute  Fever of 100F or higher  For urgent or emergent issues, a gastroenterologist can be reached at any hour by calling [phone].   DIET:  We do recommend a small meal at first, but then you may proceed to your regular diet.  Drink plenty of fluids but you should avoid alcoholic beverages for 24 hours.  ACTIVITY:  You should plan to take it easy for the rest of today and you should NOT DRIVE or use heavy machinery until tomorrow (because  of the sedation medicines used during the test).    FOLLOW UP: Our staff will call the number listed on your records the next business day following your procedure to check on you and address any questions or concerns that you may have regarding the information given to you following your procedure. If we do not reach you, we will leave a message.  However, if you are feeling well and you are not experiencing any problems, there is no need to return our call.  We will assume that you have returned to your regular daily activities without incident.  If any biopsies were taken you will be contacted by phone or by letter within the next 1-3 weeks.  Please call us at [phone] if you have not heard about the biopsies in 3 weeks.    SIGNATURES/CONFIDENTIALITY: You and/or your care partner have signed paperwork which will be entered into your electronic medical record.  These signatures attest to the fact that that the information above on your After Visit Summary has been reviewed and is understood.  Full responsibility of the confidentiality of this discharge information lies with you and/or your care-partner.

## 2016-06-04 ENCOUNTER — Telehealth: Payer: Self-pay | Admitting: *Deleted

## 2016-06-04 NOTE — Telephone Encounter (Signed)
  Follow up Call-  Call back number 06/03/2016  Post procedure Call Back phone  # [phone]  Permission to leave phone message Yes  Some recent data might be hidden     Patient questions:  Do you have a fever, pain , or abdominal swelling? No. Pain Score  0 *  Have you tolerated food without any problems? Yes.    Have you been able to return to your normal activities? Yes.    Do you have any questions about your discharge instructions: Diet   No. Medications  No. Follow up visit  No.  Do you have questions or concerns about your Care? No.  Actions: * If pain score is 4 or above: No action needed, pain <4.

## 2016-06-04 NOTE — Telephone Encounter (Signed)
Left message on f/u call 

## 2016-06-15 ENCOUNTER — Ambulatory Visit (INDEPENDENT_AMBULATORY_CARE_PROVIDER_SITE_OTHER): Payer: BLUE CROSS/BLUE SHIELD | Admitting: Family

## 2016-06-15 ENCOUNTER — Encounter: Payer: Self-pay | Admitting: Family

## 2016-06-15 VITALS — BP 144/104 | HR 101 | Temp 98.3°F | Resp 16 | Ht 65.0 in | Wt 195.8 lb

## 2016-06-15 DIAGNOSIS — M65322 Trigger finger, left index finger: Secondary | ICD-10-CM

## 2016-06-15 NOTE — Progress Notes (Signed)
Subjective:    Patient ID: Ryan Rhodes, male    DOB: 25-Jun-1960, 56 y.o.   MRN: [id number]  Chief Complaint  Patient presents with  . Hand Pain    x1 month, left index finger has been locking in place and there has been some joint pain    HPI:  Ryan Rhodes is a 56 y.o. male who  has a past medical history of Arthritis; Diabetes mellitus ([place]); Gout; and Hypertension. and presents today for an acute office visit.  This is a new problem. Associated symptom of pain located in his left index finger described as locking has been going on for about about 1 month. Denies any trauma or injury. Describes the pain as achy that is aggravated with movement and trying to grab things. Timing of the symptoms are worse in the morning.  Modifying factors include taking a gout medication which did not help very much. No history or previous trauma noted. No numbness or tingling.  No Known Allergies    Outpatient Medications Prior to Visit  Medication Sig Dispense Refill  . allopurinol (ZYLOPRIM) 100 MG tablet Take 1 tablet (100 mg total) by mouth daily. 30 tablet 6  . Blood Glucose Monitoring Suppl (ONE TOUCH ULTRA 2) w/Device KIT Use the device to check your blood sugars 1-4 times daily as instructed. 1 each 0  . colchicine (COLCRYS) 0.6 MG tablet Take 2 tablets at the onset of a gout flair and 1 tablet 1 hour later - max 1.8 12 tablet 0  . enalapril (VASOTEC) 20 MG tablet TAKE 1 TABLET DAILY 90 tablet 1  . glucose blood (ONE TOUCH ULTRA TEST) test strip Use as instructed 100 each 12  . ibuprofen (ADVIL,MOTRIN) 400 MG tablet Take 400 mg by mouth every 6 (six) hours as needed.    . indomethacin (INDOCIN) 50 MG capsule TAKE 1 CAPSULE TWICE A DAY AS NEEDED 180 capsule 0  . Lancets (ONETOUCH ULTRASOFT) lancets Use as instructed 100 each 12  . metFORMIN (GLUCOPHAGE-XR) 500 MG 24 hr tablet TAKE 1 TABLET DAILY WITH BREAKFAST 90 tablet 0  . Multiple Vitamin (MULTIVITAMIN) tablet Take 1 tablet by mouth  daily.     Facility-Administered Medications Prior to Visit  Medication Dose Route Frequency Provider Last Rate Last Dose  . 0.9 %  sodium chloride infusion  500 mL Intravenous Continuous Harl Bowie V, MD          Review of Systems  Constitutional: Negative for chills and fever.  Musculoskeletal:       Positive for left index finger pain / catching.   Neurological: Negative for weakness and numbness.      Objective:    BP (!) 144/104 (BP Location: Left Arm, Patient Position: Sitting, Cuff Size: Large)   Pulse (!) 101   Temp 98.3 F (36.8 C) (Oral)   Resp 16   Ht '5\' 5"'$  (1.651 m)   Wt 195 lb 12.8 oz (88.8 kg)   SpO2 94%   BMI 32.58 kg/m  Nursing note and vital signs reviewed.  Physical Exam  Constitutional: He is oriented to person, place, and time. He appears well-developed and well-nourished. No distress.  Cardiovascular: Normal rate, regular rhythm, normal heart sounds and intact distal pulses.   Pulmonary/Chest: Effort normal and breath sounds normal.  Musculoskeletal:  Left index finger - no obvious deformity, discoloration, or edema. There is palpable tenderness and a small nodule located proximal to the metacarpal phalangeal joint on the flexor tendon. There is  obvious trigger/locking with active range of motion. Capillary refill intact and appropriate.  Neurological: He is alert and oriented to person, place, and time.  Skin: Skin is warm and dry.  Psychiatric: He has a normal mood and affect. His behavior is normal. Judgment and thought content normal.   Procedure: Cortisone injection of trigger finger.  Informed consent obtained following discussion of risks and alternative treatments. Site was identified and marked. Site was cleansed with betadine and allowed to dry. 0.5 ml Bupivicane and 0.5 ml (40 mg/ml) Kenalog was injected into the nodule without complication and tolerated well. Post care instructions were provided.     Assessment & Plan:   Problem  List Items Addressed This Visit      Musculoskeletal and Integument   Trigger index finger of left hand - Primary    Symptoms and exam consistent with trigger finger. Corticosteroid injection provided without complication. Continue to monitor and follow up if symptoms worsen or does not improve.           I am having Mr. Schreifels maintain his allopurinol, colchicine, ONE TOUCH ULTRA 2, onetouch ultrasoft, glucose blood, indomethacin, enalapril, metFORMIN, multivitamin, and ibuprofen. We will continue to administer sodium chloride.   Follow-up: Return if symptoms worsen or fail to improve.  Mauricio Po, FNP

## 2016-06-15 NOTE — Patient Instructions (Signed)
Thank you for choosing [facility].  SUMMARY AND INSTRUCTIONS:  Keep basic wound care.  Follow up:  If your symptoms worsen or fail to improve, please contact our office for further instruction, or in case of emergency go directly to the emergency room at the closest medical facility.     Trigger Finger Trigger finger (stenosing tenosynovitis) is a condition that causes a finger to get stuck in a bent position. Each finger has a tough, cord-like tissue that connects muscle to bone (tendon), and each tendon is surrounded by a tunnel of tissue (tendon sheath). To move your finger, your tendon needs to slide freely through the sheath. Trigger finger happens when the tendon or the sheath thickens, making it difficult to move your finger. Trigger finger can affect any finger or a thumb. It may affect more than one finger. Mild cases may clear up with rest and medicine. Severe cases require more treatment. What are the causes? Trigger finger is caused by a thickened finger tendon or tendon sheath. The cause of this thickening is not known. What increases the risk? The following factors may make you more likely to develop this condition:  Doing activities that require a strong grip.  Having rheumatoid arthritis, gout, or diabetes.  Being 56 years old.  Being a woman. What are the signs or symptoms? Symptoms of this condition include:  Pain when bending or straightening your finger.  Tenderness or swelling where your finger attaches to the palm of your hand.  A lump in the palm of your hand or on the inside of your finger.  Hearing a popping sound when you try to straighten your finger.  Feeling a popping, catching, or locking sensation when you try to straighten your finger.  Being unable to straighten your finger. How is this diagnosed? This condition is diagnosed based on your symptoms and a physical exam. How is this treated? This condition may be treated  by:  Resting your finger and avoiding activities that make symptoms worse.  Wearing a finger splint to keep your finger in a slightly bent position.  Taking NSAIDs to relieve pain and swelling.  Injecting medicine (steroids) into the tendon sheath to reduce swelling and irritation. Injections may need to be repeated.  Having surgery to open the tendon sheath. This may be done if other treatments do not work and you cannot straighten your finger. You may need physical therapy after surgery. Follow these instructions at home:  Use moist heat to help reduce pain and swelling as told by your health care provider.  Rest your finger and avoid activities that make pain worse. Return to normal activities as told by your health care provider.  If you have a splint, wear it as told by your health care provider.  Take over-the-counter and prescription medicines only as told by your health care provider.  Keep all follow-up visits as told by your health care provider. This is important. Contact a health care provider if:  Your symptoms are not improving with home care. Summary  Trigger finger (stenosing tenosynovitis) causes your finger to get stuck in a bent position, and it can make it difficult and painful to straighten your finger.  This condition develops when a finger tendon or tendon sheath thickens.  Treatment starts with resting, wearing a splint, and taking NSAIDs.  In severe cases, surgery to open the tendon sheath may be needed. This information is not intended to replace advice given to you by your health care provider. Make  sure you discuss any questions you have with your health care provider. Document Released: 01/10/2004 Document Revised: 03/02/2016 Document Reviewed: 03/02/2016 Elsevier Interactive Patient Education  2017 [employer].

## 2016-06-15 NOTE — Assessment & Plan Note (Signed)
Symptoms and exam consistent with trigger finger. Corticosteroid injection provided without complication. Continue to monitor and follow up if symptoms worsen or does not improve.

## 2016-08-09 ENCOUNTER — Telehealth: Payer: Self-pay | Admitting: *Deleted

## 2016-08-09 DIAGNOSIS — E119 Type 2 diabetes mellitus without complications: Secondary | ICD-10-CM

## 2016-08-09 MED ORDER — METFORMIN HCL ER 500 MG PO TB24: 500.0000 mg | ORAL_TABLET | Freq: Every day | ORAL | 2 refills | Status: DC

## 2016-08-09 NOTE — Telephone Encounter (Signed)
Left msg on triage stating no longer uses [facility] mail service. Needing rx for metformin called into [facility] on [address]. Verified chart sent rx electronically...Raechel Chute/lmb

## 2016-08-20 ENCOUNTER — Encounter: Payer: Self-pay | Admitting: Family

## 2016-08-20 ENCOUNTER — Ambulatory Visit (INDEPENDENT_AMBULATORY_CARE_PROVIDER_SITE_OTHER): Payer: BLUE CROSS/BLUE SHIELD | Admitting: Family

## 2016-08-20 VITALS — BP 116/80 | HR 108 | Temp 98.4°F | Resp 16 | Ht 65.0 in | Wt 189.0 lb

## 2016-08-20 DIAGNOSIS — E119 Type 2 diabetes mellitus without complications: Secondary | ICD-10-CM | POA: Diagnosis not present

## 2016-08-20 DIAGNOSIS — N481 Balanitis: Secondary | ICD-10-CM | POA: Diagnosis not present

## 2016-08-20 LAB — POCT URINALYSIS DIPSTICK
Bilirubin, UA: NEGATIVE
Blood, UA: NEGATIVE
Ketones, UA: NEGATIVE
Leukocytes, UA: NEGATIVE
Nitrite, UA: NEGATIVE
Protein, UA: NEGATIVE
Spec Grav, UA: 1.015 (ref 1.010–1.025)
Urobilinogen, UA: NEGATIVE E.U./dL — AB
pH, UA: 6 (ref 5.0–8.0)

## 2016-08-20 LAB — POCT GLYCOSYLATED HEMOGLOBIN (HGB A1C): Hemoglobin A1C: 12.1

## 2016-08-20 MED ORDER — METFORMIN HCL ER 500 MG PO TB24: 1000.0000 mg | ORAL_TABLET | Freq: Every day | ORAL | 2 refills | Status: DC

## 2016-08-20 MED ORDER — DULAGLUTIDE 0.75 MG/0.5ML ~~LOC~~ SOAJ: 0.7500 mg | SUBCUTANEOUS | 0 refills | Status: DC

## 2016-08-20 MED ORDER — FLUCONAZOLE 150 MG PO TABS: 150.0000 mg | ORAL_TABLET | Freq: Every day | ORAL | 0 refills | Status: DC

## 2016-08-20 NOTE — Patient Instructions (Addendum)
Thank you for choosing [facility].  SUMMARY AND INSTRUCTIONS:  Start the fluconazole until completed may also apply miconzaole  1% cream over the counter.    Medication:  Your prescription(s) have been submitted to your pharmacy or been printed and provided for you. Please take as directed and contact our office if you believe you are having problem(s) with the medication(s) or have any questions.  Follow up:  If your symptoms worsen or fail to improve, please contact our office for further instruction, or in case of emergency go directly to the emergency room at the closest medical facility.    Balanitis Balanitis is swelling and irritation (inflammation) of the head of the penis (glans penis). The condition may also cause inflammation of the skin around the glans penis (foreskin) in men who have not been circumcised. It may develop because of an infection or another medical condition. Balanitis occurs most often among men who have not had their foreskin removed (uncircumcised men). Balanitis sometimes causes scarring of the penis or foreskin, which can require surgery. Untreated balanitis can increase the risk of penile cancer. What are the causes? Common causes of this condition include:  Poor personal hygiene, especially in uncircumcised men. Not cleaning the glans penis and foreskin well can result in buildup of bacteria, viruses, and yeast, which can lead to infection and inflammation.  Irritation and lack of air flow due to fluid (smegma) that can build up on the glans penis. Other causes include:  Chemical irritation from products such as soaps or shower gels (especially those that have fragrance), condoms, personal lubricants, petroleum jelly, spermicides, or fabric softeners.  Skin conditions, such as eczema, dermatitis, and psoriasis.  Allergies to medicines, such as tetracycline and sulfa drugs.  Certain medical conditions, including liver cirrhosis, congestive  heart failure, diabetes, and kidney disease.  Infections, such as candidiasis, HPV (human papillomavirus), herpes simplex, gonorrhea, and syphilis.  Severe obesity. What increases the risk? The following factors may make you more likely to develop this condition:  Having diabetes. This is the most common risk factor.  Having a tight foreskin that is difficult to pull back (retract) past the glans.  Having sexual intercourse without using a condom. What are the signs or symptoms? Symptoms of this condition include:  Discharge from under the foreskin.  A bad smell.  Pain or difficulty retracting the foreskin.  Tenderness, redness, and swelling of the glans.  A rash or sores on the glans or foreskin.  Itchiness.  Inability to get an erection due to pain.  Difficulty urinating.  Scarring of the penis or foreskin, in some cases. How is this diagnosed? This condition may be diagnosed based on:  A physical exam.  Testing a swab of discharge to check for bacterial or fungal infection.  Blood tests:  To check for viruses that can cause balanitis.  To check your blood sugar (glucose) level. High blood glucose could be a sign of diabetes, which can cause balanitis. How is this treated? Treatment for balanitis depends on the cause. Treatment may include:  Improving personal hygiene. Your health care provider may recommend sitting in a bath of warm water that is deep enough to cover your hips and buttocks (sitz bath).  Medicines such as:  Creams or ointments to reduce swelling (steroids) or to treat an infection.  Antibiotic medicine.  Antifungal medicine.  Surgery to remove or cut the foreskin (circumcision). This may be done if you have scarring on the foreskin that makes it difficult to  retract.  Controlling other medical problems that may be causing your condition or making it worse. Follow these instructions at home:  Do not have sex until the condition clears  up, or until your health care provider approves.  Keep your penis clean and dry. Take sitz baths as recommended by your health care provider.  Avoid products that irritate your skin or make symptoms worse, such as soaps and shower gels that have fragrance.  Take over-the-counter and prescription medicines only as told by your health care provider.  If you were prescribed an antibiotic medicine or a cream or ointment, use it as told by your health care provider. Do not stop using your medicine, cream, or ointment even if you start to feel better.  Do not drive or use heavy machinery while taking prescription pain medicine. Contact a health care provider if:  Your symptoms get worse or do not improve with home care.  You develop chills or a fever.  You have trouble urinating.  You cannot retract your foreskin. Get help right away if:  You develop severe pain.  You are unable to urinate. Summary  Balanitis is inflammation of the head of the penis (glans penis) caused by irritation or infection.  Balanitis causes pain, redness, and swelling of the glans penis.  This condition is most common among uncircumcised men who do not keep their glans penis clean and in men who have diabetes.  Treatment may include creams or ointments.  Good hygiene is important for prevention. This includes pulling back the foreskin when washing your penis. This information is not intended to replace advice given to you by your health care provider. Make sure you discuss any questions you have with your health care provider. Document Released: 08/08/2008 Document Revised: 02/09/2016 Document Reviewed: 02/09/2016 Elsevier Interactive Patient Education  2017 [employer].

## 2016-08-20 NOTE — Assessment & Plan Note (Addendum)
Significant glycosuria noted on urinalysis. In office A1c of 12.1 indicating a worsening and poorly controlled type 2 diabetes with current medication regimen. Increase metformin to 1000 mg daily. Start Trulicity. This is a significant contributing factor to diagnosis of balanitis. Emphasize importance of monitoring blood sugar at home 2-3 times per day. Follow-up in one month or sooner with blood sugar numbers.

## 2016-08-20 NOTE — Assessment & Plan Note (Signed)
New onset penile rash consistent with balanitis secondary to increased levels of blood sugar. In office urinalysis negative for leukocytes, nitrites, or hematuria. Positive for 3+ glycosuria. Start oral fluconazole and over-the-counter miconazole cream. Discussed preventative care. Encouraged to monitor blood sugars to reduce sugar content of urine.
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
## 2016-09-07 ENCOUNTER — Telehealth: Payer: Self-pay | Admitting: Family

## 2016-09-07 NOTE — Telephone Encounter (Signed)
Pt called stating he did find out his insurance will cover Dulaglutide (TRULICITY) 0.75 MG/0.5ML SOPN Please send to [facility] on [address]

## 2016-09-08 MED ORDER — DULAGLUTIDE 0.75 MG/0.5ML ~~LOC~~ SOAJ: 0.7500 mg | SUBCUTANEOUS | 1 refills | Status: DC

## 2016-09-08 NOTE — Telephone Encounter (Signed)
Rx sent 

## 2016-10-03 ENCOUNTER — Other Ambulatory Visit: Payer: Self-pay | Admitting: Family

## 2016-11-11 ENCOUNTER — Telehealth: Payer: Self-pay | Admitting: Family

## 2016-11-11 MED ORDER — ALLOPURINOL 100 MG PO TABS: 100.0000 mg | ORAL_TABLET | Freq: Every day | ORAL | 0 refills | Status: DC

## 2016-11-11 NOTE — Telephone Encounter (Signed)
Pt wife called in and said that pt needs refill on his allopurinol (ZYLOPRIM) 100 MG tablet [[id number]]    [facility] on file

## 2016-11-11 NOTE — Telephone Encounter (Signed)
Sent 30 say script to [facility] pt is due for f/u appt...Ryan Rhodes/lmb

## 2016-11-15 ENCOUNTER — Telehealth: Payer: Self-pay | Admitting: Family

## 2016-11-15 MED ORDER — ENALAPRIL MALEATE 20 MG PO TABS: 20.0000 mg | ORAL_TABLET | Freq: Every day | ORAL | 0 refills | Status: DC

## 2016-11-15 NOTE — Telephone Encounter (Signed)
Sent 30 day script until f/u appt...Raechel Chute/lmb

## 2016-11-15 NOTE — Telephone Encounter (Signed)
enalapril (VASOTEC) 20 MG tablet   Patient wife called and stated the wrong rx was called in. [facility]

## 2017-01-05 ENCOUNTER — Other Ambulatory Visit (INDEPENDENT_AMBULATORY_CARE_PROVIDER_SITE_OTHER): Payer: 59

## 2017-01-05 ENCOUNTER — Ambulatory Visit: Payer: BLUE CROSS/BLUE SHIELD | Admitting: Nurse Practitioner

## 2017-01-05 ENCOUNTER — Ambulatory Visit (INDEPENDENT_AMBULATORY_CARE_PROVIDER_SITE_OTHER): Payer: 59 | Admitting: Nurse Practitioner

## 2017-01-05 ENCOUNTER — Encounter: Payer: Self-pay | Admitting: Nurse Practitioner

## 2017-01-05 VITALS — BP 124/80 | HR 90 | Temp 98.1°F | Ht 65.0 in | Wt 200.0 lb

## 2017-01-05 DIAGNOSIS — M545 Low back pain: Secondary | ICD-10-CM

## 2017-01-05 DIAGNOSIS — I10 Essential (primary) hypertension: Secondary | ICD-10-CM

## 2017-01-05 DIAGNOSIS — E782 Mixed hyperlipidemia: Secondary | ICD-10-CM

## 2017-01-05 DIAGNOSIS — Z23 Encounter for immunization: Secondary | ICD-10-CM

## 2017-01-05 DIAGNOSIS — E119 Type 2 diabetes mellitus without complications: Secondary | ICD-10-CM

## 2017-01-05 DIAGNOSIS — G8929 Other chronic pain: Secondary | ICD-10-CM

## 2017-01-05 DIAGNOSIS — M109 Gout, unspecified: Secondary | ICD-10-CM

## 2017-01-05 LAB — BASIC METABOLIC PANEL
BUN: 12 mg/dL (ref 6–23)
CO2: 26 mEq/L (ref 19–32)
Calcium: 10 mg/dL (ref 8.4–10.5)
Chloride: 100 mEq/L (ref 96–112)
Creatinine, Ser: 0.97 mg/dL (ref 0.40–1.50)
GFR: 103.71 mL/min (ref >60.00)
Glucose, Bld: 173 mg/dL — ABNORMAL HIGH (ref 70–99)
Potassium: 4 mEq/L (ref 3.5–5.1)
Sodium: 134 mEq/L — ABNORMAL LOW (ref 135–145)

## 2017-01-05 LAB — HEPATIC FUNCTION PANEL
ALT: 29 U/L (ref 0–53)
AST: 21 U/L (ref 0–37)
Albumin: 4.7 g/dL (ref 3.5–5.2)
Alkaline Phosphatase: 56 U/L (ref 39–117)
Bilirubin, Direct: 0.1 mg/dL (ref 0.0–0.3)
Total Bilirubin: 0.5 mg/dL (ref 0.2–1.2)
Total Protein: 8.6 g/dL — ABNORMAL HIGH (ref 6.0–8.3)

## 2017-01-05 LAB — URIC ACID: Uric Acid, Serum: 6 mg/dL (ref 4.0–7.8)

## 2017-01-05 LAB — LIPID PANEL
Cholesterol: 251 mg/dL — ABNORMAL HIGH (ref 0–200)
HDL: 40.9 mg/dL (ref >39.00)
LDL Cholesterol: 177 mg/dL — ABNORMAL HIGH (ref 0–99)
NonHDL: 209.97
Total CHOL/HDL Ratio: 6
Triglycerides: 166 mg/dL — ABNORMAL HIGH (ref 0.0–149.0)
VLDL: 33.2 mg/dL (ref 0.0–40.0)

## 2017-01-05 NOTE — Progress Notes (Signed)
Subjective:  Patient ID: Ryan Rhodes, male    DOB: 06-26-1960  Age: 56 y.o. MRN: [id number]  CC: Medication Refill (BP medication refills. flu shot?)   Back Pain  This is a chronic problem. The current episode started more than 1 month ago. The problem occurs intermittently. The problem has been waxing and waning since onset. The pain is present in the gluteal and lumbar spine. The quality of the pain is described as aching and cramping. Radiates to: right buttocks. The symptoms are aggravated by sitting. Pertinent negatives include no bladder incontinence, bowel incontinence, dysuria, fever, leg pain, numbness, paresis, paresthesias, pelvic pain, perianal numbness, tingling, weakness or weight loss. Risk factors include sedentary lifestyle and poor posture. He has tried heat for the symptoms. The treatment provided moderate relief.   HTN: Home BP reading: 130s/80s-140s/90s. Controlled with enalapril BP Readings from Last 3 Encounters:  01/05/17 124/80  08/20/16 116/80  06/15/16 (!) 144/104    DM: Does not check glucose at home consistently. Denies any hypoglycemic symptoms. Denies any adverse effects from trulicity and metformin.  Gout: No flare in last 1year. Use of allopurinol daily. Has not used indomethacin nor colchicine.  Outpatient Medications Prior to Visit  Medication Sig Dispense Refill  . Blood Glucose Monitoring Suppl (ONE TOUCH ULTRA 2) w/Device KIT Use the device to check your blood sugars 1-4 times daily as instructed. 1 each 0  . fluconazole (DIFLUCAN) 150 MG tablet Take 1 tablet (150 mg total) by mouth daily. 3 tablet 0  . glucose blood (ONE TOUCH ULTRA TEST) test strip Use as instructed 100 each 12  . ibuprofen (ADVIL,MOTRIN) 400 MG tablet Take 400 mg by mouth every 6 (six) hours as needed.    . indomethacin (INDOCIN) 50 MG capsule TAKE 1 CAPSULE TWICE A DAY AS NEEDED 180 capsule 0  . Lancets (ONETOUCH ULTRASOFT) lancets Use as instructed 100 each 12  .  Multiple Vitamin (MULTIVITAMIN) tablet Take 1 tablet by mouth daily.    Marland Kitchen allopurinol (ZYLOPRIM) 100 MG tablet Take 1 tablet (100 mg total) by mouth daily. Follow-up appt is due must see MD for refills 30 tablet 0  . colchicine (COLCRYS) 0.6 MG tablet Take 2 tablets at the onset of a gout flair and 1 tablet 1 hour later - max 1.8 12 tablet 0  . Dulaglutide (TRULICITY) 3.78 HY/8.5OY SOPN Inject 0.75 mg into the skin once a week. 4 pen 1  . enalapril (VASOTEC) 20 MG tablet Take 1 tablet (20 mg total) by mouth daily. F/u appt is due must see MD for refills 30 tablet 0  . metFORMIN (GLUCOPHAGE-XR) 500 MG 24 hr tablet Take 2 tablets (1,000 mg total) by mouth daily with breakfast. 60 tablet 2   Facility-Administered Medications Prior to Visit  Medication Dose Route Frequency Provider Last Rate Last Dose  . 0.9 %  sodium chloride infusion  500 mL Intravenous Continuous Nandigam, Kavitha V, MD        ROS See HPI  Objective:  BP 124/80   Pulse 90   Temp 98.1 F (36.7 C)   Ht _0  (1.651 m)   Wt 200 lb (90.7 kg)   SpO2 98%   BMI 33.28 kg/m   BP Readings from Last 3 Encounters:  01/05/17 124/80  08/20/16 116/80  06/15/16 (!) 144/104    Wt Readings from Last 3 Encounters:  01/05/17 200 lb (90.7 kg)  08/20/16 189 lb (85.7 kg)  06/15/16 195 lb 12.8 oz (88.8 kg)  Physical Exam  Constitutional: He is oriented to person, place, and time. No distress.  Cardiovascular: Normal rate and regular rhythm.   Pulmonary/Chest: Effort normal and breath sounds normal.  Abdominal: Soft. Bowel sounds are normal.  Musculoskeletal: He exhibits no edema or tenderness.       Right hip: Normal.       Right knee: Normal.       Lumbar back: Normal.       Right upper leg: Normal.  Negative straight leg raise  Neurological: He is alert and oriented to person, place, and time.  Skin: Skin is warm and dry. No rash noted.  Normal diabeteic foot exam  Psychiatric: He has a normal mood and affect. His  behavior is normal.  Vitals reviewed.   Lab Results  Component Value Date   WBC 5.1 03/02/2016   HGB 15.6 03/02/2016   HCT 47.1 03/02/2016   PLT 347.0 03/02/2016   GLUCOSE 173 (H) 01/05/2017   CHOL 251 (H) 01/05/2017   TRIG 166.0 (H) 01/05/2017   HDL 40.90 01/05/2017   LDLCALC 177 (H) 01/05/2017   ALT 29 01/05/2017   AST 21 01/05/2017   NA 134 (L) 01/05/2017   K 4.0 01/05/2017   CL 100 01/05/2017   CREATININE 0.97 01/05/2017   BUN 12 01/05/2017   CO2 26 01/05/2017   PSA 0.61 03/02/2016   INR 1.1 10/16/2007   HGBA1C 8.1 (H) 01/05/2017   MICROALBUR 1.2 08/04/2015    No results found.  Assessment & Plan:   [place] was seen today for medication refill.  Diagnoses and all orders for this visit:  Essential hypertension -     Basic metabolic panel; Future -     enalapril (VASOTEC) 20 MG tablet; Take 1 tablet (20 mg total) by mouth daily.  Type 2 diabetes mellitus without complication, without long-term current use of insulin ([facility]) -     Hemoglobin A1c; Future -     Dulaglutide (TRULICITY) 0.26 VZ/8.5YI SOPN; Inject 0.75 mg into the skin once a week. -     metFORMIN (GLUCOPHAGE-XR) 500 MG 24 hr tablet; Take 2 tablets (1,000 mg total) by mouth daily with breakfast.  Chronic right-sided low back pain without sciatica  Mixed hyperlipidemia -     Hepatic function panel; Future -     Lipid panel; Future -     atorvastatin (LIPITOR) 40 MG tablet; Take 1 tablet (40 mg total) by mouth daily at 6 PM.  Gout of multiple sites, unspecified cause, unspecified chronicity -     Uric acid; Future -     allopurinol (ZYLOPRIM) 100 MG tablet; Take 1 tablet (100 mg total) by mouth daily.  Need for influenza vaccination -     Flu vaccine HIGH DOSE PF   I have discontinued Mr. Hanser colchicine. I have also changed his allopurinol and enalapril. Additionally, I am having him start on atorvastatin. Lastly, I am having him maintain his ONE TOUCH ULTRA 2, onetouch ultrasoft, glucose  blood, indomethacin, multivitamin, ibuprofen, fluconazole, Dulaglutide, and metFORMIN. We will continue to administer sodium chloride.  Meds ordered this encounter  Medications  . Dulaglutide (TRULICITY) 5.02 DX/4.1OI SOPN    Sig: Inject 0.75 mg into the skin once a week.    Dispense:  4 pen    Refill:  5    Order Specific Question:   Supervising Provider    Answer:   Cassandria Anger [[id number]]  . metFORMIN (GLUCOPHAGE-XR) 500 MG 24 hr tablet    Sig:  Take 2 tablets (1,000 mg total) by mouth daily with breakfast.    Dispense:  90 tablet    Refill:  1    Order Specific Question:   Supervising Provider    Answer:   Cassandria Anger [[id number]]  . allopurinol (ZYLOPRIM) 100 MG tablet    Sig: Take 1 tablet (100 mg total) by mouth daily.    Dispense:  90 tablet    Refill:  1    Order Specific Question:   Supervising Provider    Answer:   Cassandria Anger [[id number]]  . enalapril (VASOTEC) 20 MG tablet    Sig: Take 1 tablet (20 mg total) by mouth daily.    Dispense:  90 tablet    Refill:  1    Order Specific Question:   Supervising Provider    Answer:   Cassandria Anger [[id number]]  . atorvastatin (LIPITOR) 40 MG tablet    Sig: Take 1 tablet (40 mg total) by mouth daily at 6 PM.    Dispense:  90 tablet    Refill:  1    Order Specific Question:   Supervising Provider    Answer:   Cassandria Anger [[id number]]    Follow-up: Return in about 6 months (around 07/06/2017) for DM and HTN, hyperlipidemia (with new NP).  Wilfred Lacy, NP

## 2017-01-05 NOTE — Patient Instructions (Addendum)
Improve hgbA1c: 12 to 8.1. Stable renal and liver function. Normal uric acid level. Lipid panel indicates persistent elevation in total cholesterol, triglyceride, and LDL. I recommend use of cholesterol lowering agent due to high risk of cardiovascular disease. Rx sent  Alternate between warm and cold compress as needed.  Back Exercises The following exercises strengthen the muscles that help to support the back. They also help to keep the lower back flexible. Doing these exercises can help to prevent back pain or lessen existing pain. If you have back pain or discomfort, try doing these exercises 2-3 times each day or as told by your health care provider. When the pain goes away, do them once each day, but increase the number of times that you repeat the steps for each exercise (do more repetitions). If you do not have back pain or discomfort, do these exercises once each day or as told by your health care provider. Exercises Single Knee to Chest  Repeat these steps 3-5 times for each leg: 1. Lie on your back on a firm bed or the floor with your legs extended. 2. Bring one knee to your chest. Your other leg should stay extended and in contact with the floor. 3. Hold your knee in place by grabbing your knee or thigh. 4. Pull on your knee until you feel a gentle stretch in your lower back. 5. Hold the stretch for 10-30 seconds. 6. Slowly release and straighten your leg.  Pelvic Tilt  Repeat these steps 5-10 times: 1. Lie on your back on a firm bed or the floor with your legs extended. 2. Bend your knees so they are pointing toward the ceiling and your feet are flat on the floor. 3. Tighten your lower abdominal muscles to press your lower back against the floor. This motion will tilt your pelvis so your tailbone points up toward the ceiling instead of pointing to your feet or the floor. 4. With gentle tension and even breathing, hold this position for 5-10 seconds.  Cat-Cow  Repeat these  steps until your lower back becomes more flexible: 1. Get into a hands-and-knees position on a firm surface. Keep your hands under your shoulders, and keep your knees under your hips. You may place padding under your knees for comfort. 2. Let your head hang down, and point your tailbone toward the floor so your lower back becomes rounded like the back of a cat. 3. Hold this position for 5 seconds. 4. Slowly lift your head and point your tailbone up toward the ceiling so your back forms a sagging arch like the back of a cow. 5. Hold this position for 5 seconds.  Press-Ups  Repeat these steps 5-10 times: 1. Lie on your abdomen (face-down) on the floor. 2. Place your palms near your head, about shoulder-width apart. 3. While you keep your back as relaxed as possible and keep your hips on the floor, slowly straighten your arms to raise the top half of your body and lift your shoulders. Do not use your back muscles to raise your upper torso. You may adjust the placement of your hands to make yourself more comfortable. 4. Hold this position for 5 seconds while you keep your back relaxed. 5. Slowly return to lying flat on the floor.  Bridges  Repeat these steps 10 times: 1. Lie on your back on a firm surface. 2. Bend your knees so they are pointing toward the ceiling and your feet are flat on the floor. 3. Tighten your  buttocks muscles and lift your buttocks off of the floor until your waist is at almost the same height as your knees. You should feel the muscles working in your buttocks and the back of your thighs. If you do not feel these muscles, slide your feet 1-2 inches farther away from your buttocks. 4. Hold this position for 3-5 seconds. 5. Slowly lower your hips to the starting position, and allow your buttocks muscles to relax completely.  If this exercise is too easy, try doing it with your arms crossed over your chest. Abdominal Crunches  Repeat these steps 5-10 times: 1. Lie on  your back on a firm bed or the floor with your legs extended. 2. Bend your knees so they are pointing toward the ceiling and your feet are flat on the floor. 3. Cross your arms over your chest. 4. Tip your chin slightly toward your chest without bending your neck. 5. Tighten your abdominal muscles and slowly raise your trunk (torso) high enough to lift your shoulder blades a tiny bit off of the floor. Avoid raising your torso higher than that, because it can put too much stress on your low back and it does not help to strengthen your abdominal muscles. 6. Slowly return to your starting position.  Back Lifts Repeat these steps 5-10 times: 1. Lie on your abdomen (face-down) with your arms at your sides, and rest your forehead on the floor. 2. Tighten the muscles in your legs and your buttocks. 3. Slowly lift your chest off of the floor while you keep your hips pressed to the floor. Keep the back of your head in line with the curve in your back. Your eyes should be looking at the floor. 4. Hold this position for 3-5 seconds. 5. Slowly return to your starting position.  Contact a health care provider if:  Your back pain or discomfort gets much worse when you do an exercise.  Your back pain or discomfort does not lessen within 2 hours after you exercise. If you have any of these problems, stop doing these exercises right away. Do not do them again unless your health care provider says that you can. Get help right away if:  You develop sudden, severe back pain. If this happens, stop doing the exercises right away. Do not do them again unless your health care provider says that you can. This information is not intended to replace advice given to you by your health care provider. Make sure you discuss any questions you have with your health care provider. Document Released: 04/29/2004 Document Revised: 07/30/2015 Document Reviewed: 05/16/2014 Elsevier Interactive Patient Education  2017 [employer].

## 2017-01-06 DIAGNOSIS — E782 Mixed hyperlipidemia: Secondary | ICD-10-CM | POA: Insufficient documentation

## 2017-01-06 LAB — HEMOGLOBIN A1C: Hgb A1c MFr Bld: 8.1 % — ABNORMAL HIGH (ref 4.6–6.5)

## 2017-01-06 MED ORDER — ALLOPURINOL 100 MG PO TABS: 100.0000 mg | ORAL_TABLET | Freq: Every day | ORAL | 1 refills | Status: DC

## 2017-01-06 MED ORDER — ENALAPRIL MALEATE 20 MG PO TABS: 20.0000 mg | ORAL_TABLET | Freq: Every day | ORAL | 1 refills | Status: DC

## 2017-01-06 MED ORDER — DULAGLUTIDE 0.75 MG/0.5ML ~~LOC~~ SOAJ: 0.7500 mg | SUBCUTANEOUS | 5 refills | Status: DC

## 2017-01-06 MED ORDER — ATORVASTATIN CALCIUM 40 MG PO TABS: 40.0000 mg | ORAL_TABLET | Freq: Every day | ORAL | 1 refills | Status: DC

## 2017-01-06 MED ORDER — METFORMIN HCL ER 500 MG PO TB24: 1000.0000 mg | ORAL_TABLET | Freq: Every day | ORAL | 1 refills | Status: DC

## 2017-01-06 NOTE — Assessment & Plan Note (Signed)
Started on atorvastatin.  Lipid Panel     Component Value Date/Time   CHOL 251 (H) 01/05/2017 1651   TRIG 166.0 (H) 01/05/2017 1651   HDL 40.90 01/05/2017 1651   CHOLHDL 6 01/05/2017 1651   VLDL 33.2 01/05/2017 1651   LDLCALC 177 (H) 01/05/2017 1651

## 2017-02-22 ENCOUNTER — Ambulatory Visit: Payer: 59 | Admitting: Nurse Practitioner

## 2017-02-22 ENCOUNTER — Encounter: Payer: Self-pay | Admitting: Nurse Practitioner

## 2017-02-22 VITALS — BP 126/84 | HR 106 | Temp 98.9°F | Resp 16 | Ht 65.0 in | Wt 203.8 lb

## 2017-02-22 DIAGNOSIS — Z0000 Encounter for general adult medical examination without abnormal findings: Secondary | ICD-10-CM
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
## 2017-02-22 NOTE — Assessment & Plan Note (Signed)
TDAP, influenza up to date. He'd like to check with insurance company prior to pneumonia vaccination. Colonoscopy up to date. Diabetic foot exam, eye exam up to date. Health maintenance up to date.  Health diet and exercise discussed including reduced red meat and 1/2 plate of veggies at meals, getting 150 minutes of exercise per week. Sunscreen, seatbelt discussed.  A1c, lipid screening, HIV screening up to date. Preventive care handout given.

## 2017-02-22 NOTE — Patient Instructions (Addendum)
Please work on your diet and exercise as we discussed. Remember half of your plate should be veggies, one-fourth carbs, one-fourth meat, and don't eat meat at every meal. Also, remember to stay away from sugary drinks. You should get 150 minutes of exercise each week,or 30 minutes five times a week.   Id like to see you back in 3 months, we can recheck your a1c and lipids.  It was nice to meet you. Thanks for letting me take care of you today :)  Preventive Care 40-64 Years, Male Preventive care refers to lifestyle choices and visits with your health care provider that can promote health and wellness. What does preventive care include?  A yearly physical exam. This is also called an annual well check.  Dental exams once or twice a year.  Routine eye exams. Ask your health care provider how often you should have your eyes checked.  Personal lifestyle choices, including: ? Daily care of your teeth and gums. ? Regular physical activity. ? Eating a healthy diet. ? Avoiding tobacco and drug use. ? Limiting alcohol use. ? Practicing safe sex. ? Taking low-dose aspirin every day starting at age 34. What happens during an annual well check? The services and screenings done by your health care provider during your annual well check will depend on your age, overall health, lifestyle risk factors, and family history of disease. Counseling Your health care provider may ask you questions about your:  Alcohol use.  Tobacco use.  Drug use.  Emotional well-being.  Home and relationship well-being.  Sexual activity.  Eating habits.  Work and work Statistician.  Screening You may have the following tests or measurements:  Height, weight, and BMI.  Blood pressure.  Lipid and cholesterol levels. These may be checked every 5 years, or more frequently if you are over 18 years old.  Skin check.  Lung cancer screening. You may have this screening every year starting at age 79 if you  have a 30-pack-year history of smoking and currently smoke or have quit within the past 15 years.  Fecal occult blood test (FOBT) of the stool. You may have this test every year starting at age 18.  Flexible sigmoidoscopy or colonoscopy. You may have a sigmoidoscopy every 5 years or a colonoscopy every 10 years starting at age 48.  Prostate cancer screening. Recommendations will vary depending on your family history and other risks.  Hepatitis C blood test.  Hepatitis B blood test.  Sexually transmitted disease (STD) testing.  Diabetes screening. This is done by checking your blood sugar (glucose) after you have not eaten for a while (fasting). You may have this done every 1-3 years.  Discuss your test results, treatment options, and if necessary, the need for more tests with your health care provider. Vaccines Your health care provider may recommend certain vaccines, such as:  Influenza vaccine. This is recommended every year.  Tetanus, diphtheria, and acellular pertussis (Tdap, Td) vaccine. You may need a Td booster every 10 years.  Varicella vaccine. You may need this if you have not been vaccinated.  Zoster vaccine. You may need this after age 66.  Measles, mumps, and rubella (MMR) vaccine. You may need at least one dose of MMR if you were born in 1957 or later. You may also need a second dose.  Pneumococcal 13-valent conjugate (PCV13) vaccine. You may need this if you have certain conditions and have not been vaccinated.  Pneumococcal polysaccharide (PPSV23) vaccine. You may need one or two  doses if you smoke cigarettes or if you have certain conditions.  Meningococcal vaccine. You may need this if you have certain conditions.  Hepatitis A vaccine. You may need this if you have certain conditions or if you travel or work in places where you may be exposed to hepatitis A.  Hepatitis B vaccine. You may need this if you have certain conditions or if you travel or work in  places where you may be exposed to hepatitis B.  Haemophilus influenzae type b (Hib) vaccine. You may need this if you have certain risk factors.  Talk to your health care provider about which screenings and vaccines you need and how often you need them. This information is not intended to replace advice given to you by your health care provider. Make sure you discuss any questions you have with your health care provider. Document Released: 04/18/2015 Document Revised: 12/10/2015 Document Reviewed: 01/21/2015 Elsevier Interactive Patient Education  2017 [employer].

## 2017-02-23 ENCOUNTER — Ambulatory Visit: Payer: BLUE CROSS/BLUE SHIELD | Admitting: Nurse Practitioner

## 2017-05-24 ENCOUNTER — Ambulatory Visit: Payer: 59 | Admitting: Nurse Practitioner

## 2017-05-24 DIAGNOSIS — Z0289 Encounter for other administrative examinations: Secondary | ICD-10-CM

## 2017-07-28 ENCOUNTER — Encounter: Payer: Self-pay | Admitting: Nurse Practitioner

## 2017-07-28 ENCOUNTER — Ambulatory Visit: Payer: 59 | Admitting: Nurse Practitioner

## 2017-07-28 ENCOUNTER — Other Ambulatory Visit (INDEPENDENT_AMBULATORY_CARE_PROVIDER_SITE_OTHER): Payer: 59

## 2017-07-28 VITALS — BP 126/88 | HR 68 | Temp 98.4°F | Resp 16 | Ht 65.0 in | Wt 199.0 lb

## 2017-07-28 DIAGNOSIS — M79645 Pain in left finger(s): Secondary | ICD-10-CM | POA: Diagnosis not present

## 2017-07-28 DIAGNOSIS — E119 Type 2 diabetes mellitus without complications: Secondary | ICD-10-CM

## 2017-07-28 DIAGNOSIS — I10 Essential (primary) hypertension: Secondary | ICD-10-CM

## 2017-07-28 DIAGNOSIS — E782 Mixed hyperlipidemia: Secondary | ICD-10-CM

## 2017-07-28 LAB — LIPID PANEL
Cholesterol: 215 mg/dL — ABNORMAL HIGH (ref 0–200)
HDL: 39.8 mg/dL (ref >39.00)
LDL Cholesterol: 153 mg/dL — ABNORMAL HIGH (ref 0–99)
NonHDL: 175.08
Total CHOL/HDL Ratio: 5
Triglycerides: 111 mg/dL (ref 0.0–149.0)
VLDL: 22.2 mg/dL (ref 0.0–40.0)

## 2017-07-28 LAB — HEMOGLOBIN A1C: Hgb A1c MFr Bld: 9 % — ABNORMAL HIGH (ref 4.6–6.5)

## 2017-07-28 MED ORDER — ENALAPRIL MALEATE 20 MG PO TABS: 20.0000 mg | ORAL_TABLET | Freq: Every day | ORAL | 1 refills | Status: DC

## 2017-07-28 NOTE — Patient Instructions (Addendum)
Please start 81 mg aspirin daily to protect you from a heart attack or stroke. Please head downstairs for lab work/x-rays. If any of your test results are critically abnormal, you will be contacted right away. Your results may be released to your MyChart for viewing before I am able to provide you with my response. I will contact you within a week about your test results and any recommendations for abnormalities.  Please schedule a follow up appointment with Dr Jordan LikesSchmitz or Katrinka BlazingSmith for possible hand injection.  I will let you know when to follow up next when I get your lab work back.

## 2017-07-28 NOTE — Assessment & Plan Note (Signed)
stable, continue current meds - enalapril (VASOTEC) 20 MG tablet; Take 1 tablet (20 mg total) by mouth daily.  Dispense: 90 tablet; Refill: 1

## 2017-07-28 NOTE — Progress Notes (Signed)
Name: Ryan Rhodes   MRN: [id number]    DOB: 03-31-1961   Date:07/28/2017       Progress Note  Subjective  Chief Complaint  Chief Complaint  Patient presents with  . Hand Pain    has had what he thinks is trigger finger on left hand  . Hyperlipidemia  . Diabetes    HPI  Left hand pain-  This is an acute problem This problem began about 1 month ago He has tingling pain in his left thumb and pointer finger and he feels like the fingers are "locking up" The symptoms occur throughout the day when he tries to move his fingers, This Feels like when hehad trigger finger in his right hand. Denies  weakness, swelling, erythema, bruising, injuiry  Diabetes- maintained on  Metformin 9509 once daily, trulicity 0.'75mg'$  once weekly He is actually prescribed metformin 1000 BID but he did not realize and has only been taking the metformin once daily He has been taking trulicity as instructed He has not noted any adverse effects to his medications Reports glucse readings around 130-140 at home when he checks occasionally  Denies tremor, diaphoresis, polyuria, polydipsia, polyphagia.  Lab Results  Component Value Date   HGBA1C 8.1 (H) 01/05/2017   Cholesterol- he was prescribed lipitor last October for elevated cholesterol but he forgot to pick up the prescription. He denies history of heart attack or stroke He is not currently taking aspirin  Lab Results  Component Value Date   CHOL 251 (H) 01/05/2017   HDL 40.90 01/05/2017   LDLCALC 177 (H) 01/05/2017   TRIG 166.0 (H) 01/05/2017   CHOLHDL 6 01/05/2017    Patient Active Problem List   Diagnosis Date Noted  . Mixed hyperlipidemia 01/06/2017  . Balanitis 08/20/2016  . Trigger index finger of left hand 06/15/2016  . Routine general medical examination at a health care facility 03/02/2016  . Type 2 diabetes mellitus ([place]) 08/04/2015  . Gout 08/01/2014  . Essential hypertension 08/01/2014  . Urinary frequency 08/01/2014  .  Umbilical hernia 32/67/1245  . Low back ache 08/01/2014    Social History   Tobacco Use  . Smoking status: Never Smoker  . Smokeless tobacco: Never Used  Substance Use Topics  . Alcohol use: Yes    Comment: Occasional;once or twice weekly     Current Outpatient Medications:  .  allopurinol (ZYLOPRIM) 100 MG tablet, Take 1 tablet (100 mg total) by mouth daily., Disp: 90 tablet, Rfl: 1 .  atorvastatin (LIPITOR) 40 MG tablet, Take 1 tablet (40 mg total) by mouth daily at 6 PM., Disp: 90 tablet, Rfl: 1 .  Blood Glucose Monitoring Suppl (ONE TOUCH ULTRA 2) w/Device KIT, Use the device to check your blood sugars 1-4 times daily as instructed., Disp: 1 each, Rfl: 0 .  Dulaglutide (TRULICITY) 8.09 XI/3.3AS SOPN, Inject 0.75 mg into the skin once a week., Disp: 4 pen, Rfl: 5 .  enalapril (VASOTEC) 20 MG tablet, Take 1 tablet (20 mg total) by mouth daily., Disp: 90 tablet, Rfl: 1 .  glucose blood (ONE TOUCH ULTRA TEST) test strip, Use as instructed, Disp: 100 each, Rfl: 12 .  ibuprofen (ADVIL,MOTRIN) 400 MG tablet, Take 400 mg by mouth every 6 (six) hours as needed., Disp: , Rfl:  .  indomethacin (INDOCIN) 50 MG capsule, TAKE 1 CAPSULE TWICE A DAY AS NEEDED, Disp: 180 capsule, Rfl: 0 .  Lancets (ONETOUCH ULTRASOFT) lancets, Use as instructed, Disp: 100 each, Rfl: 12 .  metFORMIN (GLUCOPHAGE-XR)  500 MG 24 hr tablet, Take 2 tablets (1,000 mg total) by mouth daily with breakfast., Disp: 90 tablet, Rfl: 1 .  Multiple Vitamin (MULTIVITAMIN) tablet, Take 1 tablet by mouth daily., Disp: , Rfl:   Current Facility-Administered Medications:  .  0.9 %  sodium chloride infusion, 500 mL, Intravenous, Continuous, Nandigam, Kavitha V, MD  No Known Allergies  ROS   No other specific complaints in a complete review of systems (except as listed in HPI above).  Objective  Vitals:   07/28/17 1058  BP: 126/88  Pulse: 68  Resp: 16  Temp: 98.4 F (36.9 C)  TempSrc: Oral  SpO2: 98%  Weight: 199 lb  (90.3 kg)  Height: '5\' 5"'$  (1.651 m)   Body mass index is 33.12 kg/m.  Nursing Note and Vital Signs reviewed.  Physical Exam Vital signs reviewed Constitutional: Patient appears well-developed and well-nourished. No distress.  HEENT: head atraumatic, normocephalic, pupils equal and reactive to light, EOM's intact, neck supple without lymphadenopathy, oropharynx pink and moist without exudate Cardiovascular: Normal rate, regular rhythm, S1/S2 present.  Distal pulses intact Pulmonary/Chest: Effort normal and breath sounds clear. No respiratory distress or retractions. Musculoskeletal: No gross deformities. Neurological: he is alert and oriented to person, place, and time. Coordination, balance, strength, speech and gait are normal.  Skin: Skin is warm and dry. No rash noted. No erythema.  Psychiatric: Patient has a normal mood and affect. behavior is normal. Judgment and thought content normal.   Assessment & Plan F/U TBD pending lab results   Pain of finger of left hand Discussed F/U with sports medicine for further evaluation and management- he was instructed to make appointment with sports medicine providers in our office at discharge desk Discussed routine management of trigger finger including reduced usage of digits and return precautions and Educational Handout provided

## 2017-07-28 NOTE — Assessment & Plan Note (Signed)
Update A1c today and F/u with further recommendations pending results Continue medication at current dosages for now - Hemoglobin A1c; Future

## 2017-07-28 NOTE — Assessment & Plan Note (Signed)
he is fasting update lipid panel today and F/u with further recommendations Recommend 81 asa daily to prevent heart attack, stroke - Lipid panel; Future

## 2017-08-02 ENCOUNTER — Ambulatory Visit: Payer: 59 | Admitting: Family Medicine
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
## 2017-08-03 ENCOUNTER — Other Ambulatory Visit: Payer: Self-pay | Admitting: Family

## 2017-08-03 DIAGNOSIS — E782 Mixed hyperlipidemia: Secondary | ICD-10-CM

## 2017-08-03 DIAGNOSIS — E119 Type 2 diabetes mellitus without complications: Secondary | ICD-10-CM

## 2017-08-03 MED ORDER — ATORVASTATIN CALCIUM 40 MG PO TABS: 40.0000 mg | ORAL_TABLET | Freq: Every day | ORAL | 1 refills | Status: DC

## 2017-08-03 MED ORDER — DULAGLUTIDE 1.5 MG/0.5ML ~~LOC~~ SOAJ: SUBCUTANEOUS | 2 refills | Status: DC

## 2017-08-08 ENCOUNTER — Encounter: Payer: Self-pay | Admitting: Family Medicine

## 2017-08-08 ENCOUNTER — Ambulatory Visit: Payer: 59 | Admitting: Family Medicine

## 2017-08-08 ENCOUNTER — Ambulatory Visit: Payer: Self-pay

## 2017-08-08 VITALS — BP 138/78 | HR 107 | Ht 65.0 in | Wt 198.0 lb

## 2017-08-08 DIAGNOSIS — M65341 Trigger finger, right ring finger: Secondary | ICD-10-CM | POA: Insufficient documentation

## 2017-08-08 DIAGNOSIS — M65312 Trigger thumb, left thumb: Secondary | ICD-10-CM

## 2017-08-08 DIAGNOSIS — M65322 Trigger finger, left index finger: Secondary | ICD-10-CM | POA: Diagnosis not present

## 2017-08-08 DIAGNOSIS — M65319 Trigger thumb, unspecified thumb: Secondary | ICD-10-CM | POA: Insufficient documentation

## 2017-08-08 DIAGNOSIS — M6530 Trigger finger, unspecified finger: Secondary | ICD-10-CM | POA: Insufficient documentation

## 2017-08-08 NOTE — Assessment & Plan Note (Signed)
Triggering of the thumb. 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  - injection today  - splint  - counseled on avoiding aggravating maneuvers - f/u in 4 weeks.

## 2017-08-08 NOTE — Patient Instructions (Signed)
Nice to meet you  Please try to wear the brace at work and at night  Please follow up with me in 4 weeks if there is no improvement
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
## 2017-08-08 NOTE — Assessment & Plan Note (Signed)
Triggering of the index finger. 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  - injection today  - splint  - counseled on avoiding aggravating maneuvers - f/u in 4 weeks.

## 2017-08-17 ENCOUNTER — Other Ambulatory Visit: Payer: Self-pay | Admitting: Nurse Practitioner

## 2017-08-17 DIAGNOSIS — E119 Type 2 diabetes mellitus without complications: Secondary | ICD-10-CM

## 2017-10-03 ENCOUNTER — Other Ambulatory Visit: Payer: Self-pay

## 2017-10-03 DIAGNOSIS — M109 Gout, unspecified: Secondary | ICD-10-CM

## 2017-10-03 DIAGNOSIS — E119 Type 2 diabetes mellitus without complications: Secondary | ICD-10-CM

## 2017-10-03 MED ORDER — METFORMIN HCL ER 500 MG PO TB24: ORAL_TABLET | ORAL | 0 refills | Status: DC

## 2017-10-20 ENCOUNTER — Telehealth: Payer: Self-pay | Admitting: Nurse Practitioner

## 2017-10-20 NOTE — Telephone Encounter (Signed)
Copied from CRM [id number]. Topic: Quick Communication - See Telephone Encounter >> Oct 20, 2017  1:56 PM Windy KalataMichael, Miklo Aken L, NT wrote: CRM for notification. See Telephone encounter for: 10/20/17.  Patient is calling and requesting a refill on metFORMIN (GLUCOPHAGE-XR) 500 MG 24 hr tablet [. Please advise.  [facility] [phone] - [place], [place] - [address] [place] [phone] Phone: [phone] Fax: [phone]

## 2017-10-21 ENCOUNTER — Other Ambulatory Visit: Payer: Self-pay | Admitting: *Deleted

## 2017-10-21 ENCOUNTER — Other Ambulatory Visit: Payer: Self-pay | Admitting: Nurse Practitioner

## 2017-10-21 DIAGNOSIS — E119 Type 2 diabetes mellitus without complications: Secondary | ICD-10-CM

## 2017-10-21 MED ORDER — METFORMIN HCL ER 500 MG PO TB24: ORAL_TABLET | ORAL | 0 refills | Status: DC

## 2017-10-21 NOTE — Telephone Encounter (Signed)
Rx forwarded to local pharmacy per patient request- due labs- August

## 2017-12-06 ENCOUNTER — Other Ambulatory Visit: Payer: Self-pay

## 2017-12-06 ENCOUNTER — Telehealth: Payer: Self-pay | Admitting: Nurse Practitioner

## 2017-12-06 DIAGNOSIS — E782 Mixed hyperlipidemia: Secondary | ICD-10-CM

## 2017-12-06 DIAGNOSIS — M109 Gout, unspecified: Secondary | ICD-10-CM

## 2017-12-06 MED ORDER — ATORVASTATIN CALCIUM 40 MG PO TABS: 40.0000 mg | ORAL_TABLET | Freq: Every day | ORAL | 0 refills | Status: DC

## 2017-12-06 MED ORDER — INDOMETHACIN 50 MG PO CAPS: ORAL_CAPSULE | ORAL | 0 refills | Status: DC

## 2017-12-06 NOTE — Telephone Encounter (Signed)
Indocin refill Last Refill:01/12/16 # 180 with 0 refill Last OV: 07/28/17 PCP: Doyce Para ordered last refill Pharmacy:[facility]

## 2017-12-06 NOTE — Telephone Encounter (Signed)
Copied from CRM [id number]. Topic: Quick Communication - Rx Refill/Question >> Dec 06, 2017 12:11 PM Alexander Bergeron B wrote: Medication: indomethacin (INDOCIN) 50 MG capsule [[id number]]  atorvastatin (LIPITOR) 40 MG tablet [[id number]]   Has the patient contacted their pharmacy? Yes.   (Agent: If no, request that the patient contact the pharmacy for the refill.) (Agent: If yes, when and what did the pharmacy advise?)  [facility]erred [facility] (with phone number [facility] street [facility]  Agent: Please be advised that RX refills may take up to 3 business days. We ask that you follow-up with your pharmacy.

## 2017-12-06 NOTE — Telephone Encounter (Signed)
Reviewed chart pt is up-to-date sent refills to [facility].../lmb  

## 2018-01-06 ENCOUNTER — Ambulatory Visit: Payer: 59 | Admitting: Nurse Practitioner

## 2018-01-06 ENCOUNTER — Encounter: Payer: Self-pay | Admitting: Nurse Practitioner

## 2018-01-06 VITALS — BP 120/82 | HR 83 | Ht 65.0 in | Wt 197.0 lb

## 2018-01-06 DIAGNOSIS — Z23 Encounter for immunization: Secondary | ICD-10-CM

## 2018-01-06 DIAGNOSIS — R1310 Dysphagia, unspecified: Secondary | ICD-10-CM

## 2018-01-06 DIAGNOSIS — E119 Type 2 diabetes mellitus without complications: Secondary | ICD-10-CM

## 2018-01-06 DIAGNOSIS — K219 Gastro-esophageal reflux disease without esophagitis: Secondary | ICD-10-CM

## 2018-01-06 MED ORDER — OMEPRAZOLE 40 MG PO CPDR: 40.0000 mg | DELAYED_RELEASE_CAPSULE | Freq: Every day | ORAL | 1 refills | Status: DC

## 2018-01-06 NOTE — Assessment & Plan Note (Addendum)
A1c reviewed in care everywhere- last reading was 6.7 on 09/20/17 by employer health Continue current medications RTC in about 2 months for CPE, routine A1c
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
## 2018-01-06 NOTE — Patient Instructions (Addendum)
Please start prilosec as discussed  Follow up with GI   Food Choices for Gastroesophageal Reflux Disease, Adult When you have gastroesophageal reflux disease (GERD), the foods you eat and your eating habits are very important. Choosing the right foods can help ease your discomfort. What guidelines do I need to follow?  Choose fruits, vegetables, whole grains, and low-fat dairy products.  Choose low-fat meat, fish, and poultry.  Limit fats such as oils, salad dressings, butter, nuts, and avocado.  Keep a food diary. This helps you identify foods that cause symptoms.  Avoid foods that cause symptoms. These may be different for everyone.  Eat small meals often instead of 3 large meals a day.  Eat your meals slowly, in a place where you are relaxed.  Limit fried foods.  Cook foods using methods other than frying.  Avoid drinking alcohol.  Avoid drinking large amounts of liquids with your meals.  Avoid bending over or lying down until 2-3 hours after eating. What foods are not recommended? These are some foods and drinks that may make your symptoms worse: Vegetables Tomatoes. Tomato juice. Tomato and spaghetti sauce. Chili peppers. Onion and garlic. Horseradish. Fruits Oranges, grapefruit, and lemon (fruit and juice). Meats High-fat meats, fish, and poultry. This includes hot dogs, ribs, ham, sausage, salami, and bacon. Dairy Whole milk and chocolate milk. Sour cream. Cream. Butter. Ice cream. Cream cheese. Drinks Coffee and tea. Bubbly (carbonated) drinks or energy drinks. Condiments Hot sauce. Barbecue sauce. Sweets/Desserts Chocolate and cocoa. Donuts. Peppermint and spearmint. Fats and Oils High-fat foods. This includes [place] fries and potato chips. Other Vinegar. Strong spices. This includes black pepper, white pepper, red pepper, cayenne, curry powder, cloves, ginger, and chili powder. The items listed above may not be a complete list of foods and drinks to  avoid. Contact your dietitian for more information. This information is not intended to replace advice given to you by your health care provider. Make sure you discuss any questions you have with your health care provider. Document Released: 09/21/2011 Document Revised: 08/28/2015 Document Reviewed: 01/24/2013 Elsevier Interactive Patient Education  2017 [employer].

## 2018-01-25 ENCOUNTER — Other Ambulatory Visit: Payer: Self-pay | Admitting: Nurse Practitioner

## 2018-01-25 DIAGNOSIS — E119 Type 2 diabetes mellitus without complications: Secondary | ICD-10-CM

## 2018-01-25 MED ORDER — DULAGLUTIDE 1.5 MG/0.5ML ~~LOC~~ SOAJ: SUBCUTANEOUS | 2 refills | Status: DC

## 2018-01-25 MED ORDER — METFORMIN HCL ER 500 MG PO TB24: ORAL_TABLET | ORAL | 1 refills | Status: DC

## 2018-01-25 NOTE — Telephone Encounter (Signed)
Copied from CRM [id number]. Topic: Quick Communication - Rx Refill/Question >> Jan 25, 2018 11:33 AM Burchel, Abbi R wrote: Medication: metFORMIN (GLUCOPHAGE-XR) 500 MG 24 hr tablet, Dulaglutide (TRULICITY) 1.5 MG/0.5ML SOPN    Preferred Pharmacy: [facility] [phone] - [place], [place] - [address] [place]  [id number] Phone: [phone] Fax: [phone]   Pt was advised that RX refills may take up to 3 business days. We ask that you follow-up with your pharmacy.

## 2018-01-25 NOTE — Telephone Encounter (Signed)
Review for refills.  Trulicity 1.5 mg/05 ml pen, last refilled 08/03/17  Metformin  500 mg, last refilled on 10/21/17   Over due on labs, creatinine  NOV  03/10/18.  Provider A Aura Camps

## 2018-02-02 ENCOUNTER — Other Ambulatory Visit: Payer: Self-pay | Admitting: Nurse Practitioner

## 2018-02-02 DIAGNOSIS — I10 Essential (primary) hypertension: Secondary | ICD-10-CM

## 2018-02-14 ENCOUNTER — Ambulatory Visit: Payer: 59 | Admitting: Gastroenterology

## 2018-03-10 ENCOUNTER — Ambulatory Visit: Payer: 59 | Admitting: Nurse Practitioner

## 2018-03-16 ENCOUNTER — Ambulatory Visit: Payer: 59 | Admitting: Gastroenterology

## 2018-03-16 ENCOUNTER — Encounter: Payer: Self-pay | Admitting: *Deleted

## 2018-03-22 ENCOUNTER — Ambulatory Visit: Payer: 59 | Admitting: Nurse Practitioner

## 2018-03-22 DIAGNOSIS — Z0289 Encounter for other administrative examinations: Secondary | ICD-10-CM
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
## 2018-04-18 ENCOUNTER — Ambulatory Visit: Payer: 59 | Admitting: Gastroenterology

## 2018-07-11 ENCOUNTER — Telehealth: Payer: Self-pay | Admitting: Family

## 2018-07-11 ENCOUNTER — Telehealth: Payer: Self-pay | Admitting: Nurse Practitioner

## 2018-07-11 DIAGNOSIS — I10 Essential (primary) hypertension: Secondary | ICD-10-CM

## 2018-07-11 DIAGNOSIS — E119 Type 2 diabetes mellitus without complications: Secondary | ICD-10-CM

## 2018-07-11 NOTE — Telephone Encounter (Signed)
Copied from CRM [id number]. Topic: Appointment Scheduling - Transfer of Care >> Jul 11, 2018  2:06 PM Marylen Ponto wrote: Pt is requesting to transfer FROM: Ryan Rhodes Pt is requesting to transfer TO: Ria Clock Reason for requested transfer: Provider no longer available  Send CRM to patient's current PCP (transferring FROM).

## 2018-07-11 NOTE — Telephone Encounter (Signed)
Patient would like to transfer to you from [place]. Would you be willing to see him to establish care?

## 2018-07-11 NOTE — Telephone Encounter (Signed)
Copied from CRM [id number]. Topic: Quick Communication - Rx Refill/Question >> Jul 11, 2018  2:01 PM Mcneil, Ja-Kwan wrote: Medication: enalapril (VASOTEC) 20 MG tablet and metFORMIN (GLUCOPHAGE-XR) 500 MG 24 hr tablet - Pt request 90 day supply  Has the patient contacted their pharmacy? yes   Preferred Pharmacy (with phone number or street name): [facility] [phone] - [place], [place] - [address] [phone] (Phone)  [phone] (Fax)  Agent: Please be advised that RX refills may take up to 3 business days. We ask that you follow-up with your pharmacy.

## 2018-07-12 MED ORDER — METFORMIN HCL ER 500 MG PO TB24: 1000.0000 mg | ORAL_TABLET | Freq: Every day | ORAL | 0 refills | Status: DC

## 2018-07-12 MED ORDER — ENALAPRIL MALEATE 20 MG PO TABS: 20.0000 mg | ORAL_TABLET | Freq: Two times a day (BID) | ORAL | 0 refills | Status: DC

## 2018-07-12 MED ORDER — ENALAPRIL MALEATE 20 MG PO TABS: 20.0000 mg | ORAL_TABLET | Freq: Every day | ORAL | 0 refills | Status: DC

## 2018-07-12 NOTE — Addendum Note (Signed)
Addended by: Merrilyn Puma on: 07/12/2018 11:21 AM   Modules accepted: Orders

## 2018-07-12 NOTE — Telephone Encounter (Signed)
Refills sent. See meds. Patient was due for CPE in 03/2018. Needs appointment for future refills.

## 2018-07-12 NOTE — Telephone Encounter (Signed)
Yes, that is fine; he is way overdue for a diabetes check though. Please schedule virtual visit for next week. He should also come get his labs done 2 days prior to the virtual visit so we can discuss the results.

## 2018-07-13 NOTE — Telephone Encounter (Signed)
Appointment scheduled and informed patient to have labs done prior to visit.

## 2018-07-17 ENCOUNTER — Telehealth: Payer: Self-pay

## 2018-07-17 NOTE — Telephone Encounter (Signed)
Appointment updated and mailed out reminder to him today.

## 2018-07-26 ENCOUNTER — Ambulatory Visit: Payer: 59 | Admitting: Family

## 2018-08-01 ENCOUNTER — Ambulatory Visit: Payer: 59 | Admitting: Family

## 2018-08-16 ENCOUNTER — Telehealth: Payer: Self-pay | Admitting: Family

## 2018-08-16 DIAGNOSIS — M109 Gout, unspecified: Secondary | ICD-10-CM

## 2018-08-16 MED ORDER — INDOMETHACIN 50 MG PO CAPS: ORAL_CAPSULE | ORAL | 0 refills | Status: DC

## 2018-08-16 NOTE — Telephone Encounter (Signed)
Refill sent. Patient needs to establish with new PCP or see another provider for future refills.

## 2018-08-16 NOTE — Telephone Encounter (Signed)
Copied from CRM [id number]. Topic: Quick Communication - Rx Refill/Question >> Aug 16, 2018 11:58 AM Lynne Logan D wrote: Medication: indomethacin (INDOCIN) 50 MG capsule / Pt has had a gout flare up and took his last dose today. Requesting refill  Has the patient contacted their pharmacy? No (Agent: If no, request that the patient contact the pharmacy for the refill.) (Agent: If yes, when and what did the pharmacy advise?)  Preferred Pharmacy (with phone number or street name): [facility] [phone] - [place], [place] - [address] [phone] (Phone) [phone] (Fax)    Agent: Please be advised that RX refills may take up to 3 business days. We ask that you follow-up with your pharmacy.

## 2018-09-15 ENCOUNTER — Telehealth: Payer: Self-pay | Admitting: Family

## 2018-09-15 NOTE — Telephone Encounter (Signed)
Patient is currently out of Trulicity.  He has scheduled another TOC appt for Monday.  He asking for meds to be sent in to get him through.  [facility] on [address].

## 2018-09-15 NOTE — Telephone Encounter (Signed)
He has been out of Trulicity since December according to our records. He was given a 3 month supply in October. We will discuss at his follow-up on Monday.

## 2018-09-15 NOTE — Telephone Encounter (Signed)
Spoke with patient and info given 

## 2018-09-18 ENCOUNTER — Telehealth: Payer: Self-pay | Admitting: Family

## 2018-09-18 ENCOUNTER — Other Ambulatory Visit: Payer: Self-pay

## 2018-09-18 ENCOUNTER — Ambulatory Visit (INDEPENDENT_AMBULATORY_CARE_PROVIDER_SITE_OTHER): Payer: Self-pay | Admitting: Family

## 2018-09-18 ENCOUNTER — Other Ambulatory Visit: Payer: Self-pay | Admitting: Family

## 2018-09-18 ENCOUNTER — Encounter: Payer: Self-pay | Admitting: Family

## 2018-09-18 ENCOUNTER — Other Ambulatory Visit (INDEPENDENT_AMBULATORY_CARE_PROVIDER_SITE_OTHER): Payer: Self-pay

## 2018-09-18 VITALS — BP 130/78 | HR 95 | Temp 98.1°F | Ht 65.0 in | Wt 197.0 lb

## 2018-09-18 DIAGNOSIS — M109 Gout, unspecified: Secondary | ICD-10-CM

## 2018-09-18 DIAGNOSIS — R4689 Other symptoms and signs involving appearance and behavior: Secondary | ICD-10-CM

## 2018-09-18 DIAGNOSIS — E782 Mixed hyperlipidemia: Secondary | ICD-10-CM

## 2018-09-18 DIAGNOSIS — E119 Type 2 diabetes mellitus without complications: Secondary | ICD-10-CM

## 2018-09-18 DIAGNOSIS — Z125 Encounter for screening for malignant neoplasm of prostate: Secondary | ICD-10-CM

## 2018-09-18 LAB — CBC WITH DIFFERENTIAL/PLATELET
Basophils Absolute: 0 10*3/uL (ref 0.0–0.1)
Basophils Relative: 1.1 % (ref 0.0–3.0)
Eosinophils Absolute: 0 10*3/uL (ref 0.0–0.7)
Eosinophils Relative: 1.2 % (ref 0.0–5.0)
HCT: 46.8 % (ref 39.0–52.0)
Hemoglobin: 15.4 g/dL (ref 13.0–17.0)
Lymphocytes Relative: 34.5 % (ref 12.0–46.0)
Lymphs Abs: 1.4 10*3/uL (ref 0.7–4.0)
MCHC: 32.8 g/dL (ref 30.0–36.0)
MCV: 82.2 fl (ref 78.0–100.0)
Monocytes Absolute: 0.7 10*3/uL (ref 0.1–1.0)
Monocytes Relative: 16.8 % — ABNORMAL HIGH (ref 3.0–12.0)
Neutro Abs: 1.9 10*3/uL (ref 1.4–7.7)
Neutrophils Relative %: 46.4 % (ref 43.0–77.0)
Platelets: 339 10*3/uL (ref 150.0–400.0)
RBC: 5.69 Mil/uL (ref 4.22–5.81)
RDW: 14.1 % (ref 11.5–15.5)
WBC: 4.2 10*3/uL (ref 4.0–10.5)

## 2018-09-18 LAB — COMPREHENSIVE METABOLIC PANEL
ALT: 49 U/L (ref 0–53)
AST: 33 U/L (ref 0–37)
Albumin: 4.6 g/dL (ref 3.5–5.2)
Alkaline Phosphatase: 59 U/L (ref 39–117)
BUN: 7 mg/dL (ref 6–23)
CO2: 25 mEq/L (ref 19–32)
Calcium: 9.6 mg/dL (ref 8.4–10.5)
Chloride: 101 mEq/L (ref 96–112)
Creatinine, Ser: 0.92 mg/dL (ref 0.40–1.50)
GFR: 103.07 mL/min (ref >60.00)
Glucose, Bld: 164 mg/dL — ABNORMAL HIGH (ref 70–99)
Potassium: 4.1 mEq/L (ref 3.5–5.1)
Sodium: 136 mEq/L (ref 135–145)
Total Bilirubin: 0.4 mg/dL (ref 0.2–1.2)
Total Protein: 7.8 g/dL (ref 6.0–8.3)

## 2018-09-18 LAB — HEMOGLOBIN A1C: Hgb A1c MFr Bld: 8.3 % — ABNORMAL HIGH (ref 4.6–6.5)

## 2018-09-18 LAB — URIC ACID: Uric Acid, Serum: 7.2 mg/dL (ref 4.0–7.8)

## 2018-09-18 LAB — LIPID PANEL
Cholesterol: 199 mg/dL (ref 0–200)
HDL: 45.6 mg/dL (ref >39.00)
LDL Cholesterol: 137 mg/dL — ABNORMAL HIGH (ref 0–99)
NonHDL: 153.3
Total CHOL/HDL Ratio: 4
Triglycerides: 82 mg/dL (ref 0.0–149.0)
VLDL: 16.4 mg/dL (ref 0.0–40.0)

## 2018-09-18 LAB — PSA: PSA: 0.69 ng/mL (ref 0.10–4.00)

## 2018-09-18 MED ORDER — TRULICITY 1.5 MG/0.5ML ~~LOC~~ SOAJ: SUBCUTANEOUS | 2 refills | Status: DC

## 2018-09-18 MED ORDER — ATORVASTATIN CALCIUM 40 MG PO TABS: 40.0000 mg | ORAL_TABLET | Freq: Every day | ORAL | 0 refills | Status: DC
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
## 2018-09-18 NOTE — Telephone Encounter (Signed)
Copied from [place] [id number]. Topic: Quick Communication - Rx Refill/Question >> Sep 18, 2018  3:45 PM Rainey Pines A wrote: Medication: Dulaglutide (TRULICITY) 1.5 LG/9.2JJ SOPN   Has the patient contacted their pharmacy? Yes (Agent: If no, request that the patient contact the pharmacy for the refill.) (Agent: If yes, when and what did the pharmacy advise?)Contact PCP  Preferred Pharmacy (with phone number or street name): [facility] [phone] - [place], [place] - [address] [phone] (Phone) [phone] (Fax)    Agent: Please be advised that RX refills may take up to 3 business days. We ask that you follow-up with your pharmacy.

## 2018-09-19 NOTE — Telephone Encounter (Signed)
Script was faxed yesterday.

## 2018-09-19 NOTE — Telephone Encounter (Signed)
Spoke with patient today and advised he check with pharmacy since script was faxed in yesterday.

## 2019-03-29 ENCOUNTER — Other Ambulatory Visit: Payer: Self-pay | Admitting: Internal Medicine

## 2019-03-29 DIAGNOSIS — M109 Gout, unspecified: Secondary | ICD-10-CM

## 2019-05-02 ENCOUNTER — Ambulatory Visit: Payer: 59 | Attending: Internal Medicine

## 2019-05-02 DIAGNOSIS — Z20822 Contact with and (suspected) exposure to covid-19: Secondary | ICD-10-CM

## 2019-05-04 LAB — NOVEL CORONAVIRUS, NAA: SARS-CoV-2, NAA: NOT DETECTED

## 2019-06-05 ENCOUNTER — Other Ambulatory Visit: Payer: Self-pay | Admitting: Internal Medicine

## 2019-06-05 DIAGNOSIS — I10 Essential (primary) hypertension: Secondary | ICD-10-CM

## 2019-06-06 NOTE — Telephone Encounter (Signed)
He is overdue for OV- was due to be seen here in September.  No further refills of his medications should be given without OV.

## 2019-06-07 NOTE — Telephone Encounter (Signed)
Unable to reach patient today. Mailed out letter to home address listed for patient to call and schedule follow up appointment.

## 2019-06-29 ENCOUNTER — Encounter: Payer: Self-pay | Admitting: Family

## 2019-06-29 ENCOUNTER — Other Ambulatory Visit: Payer: Self-pay | Admitting: Family

## 2019-06-29 ENCOUNTER — Ambulatory Visit (INDEPENDENT_AMBULATORY_CARE_PROVIDER_SITE_OTHER): Payer: 59 | Admitting: Family

## 2019-06-29 ENCOUNTER — Other Ambulatory Visit: Payer: Self-pay

## 2019-06-29 VITALS — BP 130/90 | HR 90 | Temp 98.0°F | Ht 65.0 in | Wt 191.0 lb

## 2019-06-29 DIAGNOSIS — I10 Essential (primary) hypertension: Secondary | ICD-10-CM

## 2019-06-29 DIAGNOSIS — M25511 Pain in right shoulder: Secondary | ICD-10-CM

## 2019-06-29 DIAGNOSIS — E782 Mixed hyperlipidemia: Secondary | ICD-10-CM

## 2019-06-29 DIAGNOSIS — R1310 Dysphagia, unspecified: Secondary | ICD-10-CM

## 2019-06-29 DIAGNOSIS — E119 Type 2 diabetes mellitus without complications: Secondary | ICD-10-CM | POA: Diagnosis not present

## 2019-06-29 DIAGNOSIS — Z8739 Personal history of other diseases of the musculoskeletal system and connective tissue: Secondary | ICD-10-CM | POA: Diagnosis not present

## 2019-06-29 DIAGNOSIS — K219 Gastro-esophageal reflux disease without esophagitis: Secondary | ICD-10-CM

## 2019-06-29 DIAGNOSIS — Z9114 Patient's other noncompliance with medication regimen: Secondary | ICD-10-CM

## 2019-06-29 LAB — CBC WITH DIFFERENTIAL/PLATELET
Basophils Absolute: 0.1 10*3/uL (ref 0.0–0.1)
Basophils Relative: 1 % (ref 0.0–3.0)
Eosinophils Absolute: 0.1 10*3/uL (ref 0.0–0.7)
Eosinophils Relative: 1.5 % (ref 0.0–5.0)
HCT: 45 % (ref 39.0–52.0)
Hemoglobin: 14.9 g/dL (ref 13.0–17.0)
Lymphocytes Relative: 35.9 % (ref 12.0–46.0)
Lymphs Abs: 2 10*3/uL (ref 0.7–4.0)
MCHC: 33.3 g/dL (ref 30.0–36.0)
MCV: 82.2 fl (ref 78.0–100.0)
Monocytes Absolute: 0.8 10*3/uL (ref 0.1–1.0)
Monocytes Relative: 14.8 % — ABNORMAL HIGH (ref 3.0–12.0)
Neutro Abs: 2.7 10*3/uL (ref 1.4–7.7)
Neutrophils Relative %: 46.8 % (ref 43.0–77.0)
Platelets: 337 10*3/uL (ref 150.0–400.0)
RBC: 5.47 Mil/uL (ref 4.22–5.81)
RDW: 14 % (ref 11.5–15.5)
WBC: 5.7 10*3/uL (ref 4.0–10.5)

## 2019-06-29 LAB — LIPID PANEL
Cholesterol: 197 mg/dL (ref 0–200)
HDL: 41.2 mg/dL (ref >39.00)
LDL Cholesterol: 132 mg/dL — ABNORMAL HIGH (ref 0–99)
NonHDL: 156.26
Total CHOL/HDL Ratio: 5
Triglycerides: 122 mg/dL (ref 0.0–149.0)
VLDL: 24.4 mg/dL (ref 0.0–40.0)

## 2019-06-29 LAB — COMPREHENSIVE METABOLIC PANEL
ALT: 58 U/L — ABNORMAL HIGH (ref 0–53)
AST: 48 U/L — ABNORMAL HIGH (ref 0–37)
Albumin: 4.4 g/dL (ref 3.5–5.2)
Alkaline Phosphatase: 67 U/L (ref 39–117)
BUN: 10 mg/dL (ref 6–23)
CO2: 29 mEq/L (ref 19–32)
Calcium: 9.7 mg/dL (ref 8.4–10.5)
Chloride: 102 mEq/L (ref 96–112)
Creatinine, Ser: 0.95 mg/dL (ref 0.40–1.50)
GFR: 99.05 mL/min (ref >60.00)
Glucose, Bld: 133 mg/dL — ABNORMAL HIGH (ref 70–99)
Potassium: 4.2 mEq/L (ref 3.5–5.1)
Sodium: 137 mEq/L (ref 135–145)
Total Bilirubin: 0.4 mg/dL (ref 0.2–1.2)
Total Protein: 8.1 g/dL (ref 6.0–8.3)

## 2019-06-29 LAB — URIC ACID: Uric Acid, Serum: 8.4 mg/dL — ABNORMAL HIGH (ref 4.0–7.8)

## 2019-06-29 LAB — HEMOGLOBIN A1C: Hgb A1c MFr Bld: 10 % — ABNORMAL HIGH (ref 4.6–6.5)

## 2019-06-29 MED ORDER — ATORVASTATIN CALCIUM 40 MG PO TABS: 40.0000 mg | ORAL_TABLET | Freq: Every day | ORAL | 3 refills | Status: DC

## 2019-06-29 MED ORDER — ALLOPURINOL 100 MG PO TABS: 100.0000 mg | ORAL_TABLET | Freq: Every day | ORAL | 1 refills | Status: DC

## 2019-06-29 MED ORDER — TRULICITY 1.5 MG/0.5ML ~~LOC~~ SOAJ: SUBCUTANEOUS | 3 refills | Status: DC

## 2019-06-29 MED ORDER — ENALAPRIL MALEATE 20 MG PO TABS: 20.0000 mg | ORAL_TABLET | Freq: Every day | ORAL | 1 refills | Status: DC

## 2019-06-29 MED ORDER — METFORMIN HCL ER 500 MG PO TB24: 1000.0000 mg | ORAL_TABLET | Freq: Every day | ORAL | 1 refills | Status: DC

## 2019-06-29 MED ORDER — OMEPRAZOLE 40 MG PO CPDR: 40.0000 mg | DELAYED_RELEASE_CAPSULE | Freq: Every day | ORAL | 1 refills | Status: DC | PRN
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
## 2019-07-02 ENCOUNTER — Ambulatory Visit (INDEPENDENT_AMBULATORY_CARE_PROVIDER_SITE_OTHER): Payer: 59

## 2019-07-02 DIAGNOSIS — M25511 Pain in right shoulder: Secondary | ICD-10-CM

## 2019-07-05 ENCOUNTER — Telehealth: Payer: Self-pay | Admitting: Family

## 2019-07-05 NOTE — Telephone Encounter (Signed)
Pt calling for shoulder x ray results from last week. He was informed to schedule an appt with Sports Med ( which he did ), but he does not know the results of his shoulder xray.

## 2019-07-05 NOTE — Telephone Encounter (Signed)
Called and spoke with patient today. 

## 2019-07-13 ENCOUNTER — Ambulatory Visit: Payer: 59 | Admitting: Family Medicine

## 2019-07-16 ENCOUNTER — Telehealth: Payer: Self-pay | Admitting: Family

## 2019-07-16 NOTE — Telephone Encounter (Signed)
New message:   Pt is calling and states his insurance is needing a prior authorization for Dulaglutide (TRULICITY) 1.5 MG/0.5ML SOPN. He also states his insurance company is sending over a fax with the information as well. Pt states he would like a call to keep him informed with the status of this prior auth.

## 2019-07-17 ENCOUNTER — Telehealth: Payer: Self-pay

## 2019-07-17 NOTE — Telephone Encounter (Signed)
Medication approved and patient notified. 

## 2019-07-17 NOTE — Telephone Encounter (Signed)
(  Key: [id number])  Submitted today via cover my meds.

## 2019-07-17 NOTE — Telephone Encounter (Signed)
Approval received today and patient notified.

## 2019-07-17 NOTE — Telephone Encounter (Signed)
Left message for patient today that prior-authorization had been started.

## 2019-07-17 NOTE — Telephone Encounter (Signed)
[place] (Key: [id number])  [employer] is reviewing your PA request. Typically an electronic response will be received within 72 hours. To check for an update later, open this request from your dashboard.  You may close this dialog and return to your dashboard to perform other tasks.

## 2019-07-17 NOTE — Telephone Encounter (Signed)
(  Key: [id number])  Started on Cover my meds today.

## 2019-07-20 ENCOUNTER — Telehealth: Payer: Self-pay

## 2019-07-20 NOTE — Telephone Encounter (Signed)
Spoke with pharmacy and gave them the savings card number and they stated it would not be at no cost to the pt, pt notified

## 2019-07-20 NOTE — Telephone Encounter (Signed)
New message   Pt c/o medication issue:  1. Name of Medication: Dulaglutide (TRULICITY) 1.5 MG/0.5ML SOPN  2. How are you currently taking this medication (dosage and times per day)? once a week    3. Are you having a reaction (difficulty breathing--STAT)? No   4. What is your medication issue? The cost $ 600.00 looking for alternative medication  - No medication x 1 month

## 2019-07-23 ENCOUNTER — Ambulatory Visit: Payer: 59 | Admitting: Family Medicine

## 2019-07-23 NOTE — Progress Notes (Deleted)
   Subjective:    I'm seeing this patient as a consultation for: Olive Bass, FNP. Note will be routed back to referring provider/PCP.  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 Patient had a R shoulder x-ray 07/02/2019 which showed No fracture or dislocation of the right shoulder. Mild glenohumeral arthrosis. Calcific density in the vicinity of the rotator cuff insertion on the humeral head. Mild acromioclavicular arthrosis. Patient locates pain to . He rates pain as /10 and describes pain as .  Radiates: Aggrivating factors: Tried: Neck pain: RUE weakness: Numbness/tingling:  Past medical history, Surgical history, Family history, Social history, Allergies, and medications have been entered into the medical record, reviewed. ***  Review of Systems: No new headache, visual changes, nausea, vomiting, diarrhea, constipation, dizziness, abdominal pain, skin rash, fevers, chills, night sweats, weight loss, swollen lymph nodes, body aches, joint swelling, muscle aches, chest pain, shortness of breath, mood changes, visual or auditory hallucinations.   Objective:   There were no vitals filed for this visit. General: Well Developed, well nourished, and in no acute distress.  Neuro/Psych: Alert and oriented x3, extra-ocular muscles intact, able to move all 4 extremities, sensation grossly intact. Skin: Warm and dry, no rashes noted.  Respiratory: Not using accessory muscles, speaking in full sentences, trachea midline.  Cardiovascular: Pulses palpable, no extremity edema. Abdomen: Does not appear distended. MSK: ***  Lab and Radiology Results No results found for this or any previous visit (from the past 72 hour(s)). No results found.  Impression and Recommendations:    Assessment and Plan: 59 y.o. male with ***.  PDMP not  reviewed this encounter. No orders of the defined types were placed in this encounter.  No orders of the defined types were placed in this encounter.   Discussed warning signs or symptoms. Please see discharge instructions. Patient expresses understanding.   ***

## 2019-08-01 ENCOUNTER — Ambulatory Visit: Payer: Self-pay

## 2019-08-01 ENCOUNTER — Ambulatory Visit (INDEPENDENT_AMBULATORY_CARE_PROVIDER_SITE_OTHER): Payer: 59 | Admitting: Family Medicine

## 2019-08-01 ENCOUNTER — Other Ambulatory Visit: Payer: Self-pay

## 2019-08-01 ENCOUNTER — Encounter: Payer: Self-pay | Admitting: Family Medicine

## 2019-08-01 VITALS — BP 140/90 | HR 88 | Ht 65.0 in | Wt 192.6 lb

## 2019-08-01 DIAGNOSIS — G8929 Other chronic pain: Secondary | ICD-10-CM | POA: Diagnosis not present

## 2019-08-01 DIAGNOSIS — M25511 Pain in right shoulder: Secondary | ICD-10-CM

## 2019-08-01 DIAGNOSIS — M7581 Other shoulder lesions, right shoulder: Secondary | ICD-10-CM

## 2019-08-01 DIAGNOSIS — E119 Type 2 diabetes mellitus without complications: Secondary | ICD-10-CM

## 2019-08-01 NOTE — Progress Notes (Signed)
Subjective:    CC: R shoulder pain  I, Molly Weber, LAT, ATC, am serving as scribe for Dr. [name].  HPI: Pt is a 59 y/o male presenting w/ c/o R shoulder pain x one year w/ no known MOI.  He locates his pain to his entire R shoulder.  He rates his pain as mild and describes his pain as dull aching.  Radiating pain: yes into his R upper arm R shoulder mechanical symptoms: No Neck pain: No Aggravating factors: R sidelying; pulling-type motions Treatments tried: Aleve; IcyHot  Diagnostic imaging: R shoulder XR- 07/02/19  Pertinent review of Systems: No fever or chills.   Relevant historical information: DM: Doing better now able to get the trulicity.  A1c a month ago was 10.  Recently restarted Trulicity and now blood sugars better controlled.  Currently takes Metformin and Trulicity.  History of biceps tenodesis or repair.  Patient is not sure where this was done but thinks it was done about 5 years ago.  Objective:    Vitals:   08/01/19 1034  BP: 140/90  Pulse: 88  SpO2: 97%   General: Well Developed, well nourished, and in no acute distress.   MSK: Right shoulder: Normal-appearing mature scar anterior shoulder near attachment of short head proximal biceps tendon Range of motion: Abduction limited to 130 degrees. Internal rotation limited lumbar spine. External rotation full. Strength intact abduction external/internal rotation. Positive Hawkins and Neer's test. Mildly positive empty can test. Negative Yergason's and speeds test.    Lab and Radiology Results DG Shoulder Right  Result Date: 07/02/2019 CLINICAL DATA:  Right shoulder pain for 1 month EXAM: RIGHT SHOULDER - 2+ VIEW COMPARISON:  08/17/2009 FINDINGS: No fracture or dislocation of the right shoulder. Mild glenohumeral arthrosis. Calcific density in the vicinity of the rotator cuff insertion on the humeral head. Mild acromioclavicular arthrosis. IMPRESSION: 1. No fracture or dislocation of the  right shoulder. Mild glenohumeral and acromioclavicular arthrosis. 2. Calcific density in the vicinity of the rotator cuff insertion on the humeral head, new compared to prior examination as can be seen in calcific tendonitis. Electronically Signed   By: Lauralyn Primes M.D.   On: 07/02/2019 16:58   I, [name], personally (independently) visualized and performed the interpretation of the images attached in this note.   Diagnostic Limited MSK Ultrasound of: Right shoulder Biceps tendon partially visible.  Longitudinal fibers are visible at bicipital groove unclear partial tear or history tenodesis. Subscapularis tendon irregular footplate however no obvious tear.  Tendon appears to be intact. Supraspinatus tendon: Hyperechoic calcific change at distal insertion visible.  Otherwise tendon is intact.  Increased subacromial bursa thickness present. Infraspinatus tendon: Superior portion also has hyperechoic calcific change at distal tendon insertion. Otherwise tendon is intact and normal appearing Impression: Calcific change distal tendon insertion supraspinatus infraspinatus tendon. Subacromial bursitis.  Procedure: Real-time Ultrasound Guided Injection of right shoulder subacromial bursa Device: Philips Affiniti 50G Images permanently stored and available for review in the ultrasound unit. Verbal informed consent obtained.  Discussed risks and benefits of procedure. Warned about infection bleeding damage to structures skin hypopigmentation and fat atrophy among others. Patient expresses understanding and agreement Time-out conducted.   Noted no overlying erythema, induration, or other signs of local infection.   Skin prepped in a sterile fashion.   Local anesthesia: Topical Ethyl chloride.   With sterile technique and under real time ultrasound guidance:  40 mg of Kenalog and 2 mL of Marcaine injected easily.   Completed  without difficulty   Pain immediately resolved suggesting accurate  placement of the medication.   Advised to call if fevers/chills, erythema, induration, drainage, or persistent bleeding.   Images permanently stored and available for review in the ultrasound unit.  Impression: Technically successful ultrasound guided injection.          Lab Results  Component Value Date   HGBA1C 10.0 (H) 06/29/2019      Impression and Recommendations:    Assessment and Plan: 59 y.o. male with right shoulder pain ongoing for months to 1 year.  X-ray recently obtained does show concern for calcific change at tendon insertion.  This is also seen on ultrasound.  Patient does have subacromial bursitis as well on ultrasound.  Plan for subacromial injection today and physical therapy referral.  Recheck back in 6 weeks.    Also spent time discussing his diabetes especially relationship to the steroid injection today.  As well as tendon and wound healing. Currently on Metformin and GLP-1 agonist.  Could consider adding SGLT2 blocker in the future.  Mention [place] and Jardiance.  Recommend contact PCP if needed in the future.   Orders Placed This Encounter  Procedures  . [place] LIMITED JOINT SPACE STRUCTURES UP RIGHT(NO LINKED CHARGES)    Order Specific Question:   Reason for Exam (SYMPTOM  OR DIAGNOSIS REQUIRED)    Answer:   R shoulder pain    Order Specific Question:   Preferred imaging location?    Answer:   [facility]  . Ambulatory referral to Physical Therapy    Referral Priority:   Routine    Referral Type:   Physical Medicine    Referral Reason:   Specialty Services Required    Requested Specialty:   Physical Therapy   No orders of the defined types were placed in this encounter.   Discussed warning signs or symptoms. Please see discharge instructions. Patient expresses understanding.   The above documentation has been reviewed and is accurate and complete Lynne Leader

## 2019-08-01 NOTE — Patient Instructions (Addendum)
Thank you for coming in today. Attend PT.  Call or go to the ER if you develop a large red swollen joint with extreme pain or oozing puss.   If blood sugar is not controlled enough consider Jardiance or [place].  Ok to ask Ria Clock FNP about them.   Recheck with me in about 6 weeks.  Let me know if you are having a problem.

## 2019-08-28 ENCOUNTER — Ambulatory Visit: Payer: 59 | Admitting: Family

## 2019-08-31 ENCOUNTER — Ambulatory Visit (INDEPENDENT_AMBULATORY_CARE_PROVIDER_SITE_OTHER): Payer: 59 | Admitting: Family

## 2019-08-31 ENCOUNTER — Other Ambulatory Visit: Payer: Self-pay | Admitting: Family

## 2019-08-31 ENCOUNTER — Ambulatory Visit (INDEPENDENT_AMBULATORY_CARE_PROVIDER_SITE_OTHER): Payer: 59

## 2019-08-31 ENCOUNTER — Encounter: Payer: Self-pay | Admitting: Family

## 2019-08-31 ENCOUNTER — Ambulatory Visit: Payer: 59 | Admitting: Family

## 2019-08-31 ENCOUNTER — Other Ambulatory Visit: Payer: Self-pay

## 2019-08-31 VITALS — BP 136/90 | HR 69 | Temp 98.4°F | Ht 65.0 in | Wt 193.0 lb

## 2019-08-31 DIAGNOSIS — M5440 Lumbago with sciatica, unspecified side: Secondary | ICD-10-CM

## 2019-08-31 DIAGNOSIS — E119 Type 2 diabetes mellitus without complications: Secondary | ICD-10-CM

## 2019-08-31 LAB — COMPREHENSIVE METABOLIC PANEL
ALT: 41 U/L (ref 0–53)
AST: 31 U/L (ref 0–37)
Albumin: 4.6 g/dL (ref 3.5–5.2)
Alkaline Phosphatase: 70 U/L (ref 39–117)
BUN: 9 mg/dL (ref 6–23)
CO2: 27 mEq/L (ref 19–32)
Calcium: 9.5 mg/dL (ref 8.4–10.5)
Chloride: 101 mEq/L (ref 96–112)
Creatinine, Ser: 0.88 mg/dL (ref 0.40–1.50)
GFR: 108.13 mL/min (ref >60.00)
Glucose, Bld: 139 mg/dL — ABNORMAL HIGH (ref 70–99)
Potassium: 4.2 mEq/L (ref 3.5–5.1)
Sodium: 137 mEq/L (ref 135–145)
Total Bilirubin: 0.3 mg/dL (ref 0.2–1.2)
Total Protein: 8 g/dL (ref 6.0–8.3)

## 2019-08-31 LAB — HEMOGLOBIN A1C: Hgb A1c MFr Bld: 9.1 % — ABNORMAL HIGH (ref 4.6–6.5)

## 2019-08-31 MED ORDER — PREDNISONE 20 MG PO TABS: 20.0000 mg | ORAL_TABLET | Freq: Every day | ORAL | 0 refills | Status: DC
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
## 2019-08-31 NOTE — Patient Instructions (Signed)
Sciatica  Sciatica is pain, weakness, tingling, or loss of feeling (numbness) along the sciatic nerve. The sciatic nerve starts in the lower back and goes down the back of each leg. Sciatica usually goes away on its own or with treatment. Sometimes, sciatica may come back (recur). What are the causes? This condition happens when the sciatic nerve is pinched or has pressure put on it. This may be the result of:  A disk in between the bones of the spine bulging out too far (herniated disk).  Changes in the spinal disks that occur with aging.  A condition that affects a muscle in the butt.  Extra bone growth near the sciatic nerve.  A break (fracture) of the area between your hip bones (pelvis).  Pregnancy.  Tumor. This is rare. What increases the risk? You are more likely to develop this condition if you:  Play sports that put pressure or stress on the spine.  Have poor strength and ease of movement (flexibility).  Have had a back injury in the past.  Have had back surgery.  Sit for long periods of time.  Do activities that involve bending or lifting over and over again.  Are very overweight (obese). What are the signs or symptoms? Symptoms can vary from mild to very bad. They may include:  Any of these problems in the lower back, leg, hip, or butt: ? Mild tingling, loss of feeling, or dull aches. ? Burning sensations. ? Sharp pains.  Loss of feeling in the back of the calf or the sole of the foot.  Leg weakness.  Very bad back pain that makes it hard to move. These symptoms may get worse when you cough, sneeze, or laugh. They may also get worse when you sit or stand for long periods of time. How is this treated? This condition often gets better without any treatment. However, treatment may include:  Changing or cutting back on physical activity when you have pain.  Doing exercises and stretching.  Putting ice or heat on the affected area.  Medicines that  help: ? To relieve pain and swelling. ? To relax your muscles.  Shots (injections) of medicines that help to relieve pain, irritation, and swelling.  Surgery. Follow these instructions at home: Medicines  Take over-the-counter and prescription medicines only as told by your doctor.  Ask your doctor if the medicine prescribed to you: ? Requires you to avoid driving or using heavy machinery. ? Can cause trouble pooping (constipation). You may need to take these steps to prevent or treat trouble pooping:  Drink enough fluids to keep your pee (urine) pale yellow.  Take over-the-counter or prescription medicines.  Eat foods that are high in fiber. These include beans, whole grains, and fresh fruits and vegetables.  Limit foods that are high in fat and sugar. These include fried or sweet foods. Managing pain      If told, put ice on the affected area. ? Put ice in a plastic bag. ? Place a towel between your skin and the bag. ? Leave the ice on for 20 minutes, 2-3 times a day.  If told, put heat on the affected area. Use the heat source that your doctor tells you to use, such as a moist heat pack or a heating pad. ? Place a towel between your skin and the heat source. ? Leave the heat on for 20-30 minutes. ? Remove the heat if your skin turns bright red. This is very important if you are   unable to feel pain, heat, or cold. You may have a greater risk of getting burned. Activity   Return to your normal activities as told by your doctor. Ask your doctor what activities are safe for you.  Avoid activities that make your symptoms worse.  Take short rests during the day. ? When you rest for a long time, do some physical activity or stretching between periods of rest. ? Avoid sitting for a long time without moving. Get up and move around at least one time each hour.  Exercise and stretch regularly, as told by your doctor.  Do not lift anything that is heavier than 10 lb (4.5 kg)  while you have symptoms of sciatica. ? Avoid lifting heavy things even when you do not have symptoms. ? Avoid lifting heavy things over and over.  When you lift objects, always lift in a way that is safe for your body. To do this, you should: ? Bend your knees. ? Keep the object close to your body. ? Avoid twisting. General instructions  Stay at a healthy weight.  Wear comfortable shoes that support your feet. Avoid wearing high heels.  Avoid sleeping on a mattress that is too soft or too hard. You might have less pain if you sleep on a mattress that is firm enough to support your back.  Keep all follow-up visits as told by your doctor. This is important. Contact a doctor if:  You have pain that: ? Wakes you up when you are sleeping. ? Gets worse when you lie down. ? Is worse than the pain you have had in the past. ? Lasts longer than 4 weeks.  You lose weight without trying. Get help right away if:  You cannot control when you pee (urinate) or poop (have a bowel movement).  You have weakness in any of these areas and it gets worse: ? Lower back. ? The area between your hip bones. ? Butt. ? Legs.  You have redness or swelling of your back.  You have a burning feeling when you pee. Summary  Sciatica is pain, weakness, tingling, or loss of feeling (numbness) along the sciatic nerve.  This condition happens when the sciatic nerve is pinched or has pressure put on it.  Sciatica can cause pain, tingling, or loss of feeling (numbness) in the lower back, legs, hips, and butt.  Treatment often includes rest, exercise, medicines, and putting ice or heat on the affected area. This information is not intended to replace advice given to you by your health care provider. Make sure you discuss any questions you have with your health care provider. Document Revised: 04/10/2018 Document Reviewed: 04/10/2018 Elsevier Patient Education  2020 Elsevier Inc.  

## 2019-09-04 ENCOUNTER — Ambulatory Visit: Payer: 59 | Attending: Internal Medicine

## 2019-09-04 DIAGNOSIS — Z23 Encounter for immunization: Secondary | ICD-10-CM

## 2019-09-04 NOTE — Progress Notes (Signed)
   Covid-19 Vaccination Clinic  Name:  USMAN MILLETT    MRN: [id number] DOB: 10/03/1960  09/04/2019  Mr. Mozingo was observed post Covid-19 immunization for 15 minutes without incident. He was provided with Vaccine Information Sheet and instruction to access the V-Safe system.   Mr. Langille was instructed to call 911 with any severe reactions post vaccine: Marland Kitchen Difficulty breathing  . Swelling of face and throat  . A fast heartbeat  . A bad rash all over body  . Dizziness and weakness   Immunizations Administered    Name Date Dose VIS Date Route   Pfizer COVID-19 Vaccine 09/04/2019 11:09 AM 0.3 mL 05/30/2018 Intramuscular   Manufacturer: [employer], [employer]   Lot: [id number]   NDC: [address]-8

## 2019-09-05 ENCOUNTER — Telehealth: Payer: Self-pay

## 2019-09-05 NOTE — Telephone Encounter (Signed)
Paperwork received.

## 2019-09-05 NOTE — Telephone Encounter (Signed)
We have already explained to his job that we cannot talk to them over the phone. They were supposed to fax something regarding the work note and nothing has been received here.

## 2019-09-05 NOTE — Telephone Encounter (Signed)
New message    Seen 5.28.2021  The patient calling his job is asking for a call back reference to his visit.  Returning to work on tomorrow  6.3.2021   Person:  Duwayne Heck with [employer]  Phone # [phone] ext  [phone].

## 2019-09-06 ENCOUNTER — Telehealth: Payer: Self-pay | Admitting: Family

## 2019-09-06 NOTE — Telephone Encounter (Signed)
error 

## 2019-09-06 NOTE — Telephone Encounter (Signed)
Patient informed that paperwork was complete but that provider would need to sign off on paperwork before it would be sent back to [place].

## 2019-09-06 NOTE — Telephone Encounter (Signed)
Forms have been completed & Placed in providers box to review and sign.  Provider is out of office until 09/10/19.

## 2019-09-10 NOTE — Telephone Encounter (Signed)
    Patient calling to give reminder that he needs paperwork completed in order  to return to work.

## 2019-09-10 NOTE — Telephone Encounter (Signed)
Do you have this paperwork? There is nothing in my basket and I haven't seen anything.

## 2019-09-11 DIAGNOSIS — Z0279 Encounter for issue of other medical certificate: Secondary | ICD-10-CM

## 2019-09-11 NOTE — Telephone Encounter (Signed)
Forms have been faxed, Copy sent to [employer]Ch[employer]ed for.   VM box full for patient and original mailed to patient for his records.

## 2019-09-11 NOTE — Telephone Encounter (Signed)
Paperwork on Brittany's desk to be faxed as requested.

## 2019-09-11 NOTE — Telephone Encounter (Signed)
   Please re-fax paperwork to Danielle at [phone]

## 2019-09-11 NOTE — Telephone Encounter (Signed)
Forms have been re-faxed as requested

## 2019-09-12 ENCOUNTER — Ambulatory Visit: Payer: 59 | Admitting: Family Medicine

## 2019-09-12 NOTE — Progress Notes (Deleted)
   I, Ryan Rhodes, LAT, ATC, am serving as scribe for Dr. [name].  Ryan Rhodes is a 59 y.o. male who presents to [facility] today for f/u of R shoulder pain.  He was last seen by Dr. Denyse Rhodes on 08/01/19 and had a R subacromial injection.  He was also referred to outpatient PT but has not completed any visits.  Since his last visit, pt reports   Diagnostic imaging: R shoulder XR- 07/02/19  Pertinent review of systems: ***  Relevant historical information: ***   Exam:  There were no vitals taken for this visit. General: Well Developed, well nourished, and in no acute distress.   MSK: ***    Lab and Radiology Results No results found for this or any previous visit (from the past 72 hour(s)). No results found.     Assessment and Plan: 59 y.o. male with ***   PDMP not reviewed this encounter. No orders of the defined types were placed in this encounter.  No orders of the defined types were placed in this encounter.    Discussed warning signs or symptoms. Please see discharge instructions. Patient expresses understanding.   ***

## 2019-09-27 ENCOUNTER — Ambulatory Visit: Payer: 59

## 2019-09-29 ENCOUNTER — Ambulatory Visit: Payer: 59

## 2019-11-15 ENCOUNTER — Other Ambulatory Visit: Payer: Self-pay

## 2019-11-15 ENCOUNTER — Encounter: Payer: Self-pay | Admitting: Family Medicine

## 2019-11-15 ENCOUNTER — Ambulatory Visit (INDEPENDENT_AMBULATORY_CARE_PROVIDER_SITE_OTHER): Payer: 59 | Admitting: Family Medicine

## 2019-11-15 VITALS — BP 130/96 | HR 90 | Ht 65.0 in | Wt 196.2 lb

## 2019-11-15 DIAGNOSIS — S39012A Strain of muscle, fascia and tendon of lower back, initial encounter: Secondary | ICD-10-CM | POA: Diagnosis not present

## 2019-11-15 MED ORDER — CYCLOBENZAPRINE HCL 10 MG PO TABS: 10.0000 mg | ORAL_TABLET | Freq: Three times a day (TID) | ORAL | 0 refills | Status: DC | PRN

## 2019-11-15 NOTE — Progress Notes (Signed)
I, Ryan Rhodes, LAT, ATC, am serving as scribe for Dr. Clementeen Rhodes.  Ryan Rhodes is a 59 y.o. male who presents to [facility] at [facility] today for low back pain.  He was last seen by Dr. [name] on 08/01/19 for R shoulder pain and had a R subacromial injection.  He then saw his PCP on 08/31/19 for low back pain and was prescribed a 5 day course of prednisone and had an L-spine XR.  Since then, pt reports that his low back pain has been worsening.  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  He states that IBU are no longer helping.  He has been taken out of work due to pain.  He is having radiating pain into his B buttocks.  He will have occasional numbness/tingling into his B LEs.  His back hurt worse at work last week and he was sent to an occupational medicine clinic as part of [facility] on August 4 where x-ray was done and was not significantly changed from prior lumbar spine x-ray.  He was pulled out of work and has a follow-up appointment scheduled next week.  [facility] - [facility]  [address]  [place], [place] [address]  [phone]     Diagnostic testing: L-spine XR- 11/13/19; 08/31/19   Pertinent review of systems: No fevers or chills  Relevant historical information: Diabetes   Exam:  BP (!) 130/96 (BP Location: Right Arm, Patient Position: Sitting, Cuff Size: Normal)   Pulse 90   Ht 5\' 5"  (1.651 m)   Wt 196 lb 3.2 oz (89 kg)   SpO2 97%   BMI 32.65 kg/m  General: Well Developed, well nourished, and in no acute distress.   MSK: L-spine normal. Mild tender palpation right lumbar paraspinal musculature nontender midline. Normal lumbar motion. Lower extremity strength reflexes and sensation are equal normal throughout bilateral extremities. Negative Spurling's test.    Lab and Radiology Results EXAM: LUMBAR  SPINE - COMPLETE 4+ VIEW  COMPARISON:  None.  FINDINGS: The alignment is maintained. Vertebral body heights are normal. There is no listhesis. The posterior elements are intact. No fracture. Endplate spurring with mild disc space narrowing at L4-L5 and L5-S1. L5-S1 facet hypertrophy. No evidence of focal bone lesion. Sacroiliac joints are partially included and normal where visualized.  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 Mild L5-S1 facet hypertrophy.   Electronically Signed   By: M.D.   On: 08/31/2019 21:36  I, 09/02/2019, personally (independently) visualized and performed the interpretation of the images attached in this note.     Assessment and Plan: 59 y.o. male with right low back pain.  Acute exacerbation of somewhat chronic low back pain for the last few months.  Patient does have some degenerative changes on x-ray but I think the majority of his pain is due to lumbosacral strain and spasm.  At this point most likely thing to benefit is physical therapy.  Recommend trial of physical therapy.  We will also communicate with occupational health clinic would recommend they order physical therapy as well as and will be better for him to do it that way.  Happy to take over care if occupational health would like to place referral.  If not improving with physical therapy neck step should be lumbar spine MRI for  injection planning.  Recommend heating pad and TENS unit.  Will prescribe Flexeril as Robaxin has not been helpful.  [facility] occupational clinic.  Orders Placed This Encounter  Procedures  . Ambulatory referral to Physical Therapy    Referral Priority:   Routine    Referral Type:   Physical Medicine    Referral Reason:   Specialty Services Required    Requested Specialty:   Physical Therapy   Meds ordered this encounter  Medications  . cyclobenzaprine (FLEXERIL) 10 MG tablet    Sig: Take 1 tablet (10 mg total) by  mouth 3 (three) times daily as needed for muscle spasms.    Dispense:  30 tablet    Refill:  0     Discussed warning signs or symptoms. Please see discharge instructions. Patient expresses understanding.   The above documentation has been reviewed and is accurate and complete Ryan Rhodes, M.D.

## 2019-11-15 NOTE — Patient Instructions (Addendum)
Thank you for coming in today. Try flexeril at bedtime.  Use heat and TENS unit.  Plan for PT.  I will also ask the [facility] Occupational doctor to order PT.   If not improved let me know.  Next step in my opinion after PT is MRI for injection planning.   TENS UNIT: This is helpful for muscle pain and spasm.   Search and Purchase a TENS 7000 2nd edition at  www.tenspros.com or www.Amazon.com It should be less than $30.     TENS unit instructions: Do not shower or bathe with the unit on Turn the unit off before removing electrodes or batteries If the electrodes lose stickiness add a drop of water to the electrodes after they are disconnected from the unit and place on plastic sheet. If you continued to have difficulty, call the TENS unit company to purchase more electrodes. Do not apply lotion on the skin area prior to use. Make sure the skin is clean and dry as this will help prolong the life of the electrodes. After use, always check skin for unusual red areas, rash or other skin difficulties. If there are any skin problems, does not apply electrodes to the same area. Never remove the electrodes from the unit by pulling the wires. Do not use the TENS unit or electrodes other than as directed. Do not change electrode placement without consultating your therapist or physician. Keep 2 fingers with between each electrode. Wear time ratio is 2:1, on to off times.    For example on for 30 minutes off for 15 minutes and then on for 30 minutes off for 15 minutes    Lumbosacral Strain Lumbosacral strain is an injury that causes pain in the lower back (lumbosacral spine). This injury usually happens from overstretching the muscles or ligaments along your spine. Ligaments are cord-like tissues that connect bones to other bones. A strain can affect one or more muscles or ligaments. What are the causes? This condition may be caused by:  A hard, direct hit to the back.  Overstretching the  lower back muscles. This may result from: ? A fall. ? Lifting something heavy. ? Repetitive movements such as bending or crouching. What increases the risk? The following factors may make you more likely to develop this condition:  Participating in sports or activities that involve: ? A sudden twist of the back. ? Pushing or pulling motions.  Being overweight or obese.  Having poor strength and flexibility, especially tight hamstrings or weak muscles in the back or abdomen.  Having too much of a curve in the lower back.  Having a pelvis that is tilted forward. What are the signs or symptoms? The main symptom of this condition is pain in the lower back, at the site of the strain. Pain may also be felt down one or both legs. How is this diagnosed? This condition is diagnosed based on your symptoms, your medical history, and a physical exam. During the physical exam, your health care provider may push on certain areas of your back to find the source of your pain. You may be asked to bend forward, backward, and side to side to check your pain and range of motion. You may also have imaging tests, such as X-rays and an MRI. How is this treated? This condition may be treated by:  Applying heat and cold on the affected area.  Taking medicines to help relieve pain and relax your muscles.  Taking NSAIDs, such as ibuprofen, to help  reduce swelling and discomfort.  Doing stretching and strengthening exercises for your lower back. Symptoms usually improve within several weeks of treatment. However, recovery time varies. When your symptoms improve, gradually return to your normal routine as soon as possible to reduce pain, avoid stiffness, and keep muscle strength. Follow these instructions at home: Medicines  Take over-the-counter and prescription medicines only as told by your health care provider.  Ask your health care provider if the medicine prescribed to you: ? Requires you to avoid  driving or using heavy machinery. ? Can cause constipation. You may need to take these actions to prevent or treat constipation:  Drink enough fluid to keep your urine pale yellow.  Take over-the-counter or prescription medicines.  Eat foods that are high in fiber, such as beans, whole grains, and fresh fruits and vegetables.  Limit foods that are high in fat and processed sugars, such as fried or sweet foods. Managing pain, stiffness, and swelling      If directed, put ice on the injured area. To do this: ? Put ice in a plastic bag. ? Place a towel between your skin and the bag. ? Leave the ice on for 20 minutes, 2-3 times a day.  If directed, apply heat on the affected area as often as told by your health care provider. Use the heat source that your health care provider recommends, such as a moist heat pack or a heating pad. ? Place a towel between your skin and the heat source. ? Leave the heat on for 20-30 minutes. ? Remove the heat if your skin turns bright red. This is especially important if you are unable to feel pain, heat, or cold. You may have a greater risk of getting burned. Activity  Rest as told by your health care provider.  Do not stay in bed. Staying in bed for more than 1-2 days can delay your recovery.  Return to your normal activities as told by your health care provider. Ask your health care provider what activities are safe for you.  Avoid activities that take a lot of energy for as long as told by your health care provider.  Do exercises as told by your health care provider. This includes stretching and strengthening exercises. General instructions  Sit up and stand up straight. Avoid leaning forward when you sit, or hunching over when you stand.  Do not use any products that contain nicotine or tobacco, such as cigarettes, e-cigarettes, and chewing tobacco. If you need help quitting, ask your health care provider.  Keep all follow-up visits as told by  your health care provider. This is important. How is this prevented?   Use correct form when playing sports and lifting heavy objects.  Use good posture when sitting and standing.  Maintain a healthy weight.  Sleep on a mattress with medium firmness to support your back.  Do at least 150 minutes of moderate-intensity exercise each week, such as brisk walking or water aerobics. Try a form of exercise that takes stress off your back, such as swimming or stationary cycling.  Maintain physical fitness, including: ? Strength. ? Flexibility. Contact a health care provider if:  Your back pain does not improve after several weeks of treatment.  Your symptoms get worse. Get help right away if:  Your back pain is severe.  You cannot stand or walk.  You have difficulty controlling when you urinate or when you have a bowel movement.  You feel nauseous or you vomit.  Your  feet or legs get very cold, turn pale, or look blue.  You have numbness, tingling, weakness, or problems using your arms or legs.  You develop any of the following: ? Shortness of breath. ? Dizziness. ? Pain in your legs. ? Weakness in your buttocks or legs. Summary  Lumbosacral strain is an injury that causes pain in the lower back (lumbosacral spine).  This injury usually happens from overstretching the muscles or ligaments along your spine.  This condition may be caused by a direct hit to the lower back or by overstretching the lower back muscles.  Symptoms usually improve within several weeks of treatment. This information is not intended to replace advice given to you by your health care provider. Make sure you discuss any questions you have with your health care provider. Document Revised: 08/15/2018 Document Reviewed: 08/15/2018 Elsevier Patient Education  2020 [employer].

## 2019-11-16 ENCOUNTER — Ambulatory Visit: Payer: 59 | Admitting: Family

## 2020-03-07 ENCOUNTER — Ambulatory Visit: Payer: 59

## 2020-03-13 ENCOUNTER — Ambulatory Visit (HOSPITAL_COMMUNITY)
Admission: EM | Admit: 2020-03-13 | Discharge: 2020-03-13 | Disposition: A | Discharge status: Home / Self Care | Payer: 59

## 2020-03-13 ENCOUNTER — Other Ambulatory Visit: Payer: Self-pay

## 2020-03-17 ENCOUNTER — Ambulatory Visit: Payer: 59 | Admitting: Family

## 2020-03-17 ENCOUNTER — Other Ambulatory Visit: Payer: Self-pay

## 2020-03-17 ENCOUNTER — Encounter: Payer: Self-pay | Admitting: Family

## 2020-03-17 ENCOUNTER — Ambulatory Visit (INDEPENDENT_AMBULATORY_CARE_PROVIDER_SITE_OTHER): Payer: Self-pay

## 2020-03-17 VITALS — BP 130/98 | HR 97 | Temp 98.4°F | Ht 65.0 in | Wt 186.0 lb

## 2020-03-17 DIAGNOSIS — I10 Essential (primary) hypertension: Secondary | ICD-10-CM

## 2020-03-17 DIAGNOSIS — M25512 Pain in left shoulder: Secondary | ICD-10-CM

## 2020-03-17 DIAGNOSIS — E119 Type 2 diabetes mellitus without complications: Secondary | ICD-10-CM

## 2020-03-17 LAB — CBC WITH DIFFERENTIAL/PLATELET
Basophils Absolute: 0 10*3/uL (ref 0.0–0.1)
Basophils Relative: 0.9 % (ref 0.0–3.0)
Eosinophils Absolute: 0 10*3/uL (ref 0.0–0.7)
Eosinophils Relative: 1.1 % (ref 0.0–5.0)
HCT: 49.9 % (ref 39.0–52.0)
Hemoglobin: 16.2 g/dL (ref 13.0–17.0)
Lymphocytes Relative: 36.2 % (ref 12.0–46.0)
Lymphs Abs: 1.6 10*3/uL (ref 0.7–4.0)
MCHC: 32.5 g/dL (ref 30.0–36.0)
MCV: 82.6 fl (ref 78.0–100.0)
Monocytes Absolute: 0.8 10*3/uL (ref 0.1–1.0)
Monocytes Relative: 17.6 % — ABNORMAL HIGH (ref 3.0–12.0)
Neutro Abs: 1.9 10*3/uL (ref 1.4–7.7)
Neutrophils Relative %: 44.2 % (ref 43.0–77.0)
Platelets: 358 10*3/uL (ref 150.0–400.0)
RBC: 6.05 Mil/uL — ABNORMAL HIGH (ref 4.22–5.81)
RDW: 14.7 % (ref 11.5–15.5)
WBC: 4.4 10*3/uL (ref 4.0–10.5)

## 2020-03-17 LAB — COMPREHENSIVE METABOLIC PANEL
ALT: 35 U/L (ref 0–53)
AST: 28 U/L (ref 0–37)
Albumin: 4.9 g/dL (ref 3.5–5.2)
Alkaline Phosphatase: 59 U/L (ref 39–117)
BUN: 14 mg/dL (ref 6–23)
CO2: 29 mEq/L (ref 19–32)
Calcium: 10 mg/dL (ref 8.4–10.5)
Chloride: 98 mEq/L (ref 96–112)
Creatinine, Ser: 1.01 mg/dL (ref 0.40–1.50)
GFR: 82.71 mL/min (ref >60.00)
Glucose, Bld: 195 mg/dL — ABNORMAL HIGH (ref 70–99)
Potassium: 4.6 mEq/L (ref 3.5–5.1)
Sodium: 135 mEq/L (ref 135–145)
Total Bilirubin: 0.6 mg/dL (ref 0.2–1.2)
Total Protein: 8.7 g/dL — ABNORMAL HIGH (ref 6.0–8.3)

## 2020-03-17 LAB — HEMOGLOBIN A1C: Hgb A1c MFr Bld: 7.9 % — ABNORMAL HIGH (ref 4.6–6.5)

## 2020-03-17 MED ORDER — PREDNISONE 20 MG PO TABS
20.0000 mg | ORAL_TABLET | Freq: Every day | ORAL | 0 refills | Status: DC
Start: 2020-03-17 — End: 2020-06-23
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
## 2020-03-22 ENCOUNTER — Ambulatory Visit
Admission: EM | Admit: 2020-03-22 | Discharge: 2020-03-22 | Disposition: A | Discharge status: Home / Self Care | Payer: Self-pay | Attending: Urgent Care | Admitting: Urgent Care

## 2020-03-22 ENCOUNTER — Other Ambulatory Visit: Payer: Self-pay

## 2020-03-22 DIAGNOSIS — M79622 Pain in left upper arm: Secondary | ICD-10-CM

## 2020-03-22 DIAGNOSIS — S46209A Unspecified injury of muscle, fascia and tendon of other parts of biceps, unspecified arm, initial encounter: Secondary | ICD-10-CM

## 2020-03-22 MED ORDER — TIZANIDINE HCL 4 MG PO TABS: 4.0000 mg | ORAL_TABLET | Freq: Four times a day (QID) | ORAL | 0 refills | Status: DC | PRN

## 2020-03-22 MED ORDER — NAPROXEN 375 MG PO TABS
375.0000 mg | ORAL_TABLET | Freq: Two times a day (BID) | ORAL | 0 refills | Status: DC
Start: 2020-03-22 — End: 2020-09-12
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
## 2020-03-22 NOTE — Discharge Instructions (Signed)
Please make sure you keep your appointment with your orthopedist to see about an MRI, ultrasound to evaluate for biceps tendon tear, rupture. In the meantime, use naproxen for pain and inflammation, tizanidine as a muscle relaxant.

## 2020-03-24 NOTE — Progress Notes (Signed)
I, Ryan Rhodes, LAT, ATC, am serving as scribe for Dr. [name].  Ryan Rhodes is a 59 y.o. male who presents to [facility] at [facility] today for L shoulder / upper arm pain.  He was last seen by Dr. [name] on 11/15/19 for his low back.  Since then, his L upper arm/biceps has been bothering him since .  He locates his pain to [facility] joint area.  He has been seen by primary care and at the [facility] for these c/o. Pt reports pain in L shoulder and biceps. Pt reports shoulder pain began about 3 weeks ago. Pt notes that he was at work and tightly gripped the steering wheel when he felt the truck rolling backwards and felt extreme pain in biceps, on Wed 12/15. Pt states he can feel and see biceps move around w/ contraction.  Swelling: slight Aggravating factors: shoulder ext Treatments tried: short course prednisone; Naproxen; Tizanidine; IBU  Diagnostic imaging: L shoulder XR- 03/17/20   Pertinent review of systems: No fevers or chills  Relevant historical information: Hypertension, diabetes.  History right shoulder distal biceps tenodesis and rotator cuff surgery   Exam:  BP (!) 140/98 (BP Location: Right Arm, Patient Position: Sitting, Cuff Size: Normal)   Pulse 91   Ht 5\' 5"  (1.651 m)   Wt 191 lb 3.2 oz (86.7 kg)   SpO2 99%   BMI 31.82 kg/m  General: Well Developed, well nourished, and in no acute distress.   MSK: Left shoulder normal-appearing shoulder.  Biceps tendon is a bit prominent and slightly displaced distally. Normal shoulder motion pain with abduction and internal rotation. Strength intact abduction external and internal rotation. Positive Hawkins and Neer's test.  Positive indican test.  Positive Yergason's and speeds test.    Lab and Radiology Results DG Shoulder Left  Result Date: 03/17/2020 CLINICAL DATA:  59 year old male with left shoulder pain EXAM: LEFT SHOULDER - 2+ VIEW COMPARISON:  None. FINDINGS: There is no evidence of fracture  or dislocation. There is no evidence of arthropathy or other focal bone abnormality. Soft tissues are unremarkable. IMPRESSION: Negative. Electronically Signed   By: 60 D.O.   On: 03/17/2020 16:29   I, 03/19/2020, personally (independently) visualized and performed the interpretation of the images attached in this note.  Procedure: Real-time Ultrasound Guided Injection of left shoulder subacromial bursa Device: Philips Affiniti 50G Images permanently stored and available for review in PACS Ultrasound examination of shoulder prior to injection reveals intact biceps tendon proximally however diminished appearance of biceps tendon more distal to the bicipital groove.  Hypoechoic fluid surrounds biceps tendon proximally. Intact subscapularis supraspinatus and infraspinatus tendons. Increased thickness subacromial bursa. Verbal informed consent obtained.  Discussed risks and benefits of procedure. Warned about infection bleeding damage to structures skin hypopigmentation and fat atrophy among others. Patient expresses understanding and agreement Time-out conducted.   Noted no overlying erythema, induration, or other signs of local infection.   Skin prepped in a sterile fashion.   Local anesthesia: Topical Ethyl chloride.   With sterile technique and under real time ultrasound guidance:  40 mg of Kenalog and 2 mL of Marcaine injected into subacromial bursa. Fluid seen entering the bursa.   Completed without difficulty   Pain immediately resolved suggesting accurate placement of the medication.   Advised to call if fevers/chills, erythema, induration, drainage, or persistent bleeding.   Images permanently stored and available for review in the ultrasound unit.  Impression: Technically successful ultrasound guided  injection.     Assessment and Plan: 59 y.o. male with left shoulder pain and biceps pain.  I think Beacher suffered a either partial or complete long head proximal biceps  tendon tear in the setting of subacromial bursitis and probable rotator cuff tendinopathy.  Plan for physical therapy subacromial injection and home exercise program.  Check back in in 8 weeks or sooner if needed.   PDMP not reviewed this encounter. Orders Placed This Encounter  Procedures  . [place] LIMITED JOINT SPACE STRUCTURES UP LEFT(NO LINKED CHARGES)    Standing Status:   Future    Number of Occurrences:   1    Standing Expiration Date:   09/23/2020    Order Specific Question:   Reason for Exam (SYMPTOM  OR DIAGNOSIS REQUIRED)    Answer:   chronic left arm pain    Order Specific Question:   Preferred imaging location?    Answer:   Adult nurse [facility]  . DG Shoulder Left    Standing Status:   Future    Number of Occurrences:   1    Standing Expiration Date:   03/25/2021    Order Specific Question:   Reason for Exam (SYMPTOM  OR DIAGNOSIS REQUIRED)    Answer:   Left shoulder pain    Order Specific Question:   Preferred imaging location?    Answer:   [facility]  . Ambulatory referral to Physical Therapy    Referral Priority:   Routine    Referral Type:   Physical Medicine    Referral Reason:   Specialty Services Required    Requested Specialty:   Physical Therapy   No orders of the defined types were placed in this encounter.    Discussed warning signs or symptoms. Please see discharge instructions. Patient expresses understanding.   The above documentation has been reviewed and is accurate and complete [name], M.D.

## 2020-03-25 ENCOUNTER — Other Ambulatory Visit: Payer: Self-pay

## 2020-03-25 ENCOUNTER — Ambulatory Visit: Payer: Self-pay

## 2020-03-25 ENCOUNTER — Ambulatory Visit (INDEPENDENT_AMBULATORY_CARE_PROVIDER_SITE_OTHER): Payer: Self-pay | Admitting: Family Medicine

## 2020-03-25 ENCOUNTER — Ambulatory Visit (INDEPENDENT_AMBULATORY_CARE_PROVIDER_SITE_OTHER): Payer: Self-pay

## 2020-03-25 VITALS — BP 140/98 | HR 91 | Ht 65.0 in | Wt 191.2 lb

## 2020-03-25 DIAGNOSIS — M25512 Pain in left shoulder: Secondary | ICD-10-CM

## 2020-03-25 DIAGNOSIS — G8929 Other chronic pain: Secondary | ICD-10-CM

## 2020-03-25 NOTE — Patient Instructions (Addendum)
Thank you for coming in today.  I've referred you to Physical Therapy.  Let us know if you don't hear from them in one week.  Call or go to the ER if you develop a large red swollen joint with extreme pain or oozing puss.    Check back 8 weeks

## 2020-03-26 NOTE — Progress Notes (Signed)
X-ray left shoulder looks normal to radiology

## 2020-04-11 ENCOUNTER — Ambulatory Visit: Payer: Self-pay | Attending: Internal Medicine

## 2020-04-11 DIAGNOSIS — Z23 Encounter for immunization: Secondary | ICD-10-CM

## 2020-04-11 NOTE — Progress Notes (Signed)
   Covid-19 Vaccination Clinic  Name:  Ryan Rhodes    MRN: [id number] DOB: 05/09/1960  04/11/2020  [name] was observed post Covid-19 immunization for 15 minutes without incident. He was provided with Vaccine Information Sheet and instruction to access the V-Safe system.   [name] was instructed to call 911 with any severe reactions post vaccine: Marland Kitchen Difficulty breathing  . Swelling of face and throat  . A fast heartbeat  . A bad rash all over body  . Dizziness and weakness   Immunizations Administered    Name Date Dose VIS Date Route   Pfizer COVID-19 Vaccine 04/11/2020  3:15 PM 0.3 mL 01/23/2020 Intramuscular   Manufacturer: [employer], [employer]   Lot: [id number]   NDC: [address]-0

## 2020-05-19 NOTE — Progress Notes (Deleted)
   I, [name], LAT, ATC, am serving as scribe for Dr. [name].  Ryan Rhodes is a 60 y.o. male who presents to [facility] at [facility] today for f/u of L shoulder/upper arm pain.  He was last seen by Dr. [name] for these c/o on 03/25/20 and had a L subacromial injection.  He was referred to PT at the [facility] location but did not complete any visits.  Since his last visit w/ Dr. [name], pt reports   Diagnostic testing: L shoulder XR- 03/25/20, 03/17/20  Pertinent review of systems: ***  Relevant historical information: ***   Exam:  There were no vitals taken for this visit. General: Well Developed, well nourished, and in no acute distress.   MSK: ***    Lab and Radiology Results No results found for this or any previous visit (from the past 72 hour(s)). No results found.     Assessment and Plan: 60 y.o. male with ***   PDMP not reviewed this encounter. No orders of the defined types were placed in this encounter.  No orders of the defined types were placed in this encounter.    Discussed warning signs or symptoms. Please see discharge instructions. Patient expresses understanding.   ***

## 2020-05-20 ENCOUNTER — Ambulatory Visit: Payer: Self-pay | Admitting: Family Medicine

## 2020-05-22 ENCOUNTER — Other Ambulatory Visit: Payer: Self-pay | Admitting: Family

## 2020-05-22 DIAGNOSIS — I10 Essential (primary) hypertension: Secondary | ICD-10-CM

## 2020-05-23 NOTE — Progress Notes (Signed)
I, Ryan Rhodes, LAT, ATC, am serving as scribe for Dr. [name].  Ryan Rhodes is a 60 y.o. male who presents to [facility] at [facility] today for f/u of L shoulder/upper arm/biceps pain.  He was last seen by Dr. [name] on 03/25/20 and had a L subacromial injection.  He was referred to PT at [facility] location but did not complete any visits due to his hectic work schedule.  Since his last visit, pt reports relief from injection, but pain returning end of last week. Pt c/o difficulty sleeping because of pain. Of note, pt works as a Naval architect. Pt locates pain to anterior and superior aspect aspect of L GH joint. Increased pain w/ shoulder extension.  Ryan Rhodes notes that his bicep is bothersome and painful and annoying.  He would like to proceed with surgery if possible.  He had similar shoulder surgery on the right side a few years ago.  Pt also c/o L biceps pain. Pt notes a "knot" in proximal aspect of L bicpes.  Diagnostic imaging: L shoulder XR- 03/25/20, 03/17/20  Pertinent review of systems: No fevers or chills  Relevant historical information: Hypertension, diabetes, works as a Naval architect   Exam:  BP (!) 093/818 (BP Location: Right Arm, Patient Position: Sitting, Cuff Size: Normal)   Pulse 93   Ht 5\' 5"  (1.651 m)   Wt 190 lb (86.2 kg)   SpO2 96%   BMI 31.62 kg/m  General: Well Developed, well nourished, and in no acute distress.   MSK: Left shoulder Popeye arm deformity.  Otherwise normal. Normal shoulder motion. Some pain with abduction. Intact strength. Mildly positive Hawkins and Neer's test.  Negative Yergason's and speeds test. Positive empty can test.    Lab and Radiology Results  Diagnostic Limited MSK Ultrasound of: Left shoulder Biceps tendon.  Absent distally but present proximally indicating full-thickness tear. Subscapularis tendon is intact. Supraspinatus tendon thin but intact appearing with moderate thickness subacromial  bursa. Infraspinatus tendon is intact. AC joint degenerative with effusion. Impression: Biceps tendon tear.  Subacromial bursitis     Assessment and Plan: 60 y.o. male with left shoulder pain persistent following subacromial injection.  Patient also has evidence of full-thickness proximal biceps tendon tear.  He would like to proceed with surgery for his proximal biceps tendon tear.  Additionally he has symptoms consistent with subacromial bursitis.  We will go ahead and proceed with MRI to evaluate shoulder etiology and prepare for biceps surgery.  Following MRI likely will proceed to orthopedic surgery for surgical consultation.   PDMP not reviewed this encounter. Orders Placed This Encounter  Procedures  . 60 LIMITED JOINT SPACE STRUCTURES UP LEFT(NO LINKED CHARGES)    Standing Status:   Future    Number of Occurrences:   1    Standing Expiration Date:   11/23/2020    Order Specific Question:   Reason for Exam (SYMPTOM  OR DIAGNOSIS REQUIRED)    Answer:   left shoulder pain    Order Specific Question:   Preferred imaging location?    Answer:   11/25/2020 [facility]  . MR SHOULDER LEFT WO CONTRAST    Standing Status:   Future    Standing Expiration Date:   05/26/2021    Order Specific Question:   What is the patient's sedation requirement?    Answer:   No Sedation    Order Specific Question:   Does the patient have a pacemaker or implanted devices?  Answer:   No    Order Specific Question:   Preferred imaging location?    Answer:   [facility] (table limit-350lbs)   No orders of the defined types were placed in this encounter.    Discussed warning signs or symptoms. Please see discharge instructions. Patient expresses understanding.   The above documentation has been reviewed and is accurate and complete [name], M.D.

## 2020-05-26 ENCOUNTER — Other Ambulatory Visit: Payer: Self-pay

## 2020-05-26 ENCOUNTER — Ambulatory Visit: Payer: Self-pay

## 2020-05-26 ENCOUNTER — Ambulatory Visit (INDEPENDENT_AMBULATORY_CARE_PROVIDER_SITE_OTHER): Payer: 59 | Admitting: Family Medicine

## 2020-05-26 VITALS — BP 140/100 | HR 93 | Ht 65.0 in | Wt 190.0 lb

## 2020-05-26 DIAGNOSIS — G8929 Other chronic pain: Secondary | ICD-10-CM

## 2020-05-26 DIAGNOSIS — M66322 Spontaneous rupture of flexor tendons, left upper arm: Secondary | ICD-10-CM | POA: Diagnosis not present

## 2020-05-26 DIAGNOSIS — M25512 Pain in left shoulder: Secondary | ICD-10-CM | POA: Diagnosis not present

## 2020-05-26 NOTE — Patient Instructions (Addendum)
Thank you for coming in today.  You should hear from MRI scheduling within 1 week. If you do not hear please let me know.   I will likely refer directly to orthopedic surgery following MRI.

## 2020-06-07 ENCOUNTER — Other Ambulatory Visit: Payer: Self-pay | Admitting: Internal Medicine

## 2020-06-07 DIAGNOSIS — M109 Gout, unspecified: Secondary | ICD-10-CM

## 2020-06-08 ENCOUNTER — Ambulatory Visit (INDEPENDENT_AMBULATORY_CARE_PROVIDER_SITE_OTHER): Payer: 59

## 2020-06-08 ENCOUNTER — Other Ambulatory Visit: Payer: Self-pay

## 2020-06-08 DIAGNOSIS — G8929 Other chronic pain: Secondary | ICD-10-CM | POA: Diagnosis not present

## 2020-06-08 DIAGNOSIS — M25512 Pain in left shoulder: Secondary | ICD-10-CM

## 2020-06-08 DIAGNOSIS — M66322 Spontaneous rupture of flexor tendons, left upper arm: Secondary | ICD-10-CM

## 2020-06-10 ENCOUNTER — Telehealth: Payer: Self-pay | Admitting: Physical Therapy

## 2020-06-10 NOTE — Telephone Encounter (Signed)
Called pt's cell regarding L shoulder MRI results but unable to LM due to VM being full.  If pt returns call, please schedule him for a f/u appt w/ Dr. [name] to go over these results.  FYI- will likely need to be referred to ortho based on MRI results but this will be discussed w/ pt when he follows-up w/ Dr. [name].

## 2020-06-10 NOTE — Telephone Encounter (Signed)
Appointment scheduled with Dr [name] on Wednesday, 06/18/2020.

## 2020-06-12 NOTE — Progress Notes (Signed)
MRI shoulder shows medium rotator cuff tendinitis with tear of one of the rotator cuff tendon.  Additionally the biceps tendon looks to be torn.  Looks like you also have a tearing of the labrum.  You have some arthritis in the shoulder as well and a ganglion cyst in the shoulder.  Schedule with me next week to go over the results in full detail.  This will likely require surgery.

## 2020-06-17 NOTE — Progress Notes (Deleted)
   I, [name], LAT, ATC, am serving as scribe for Dr. [name].  Ryan Rhodes is a 60 y.o. male who presents to [facility] at [facility] today for f/u of L shoulder pain and L shoulder MRI review.  He was last seen by Dr. [name] on 05/26/20 and noted con't L ant-sup shoulder pain and biceps pain thought to be a likely proximal biceps tendon tear.  He was referred for a L shoulder MRI to assist w/ treatment planning to possibly include referral to orthopedics.  Since his last visit, pt reports     Diagnostic imaging: L shoulder MRI- 06/08/20; L shoulder XR- 03/25/20, 03/17/20  Pertinent review of systems: ***  Relevant historical information: ***   Exam:  There were no vitals taken for this visit. General: Well Developed, well nourished, and in no acute distress.   MSK: ***    Lab and Radiology Results No results found for this or any previous visit (from the past 72 hour(s)). No results found.     Assessment and Plan: 60 y.o. male with ***   PDMP not reviewed this encounter. No orders of the defined types were placed in this encounter.  No orders of the defined types were placed in this encounter.    Discussed warning signs or symptoms. Please see discharge instructions. Patient expresses understanding.   ***

## 2020-06-18 ENCOUNTER — Ambulatory Visit: Payer: 59 | Admitting: Family Medicine

## 2020-06-20 NOTE — Progress Notes (Signed)
I, [name], LAT, ATC, am serving as scribe for Dr. [name].  Ryan Rhodes is a 60 y.o. male who presents to [facility] at [facility] today for f/u chronic L shoulder/upper arm/biceps pain due to full-thickness proximal biceps tendon tear and MRI review. Pt was last seen by Dr. [name] on 05/26/20 and was advised to obtain a MRI to further characterize etiology and prepare for surgery. He had a prior L subacromial injection on 03/25/20 w/ limited relief noted.  Today, pt reports that his L shoulder and upper arm pain have progressively worsened since RTW 3 weeks ago.  He states that he cannot lay on his L side at night.  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  Dx imaging: 06/08/20 L shoulder MRI   03/25/20 L shoulder XR  03/17/20 L shoulder XR  Pertinent review of systems: No fevers or chills  Relevant historical information: History of right biceps tendon repair   Exam:  BP 138/90 (BP Location: Right Arm, Patient Position: Sitting, Cuff Size: Normal)   Pulse (!) 106   Ht 5\' 5"  (1.651 m)   Wt 187 lb 3.2 oz (84.9 kg)   SpO2 96%   BMI 31.15 kg/m  General: Well Developed, well nourished, and in no acute distress.   MSK: Left shoulder Popeye arm deformity.  Normal shoulder motion pain with abduction.    Lab and Radiology Results  EXAM: MRI OF THE LEFT SHOULDER WITHOUT CONTRAST  TECHNIQUE: Multiplanar, multisequence MR imaging of the shoulder was performed. No intravenous contrast was administered.  COMPARISON:  X-ray 03/25/2020  FINDINGS: Rotator cuff: Moderate subscapularis tendinosis with high-grade partial-thickness insertional tears of the distal tendon (series 9, images 10-11). Moderate supraspinatus tendinosis with tiny interstitial tear anteriorly (series 6, image 10). Moderate infraspinatus tendinosis without tear. Intact teres minor. No full-thickness rotator cuff tear.  Muscles: Preserved  bulk and signal intensity of the rotator cuff musculature without edema, atrophy, or fatty infiltration.  Biceps long head: Nonvisualization of the long head biceps tendon, likely torn and retracted.  Acromioclavicular Joint: No significant arthropathy of the AC joint. Trace subacromial-subdeltoid bursal fluid.  Glenohumeral Joint: Mild partial-thickness chondral surface irregularity of the superior glenoid.  Labrum: Marked intrasubstance degeneration with extensive complex tearing of the superior labrum. Posterior labrum is blunted.  Bones: 15 x 9 x 11 mm subchondral cystic structure at the superior glenoid adjacent to area of marked labral degeneration, likely reflecting an intraosseous ganglion. No acute fracture. No dislocation. No suspicious bony lesion.  Other: None.  IMPRESSION: 1. Moderate rotator cuff tendinosis with high-grade partial-thickness insertional tears of the distal subscapularis tendon. 2. Nonvisualization of the long head biceps tendon, likely torn and retracted. 3. Marked degeneration with extensive complex tearing of the superior labrum. 4. Mild glenohumeral joint osteoarthritis. 5. Probable 15 mm intraosseous ganglion within the superior glenoid.   Electronically Signed   By: 03/27/2020 D.O.   On: 06/09/2020 09:11  I, 08/09/2020, personally (independently) visualized and performed the interpretation of the images attached in this note.     Assessment and Plan: 60 y.o. male with left shoulder pain and dysfunction.  Several issues.  Proximal biceps tendon tear.  Patient desirous of repair.  Referral to surgery for this issue.  Additionally patient has labrum tear and ganglion cyst superior labrum that would likely benefit from surgical debridement.  This also would be a reason for surgery and referral placed today.  Additionally patient has subscapularis rotator cuff tendinopathy with interstitial rotator cuff tears.  This may  be surgical but more likely will be treated with physical therapy following surgery for the above issues.  Surgery referral placed today.  Discussed MRI findings and plan with patient expresses understanding and agreement.   PDMP not reviewed this encounter. Orders Placed This Encounter  Procedures  . Ambulatory referral to Orthopedic Surgery    Referral Priority:   Routine    Referral Type:   Surgical    Referral Reason:   Specialty Services Required    Requested Specialty:   Orthopedic Surgery    Number of Visits Requested:   1   No orders of the defined types were placed in this encounter.    Discussed warning signs or symptoms. Please see discharge instructions. Patient expresses understanding.   The above documentation has been reviewed and is accurate and complete [name], M.D.  Total encounter time 20 minutes including face-to-face time with the patient and, reviewing past medical record, and charting on the date of service.   MRI findings treatment plan and options.

## 2020-06-23 ENCOUNTER — Encounter: Payer: Self-pay | Admitting: Family Medicine

## 2020-06-23 ENCOUNTER — Ambulatory Visit (INDEPENDENT_AMBULATORY_CARE_PROVIDER_SITE_OTHER): Payer: 59 | Admitting: Family Medicine

## 2020-06-23 ENCOUNTER — Other Ambulatory Visit: Payer: Self-pay

## 2020-06-23 VITALS — BP 138/90 | HR 106 | Ht 65.0 in | Wt 187.2 lb

## 2020-06-23 DIAGNOSIS — G8929 Other chronic pain: Secondary | ICD-10-CM

## 2020-06-23 DIAGNOSIS — M66322 Spontaneous rupture of flexor tendons, left upper arm: Secondary | ICD-10-CM | POA: Diagnosis not present

## 2020-06-23 DIAGNOSIS — M25512 Pain in left shoulder: Secondary | ICD-10-CM

## 2020-06-23 NOTE — Patient Instructions (Signed)
Thank you for coming in today.  You should hear from surgery shortly.  Let me know if you have a problem.

## 2020-06-30 ENCOUNTER — Ambulatory Visit (INDEPENDENT_AMBULATORY_CARE_PROVIDER_SITE_OTHER): Payer: 59 | Admitting: Physician Assistant

## 2020-06-30 ENCOUNTER — Ambulatory Visit: Payer: Self-pay

## 2020-06-30 ENCOUNTER — Encounter: Payer: Self-pay | Admitting: Physician Assistant

## 2020-06-30 ENCOUNTER — Other Ambulatory Visit: Payer: Self-pay

## 2020-06-30 DIAGNOSIS — S46212A Strain of muscle, fascia and tendon of other parts of biceps, left arm, initial encounter: Secondary | ICD-10-CM | POA: Diagnosis not present
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
## 2020-06-30 NOTE — Progress Notes (Signed)
Subjective: He is here for left glenohumeral injection.  He has a long head biceps tear for the past 2 months, with pain keeping him awake at night.  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  Full active range of motion of the shoulder with pain at the extremes.  Procedure: Ultrasound guided injection is preferred based studies that show increased duration, increased effect, greater accuracy, decreased procedural pain, increased response rate, and decreased cost with ultrasound guided versus blind injection.   Verbal informed consent obtained.  Time-out conducted.  Noted no overlying erythema, induration, or other signs of local infection. Ultrasound-guided left glenohumeral injection: After sterile prep with Betadine, injected 4 cc 0.25% bupivocaine without epinephrine and 6 mg betamethasone using a 22-gauge spinal needle, passing the needle from posterior approach into the glenohumeral joint.  Injectate seen filling joint capsule.

## 2020-07-02 ENCOUNTER — Telehealth: Payer: Self-pay | Admitting: Family Medicine

## 2020-07-02 DIAGNOSIS — G8929 Other chronic pain: Secondary | ICD-10-CM

## 2020-07-02 DIAGNOSIS — M25512 Pain in left shoulder: Secondary | ICD-10-CM

## 2020-07-02 DIAGNOSIS — M66322 Spontaneous rupture of flexor tendons, left upper arm: Secondary | ICD-10-CM

## 2020-07-02 NOTE — Telephone Encounter (Signed)
Patient called stating that he was seen at [facility] but did not agree with some of what the doctor their said based on what he was told by Dr [name]. (he said that the cyst seemed to be a concern to Dr [name] but Dr Chestine Spore said it would heal on its own).  He wanted to know if he should get a second opinion or where Dr [name] thinks he should go from here.
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
## 2020-07-04 NOTE — Telephone Encounter (Signed)
Called both pt's cell phone and his wife's number and unable to LM on either line.  If pt calls back, please either relay Dr. Zollie Pee response or direct back to clinical staff and we can discuss w/ pt.

## 2020-07-06 ENCOUNTER — Telehealth: Payer: Self-pay | Admitting: Internal Medicine

## 2020-07-06 DIAGNOSIS — M109 Gout, unspecified: Secondary | ICD-10-CM

## 2020-07-15 NOTE — Telephone Encounter (Signed)
Spoke w/ pt and he would like a new referral to see a different orthopedic surgeon.

## 2020-07-15 NOTE — Telephone Encounter (Signed)
I have attempted to call pt and there was no answer. I did leave a voicemail to call back and inform him that he will need to have a OV in order to fill medication.   Plan: We are unable to fill this medication until he is seen in the office for a visit.   Pt was last seen on 03/17/20 by Vernona Rieger who told him that he will need a 40m follow up visit. Also the rx that pt has requested has not been filled since 04/04/2019.

## 2020-07-15 NOTE — Telephone Encounter (Signed)
   Patient requesting refill for indomethacin (INDOCIN) 50 MG capsule  Gout flare up in ankle  Please advise

## 2020-07-16 ENCOUNTER — Other Ambulatory Visit: Payer: Self-pay | Admitting: Family

## 2020-07-16 DIAGNOSIS — M109 Gout, unspecified: Secondary | ICD-10-CM

## 2020-07-16 MED ORDER — INDOMETHACIN 50 MG PO CAPS: ORAL_CAPSULE | ORAL | 0 refills | Status: DC

## 2020-07-16 NOTE — Addendum Note (Signed)
Addended by: Rodolph Bong on: 07/16/2020 08:55 AM   Modules accepted: Orders

## 2020-07-16 NOTE — Telephone Encounter (Signed)
Referral placed to provider [facility]

## 2020-07-16 NOTE — Telephone Encounter (Signed)
Just to clarify- He does have documented gout so I went ahead and gave short term Indocin refill for him to help with current flare. Needs to keep appointment for next week- can re-check gout flare and re-check his diabetes labs;

## 2020-07-16 NOTE — Telephone Encounter (Signed)
I have called pt and he does have an upcoming appointment on Monday.   Fyi to the provider.

## 2020-07-16 NOTE — Telephone Encounter (Signed)
I have called the pt back and relayed the information to him from the provider below. No answer so I left a message on his answering machine.

## 2020-07-17 NOTE — Telephone Encounter (Signed)
Called pt and informed him that referral has been placed to [facility]. Pt verbalizes understanding.

## 2020-07-21 ENCOUNTER — Ambulatory Visit: Payer: 59 | Admitting: Family

## 2020-07-29 ENCOUNTER — Ambulatory Visit: Payer: 59 | Admitting: Family

## 2020-07-29 DIAGNOSIS — Z0289 Encounter for other administrative examinations: Secondary | ICD-10-CM

## 2020-08-08 ENCOUNTER — Encounter: Payer: Self-pay | Admitting: Family

## 2020-08-08 ENCOUNTER — Other Ambulatory Visit: Payer: Self-pay

## 2020-08-08 ENCOUNTER — Ambulatory Visit (INDEPENDENT_AMBULATORY_CARE_PROVIDER_SITE_OTHER): Payer: 59 | Admitting: Family

## 2020-08-08 VITALS — BP 154/102 | HR 97 | Temp 98.7°F | Ht 65.0 in | Wt 191.8 lb

## 2020-08-08 DIAGNOSIS — R202 Paresthesia of skin: Secondary | ICD-10-CM

## 2020-08-08 DIAGNOSIS — E119 Type 2 diabetes mellitus without complications: Secondary | ICD-10-CM | POA: Diagnosis not present

## 2020-08-08 DIAGNOSIS — Z125 Encounter for screening for malignant neoplasm of prostate: Secondary | ICD-10-CM

## 2020-08-08 DIAGNOSIS — M5416 Radiculopathy, lumbar region: Secondary | ICD-10-CM

## 2020-08-08 DIAGNOSIS — M109 Gout, unspecified: Secondary | ICD-10-CM

## 2020-08-08 DIAGNOSIS — I10 Essential (primary) hypertension: Secondary | ICD-10-CM | POA: Diagnosis not present

## 2020-08-08 DIAGNOSIS — R200 Anesthesia of skin: Secondary | ICD-10-CM

## 2020-08-08 MED ORDER — INDOMETHACIN 50 MG PO CAPS: ORAL_CAPSULE | ORAL | 0 refills | Status: DC

## 2020-08-08 MED ORDER — TIZANIDINE HCL 4 MG PO TABS: 4.0000 mg | ORAL_TABLET | Freq: Four times a day (QID) | ORAL | 0 refills | Status: DC | PRN
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
## 2020-08-08 NOTE — Patient Instructions (Addendum)
  Ryan Rhodes [id number]- call them about the referral for the bicep;   Please start checking your blood pressure and send me some readings in the next 1-2 weeks;

## 2020-08-09 LAB — HEMOGLOBIN A1C
Hgb A1c MFr Bld: 9.4 % of total Hgb — ABNORMAL HIGH (ref <5.7)
Mean Plasma Glucose: 223 mg/dL
eAG (mmol/L): 12.4 mmol/L

## 2020-08-09 LAB — CBC WITH DIFFERENTIAL/PLATELET
Absolute Monocytes: 795 cells/uL (ref 200–950)
Basophils Absolute: 40 cells/uL (ref 0–200)
Basophils Relative: 0.8 %
Eosinophils Absolute: 40 cells/uL (ref 15–500)
Eosinophils Relative: 0.8 %
HCT: 47.6 % (ref 38.5–50.0)
Hemoglobin: 15.5 g/dL (ref 13.2–17.1)
Lymphs Abs: 2120 cells/uL (ref 850–3900)
MCH: 26.5 pg — ABNORMAL LOW (ref 27.0–33.0)
MCHC: 32.6 g/dL (ref 32.0–36.0)
MCV: 81.5 fL (ref 80.0–100.0)
MPV: 9.7 fL (ref 7.5–12.5)
Monocytes Relative: 15.9 %
Neutro Abs: 2005 cells/uL (ref 1500–7800)
Neutrophils Relative %: 40.1 %
Platelets: 329 10*3/uL (ref 140–400)
RBC: 5.84 10*6/uL — ABNORMAL HIGH (ref 4.20–5.80)
RDW: 13 % (ref 11.0–15.0)
Total Lymphocyte: 42.4 %
WBC: 5 10*3/uL (ref 3.8–10.8)

## 2020-08-09 LAB — COMPREHENSIVE METABOLIC PANEL
AG Ratio: 1.4 (calc) (ref 1.0–2.5)
ALT: 52 U/L — ABNORMAL HIGH (ref 9–46)
AST: 51 U/L — ABNORMAL HIGH (ref 10–35)
Albumin: 4.7 g/dL (ref 3.6–5.1)
Alkaline phosphatase (APISO): 59 U/L (ref 35–144)
BUN: 11 mg/dL (ref 7–25)
CO2: 24 mmol/L (ref 20–32)
Calcium: 10.5 mg/dL — ABNORMAL HIGH (ref 8.6–10.3)
Chloride: 100 mmol/L (ref 98–110)
Creat: 0.99 mg/dL (ref 0.70–1.33)
Globulin: 3.4 g/dL (calc) (ref 1.9–3.7)
Glucose, Bld: 128 mg/dL — ABNORMAL HIGH (ref 65–99)
Potassium: 4.3 mmol/L (ref 3.5–5.3)
Sodium: 136 mmol/L (ref 135–146)
Total Bilirubin: 0.4 mg/dL (ref 0.2–1.2)
Total Protein: 8.1 g/dL (ref 6.1–8.1)

## 2020-08-09 LAB — B12 AND FOLATE PANEL
Folate: >24 ng/mL
Vitamin B-12: 698 pg/mL (ref 200–1100)

## 2020-08-09 LAB — PSA: PSA: 0.52 ng/mL (ref <=4.00)

## 2020-08-09 LAB — LIPID PANEL
Cholesterol: 231 mg/dL — ABNORMAL HIGH (ref <200)
HDL: 49 mg/dL (ref >=40)
LDL Cholesterol (Calc): 164 mg/dL (calc) — ABNORMAL HIGH
Non-HDL Cholesterol (Calc): 182 mg/dL (calc) — ABNORMAL HIGH (ref <130)
Total CHOL/HDL Ratio: 4.7 (calc) (ref <5.0)
Triglycerides: 78 mg/dL (ref <150)

## 2020-08-09 LAB — TSH: TSH: 1.06 mIU/L (ref 0.40–4.50)

## 2020-08-11 ENCOUNTER — Other Ambulatory Visit: Payer: Self-pay | Admitting: Family

## 2020-08-11 DIAGNOSIS — E119 Type 2 diabetes mellitus without complications: Secondary | ICD-10-CM

## 2020-08-11 MED ORDER — TRULICITY 3 MG/0.5ML ~~LOC~~ SOAJ: 3.0000 mg | SUBCUTANEOUS | 2 refills | Status: DC

## 2020-08-11 MED ORDER — METFORMIN HCL ER 500 MG PO TB24: 1000.0000 mg | ORAL_TABLET | Freq: Two times a day (BID) | ORAL | 1 refills | Status: DC

## 2020-08-25 ENCOUNTER — Ambulatory Visit
Admission: RE | Admit: 2020-08-25 | Discharge: 2020-08-25 | Disposition: A | Discharge status: Home / Self Care | Payer: 59 | Source: Ambulatory Visit | Attending: Family | Admitting: Family

## 2020-08-25 ENCOUNTER — Other Ambulatory Visit: Payer: Self-pay

## 2020-08-25 DIAGNOSIS — M5416 Radiculopathy, lumbar region: Secondary | ICD-10-CM

## 2020-08-26 ENCOUNTER — Other Ambulatory Visit: Payer: Self-pay | Admitting: Family

## 2020-08-26 DIAGNOSIS — R937 Abnormal findings on diagnostic imaging of other parts of musculoskeletal system: Secondary | ICD-10-CM

## 2020-08-28 ENCOUNTER — Telehealth: Payer: Self-pay | Admitting: Family

## 2020-08-28 NOTE — Telephone Encounter (Signed)
I have called pt and he stated that he will be back on 09/12/20. So we have made the appointment for 1:40 that day.   FYI to provider.

## 2020-08-28 NOTE — Telephone Encounter (Signed)
I got a surgical clearance from Dr. Everardo Pacific at [facility]. We need to have him follow up in the next 2 weeks to re-check the hgba1c- hopefully the changes we made in early may will have caused enough improvement that we can get him cleared. His Hgba1c has to be below 8.5 and he was at 9.4. His blood pressure was not controlled either and we need to get this down as well before surgery.

## 2020-09-12 ENCOUNTER — Other Ambulatory Visit: Payer: Self-pay

## 2020-09-12 ENCOUNTER — Ambulatory Visit (INDEPENDENT_AMBULATORY_CARE_PROVIDER_SITE_OTHER): Payer: 59 | Admitting: Family

## 2020-09-12 ENCOUNTER — Encounter: Payer: Self-pay | Admitting: Family

## 2020-09-12 VITALS — BP 128/88 | HR 78 | Temp 97.8°F | Resp 18 | Ht 65.0 in | Wt 187.0 lb

## 2020-09-12 DIAGNOSIS — I10 Essential (primary) hypertension: Secondary | ICD-10-CM | POA: Diagnosis not present

## 2020-09-12 DIAGNOSIS — E119 Type 2 diabetes mellitus without complications: Secondary | ICD-10-CM

## 2020-09-12 DIAGNOSIS — E782 Mixed hyperlipidemia: Secondary | ICD-10-CM

## 2020-09-12 MED ORDER — ALLOPURINOL 100 MG PO TABS: 100.0000 mg | ORAL_TABLET | Freq: Every day | ORAL | 3 refills | Status: DC

## 2020-09-12 MED ORDER — ATORVASTATIN CALCIUM 40 MG PO TABS: 40.0000 mg | ORAL_TABLET | Freq: Every day | ORAL | 3 refills | Status: DC

## 2020-09-12 MED ORDER — ENALAPRIL MALEATE 20 MG PO TABS: ORAL_TABLET | ORAL | 1 refills | Status: DC
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
## 2020-09-13 LAB — CBC WITH DIFFERENTIAL/PLATELET
Absolute Monocytes: 781 cells/uL (ref 200–950)
Basophils Absolute: 68 cells/uL (ref 0–200)
Basophils Relative: 1.1 %
Eosinophils Absolute: 112 cells/uL (ref 15–500)
Eosinophils Relative: 1.8 %
HCT: 48.5 % (ref 38.5–50.0)
Hemoglobin: 16.2 g/dL (ref 13.2–17.1)
Lymphs Abs: 2610 cells/uL (ref 850–3900)
MCH: 27.1 pg (ref 27.0–33.0)
MCHC: 33.4 g/dL (ref 32.0–36.0)
MCV: 81.2 fL (ref 80.0–100.0)
MPV: 9.6 fL (ref 7.5–12.5)
Monocytes Relative: 12.6 %
Neutro Abs: 2629 cells/uL (ref 1500–7800)
Neutrophils Relative %: 42.4 %
Platelets: 409 10*3/uL — ABNORMAL HIGH (ref 140–400)
RBC: 5.97 10*6/uL — ABNORMAL HIGH (ref 4.20–5.80)
RDW: 13.4 % (ref 11.0–15.0)
Total Lymphocyte: 42.1 %
WBC: 6.2 10*3/uL (ref 3.8–10.8)

## 2020-09-13 LAB — COMPREHENSIVE METABOLIC PANEL
AG Ratio: 1.3 (calc) (ref 1.0–2.5)
ALT: 36 U/L (ref 9–46)
AST: 32 U/L (ref 10–35)
Albumin: 4.8 g/dL (ref 3.6–5.1)
Alkaline phosphatase (APISO): 59 U/L (ref 35–144)
BUN: 13 mg/dL (ref 7–25)
CO2: 24 mmol/L (ref 20–32)
Calcium: 10.9 mg/dL — ABNORMAL HIGH (ref 8.6–10.3)
Chloride: 101 mmol/L (ref 98–110)
Creat: 0.96 mg/dL (ref 0.70–1.33)
Globulin: 3.6 g/dL (calc) (ref 1.9–3.7)
Glucose, Bld: 118 mg/dL — ABNORMAL HIGH (ref 65–99)
Potassium: 4.6 mmol/L (ref 3.5–5.3)
Sodium: 138 mmol/L (ref 135–146)
Total Bilirubin: 0.4 mg/dL (ref 0.2–1.2)
Total Protein: 8.4 g/dL — ABNORMAL HIGH (ref 6.1–8.1)

## 2020-09-13 LAB — HEMOGLOBIN A1C
Hgb A1c MFr Bld: 8.2 % of total Hgb — ABNORMAL HIGH (ref <5.7)
Mean Plasma Glucose: 189 mg/dL
eAG (mmol/L): 10.4 mmol/L

## 2020-09-16 NOTE — Telephone Encounter (Signed)
I have received the completed clearance forms and faxed them back to [facility] and [place].

## 2020-09-24 ENCOUNTER — Ambulatory Visit (HOSPITAL_BASED_OUTPATIENT_CLINIC_OR_DEPARTMENT_OTHER)
Admission: RE | Admit: 2020-09-24 | Discharge: 2020-09-24 | Disposition: A | Discharge status: Home / Self Care | Payer: 59 | Source: Ambulatory Visit | Attending: Medical | Admitting: Medical

## 2020-09-24 ENCOUNTER — Ambulatory Visit (INDEPENDENT_AMBULATORY_CARE_PROVIDER_SITE_OTHER): Payer: 59 | Admitting: Medical

## 2020-09-24 ENCOUNTER — Encounter: Payer: Self-pay | Admitting: Medical

## 2020-09-24 ENCOUNTER — Other Ambulatory Visit: Payer: Self-pay

## 2020-09-24 VITALS — BP 122/89 | HR 91 | Resp 20 | Ht 65.0 in | Wt 185.0 lb

## 2020-09-24 DIAGNOSIS — M25551 Pain in right hip: Secondary | ICD-10-CM | POA: Insufficient documentation

## 2020-09-24 DIAGNOSIS — M5431 Sciatica, right side: Secondary | ICD-10-CM

## 2020-09-24 DIAGNOSIS — M5416 Radiculopathy, lumbar region: Secondary | ICD-10-CM

## 2020-09-24 MED ORDER — KETOROLAC TROMETHAMINE 60 MG/2ML IM SOLN
60.0000 mg | Freq: Once | INTRAMUSCULAR | Status: AC
Start: 2020-09-24 — End: 2020-09-24
  Administered 2020-09-24: 60 mg via INTRAMUSCULAR

## 2020-09-24 MED ORDER — DICLOFENAC SODIUM 75 MG PO TBEC: 75.0000 mg | DELAYED_RELEASE_TABLET | Freq: Two times a day (BID) | ORAL | 0 refills | Status: DC

## 2020-09-24 MED ORDER — TRAMADOL HCL 50 MG PO TABS: 50.0000 mg | ORAL_TABLET | Freq: Four times a day (QID) | ORAL | 0 refills | Status: AC | PRN

## 2020-09-24 NOTE — Patient Instructions (Signed)
New onset right-sided hip pain with history of bilateral sciatica with significant findings on MRI of lumbar spine.  Will give Toradol 60 mg IM injection today.  Start diclofenac 75 mg 1 tablet twice daily tomorrow morning.  Not use Indocin presently well using diclofenac.  Making limited tramadol prescription available for severe pain.  Rx advisement given.  Work excuse/note given today.  X-ray of right hip to be done today.  Follow-up in 7 to 10 days or as needed.  If by Monday pain is not improved let us know as in that case could extend your work note.

## 2020-09-24 NOTE — Addendum Note (Signed)
Addended by: Gwenevere Abbot on: 09/24/2020 02:59 PM   Modules accepted: Orders
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
## 2020-09-24 NOTE — Addendum Note (Signed)
Addended by: Maximino Sarin on: 09/24/2020 02:34 PM   Modules accepted: Orders

## 2020-09-26 ENCOUNTER — Telehealth: Payer: Self-pay | Admitting: Family

## 2020-09-26 ENCOUNTER — Other Ambulatory Visit: Payer: Self-pay | Admitting: Family

## 2020-09-26 MED ORDER — HYDROCODONE-ACETAMINOPHEN 5-325 MG PO TABS: 1.0000 | ORAL_TABLET | Freq: Four times a day (QID) | ORAL | 0 refills | Status: DC | PRN

## 2020-09-26 NOTE — Telephone Encounter (Signed)
Pt, had visit on 09/24/20 with Esperanza Richters he was prescribe Tramadol pt called today and said the tramadol isn't working he need something stronger   Please advice

## 2020-09-26 NOTE — Telephone Encounter (Signed)
I have called pt back and gathered more information. He stated that that he was seen on Wednesday and got a tramadol 60 im and that did not help. He has received 8 tabs for home use and he is almost out of med.   I have called Eulah Pont and [employer] and they have received the paperwork for the shoulder repair and will call pt to get that scheduled. While I was on the phone with them I scheduled an appointment for the pt with Dr. Everardo Pacific on 09/30/20 @ 7:30 am. Pt has agreed to go to that appt.  I have called pt and informed him that we will give him something to help until the next visit on 09/30/20 and write him out until 10/01/20. I will send it to pt via mychart.

## 2020-09-26 NOTE — Telephone Encounter (Signed)
Medication has been sent to pharmacy and letter has been sent to pt via my-chart.

## 2020-09-26 NOTE — Progress Notes (Signed)
Rx has been sent to pharmacy and letter has been sent to pt via my-chart.

## 2020-10-01 ENCOUNTER — Telehealth: Payer: Self-pay

## 2020-10-01 NOTE — Telephone Encounter (Signed)
I have called the pt and to follow up on how he is doing. He stated that he did go to the appointment on yesterday and he will need to have some FMLA paperwork completed. I have informed him that the next step is to get those completed; however, since he is seeing Dr. Everardo Pacific, [place] about the back and hip they should be the ones to complete it. Pt stated understanding and I have faxed the letter to HR per pt request.

## 2020-10-01 NOTE — Telephone Encounter (Signed)
Caller states he was seen last week and was given a work note to be out through today. Caller states he was told he would be given an extension for this note because he was given more medication. Caller states he has not heard anything about the extension yet and needs it for his employer. Additional Comment Caller states he would like the information sent to [place] from HR at [address], phone number [phone], Fax # [phone]. Office hours provided.

## 2020-10-07 NOTE — Telephone Encounter (Signed)
I have called the pt and there was no answer so I left a message to call back. I then called French Ana from [facility] office and informed her that pt will need to get these forms for Dr. Everardo Pacific and she stated understanding and will forward then paperwork to them at [facility] and [place].

## 2020-10-08 ENCOUNTER — Encounter (HOSPITAL_BASED_OUTPATIENT_CLINIC_OR_DEPARTMENT_OTHER): Payer: Self-pay | Admitting: Orthopaedic Surgery

## 2020-10-08 ENCOUNTER — Other Ambulatory Visit: Payer: Self-pay

## 2020-10-09 ENCOUNTER — Ambulatory Visit: Payer: 59 | Admitting: Family

## 2020-10-15 ENCOUNTER — Encounter (HOSPITAL_BASED_OUTPATIENT_CLINIC_OR_DEPARTMENT_OTHER)
Admission: RE | Admit: 2020-10-15 | Discharge: 2020-10-15 | Disposition: A | Discharge status: Home / Self Care | Payer: 59 | Source: Ambulatory Visit | Attending: Orthopaedic Surgery | Admitting: Orthopaedic Surgery

## 2020-10-15 DIAGNOSIS — Z7984 Long term (current) use of oral hypoglycemic drugs: Secondary | ICD-10-CM | POA: Diagnosis not present

## 2020-10-15 DIAGNOSIS — I10 Essential (primary) hypertension: Secondary | ICD-10-CM | POA: Diagnosis not present

## 2020-10-15 DIAGNOSIS — M19012 Primary osteoarthritis, left shoulder: Secondary | ICD-10-CM | POA: Diagnosis not present

## 2020-10-15 DIAGNOSIS — M7542 Impingement syndrome of left shoulder: Secondary | ICD-10-CM | POA: Diagnosis not present

## 2020-10-15 DIAGNOSIS — E119 Type 2 diabetes mellitus without complications: Secondary | ICD-10-CM | POA: Diagnosis not present

## 2020-10-15 DIAGNOSIS — S46112A Strain of muscle, fascia and tendon of long head of biceps, left arm, initial encounter: Secondary | ICD-10-CM | POA: Diagnosis not present

## 2020-10-15 DIAGNOSIS — Z79899 Other long term (current) drug therapy: Secondary | ICD-10-CM | POA: Diagnosis not present

## 2020-10-15 LAB — BASIC METABOLIC PANEL
Anion gap: 10 (ref 5–15)
BUN: 8 mg/dL (ref 6–20)
CO2: 25 mmol/L (ref 22–32)
Calcium: 9.8 mg/dL (ref 8.9–10.3)
Chloride: 102 mmol/L (ref 98–111)
Creatinine, Ser: 0.94 mg/dL (ref 0.61–1.24)
GFR, Estimated: >60 mL/min (ref >60)
Glucose, Bld: 148 mg/dL — ABNORMAL HIGH (ref 70–99)
Potassium: 3.8 mmol/L (ref 3.5–5.1)
Sodium: 137 mmol/L (ref 135–145)

## 2020-10-15 NOTE — Progress Notes (Signed)
Called patient to come in for Pre op blood work for surgery tomorrow 10/16/20. No VMB and no answer after multiple attempts.
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
## 2020-10-16 ENCOUNTER — Ambulatory Visit (HOSPITAL_BASED_OUTPATIENT_CLINIC_OR_DEPARTMENT_OTHER): Payer: 59 | Admitting: Certified Registered"

## 2020-10-16 ENCOUNTER — Ambulatory Visit (HOSPITAL_BASED_OUTPATIENT_CLINIC_OR_DEPARTMENT_OTHER)
Admission: RE | Admit: 2020-10-16 | Discharge: 2020-10-16 | Disposition: A | Discharge status: Home / Self Care | Payer: 59 | Attending: Orthopaedic Surgery | Admitting: Orthopaedic Surgery

## 2020-10-16 ENCOUNTER — Encounter (HOSPITAL_BASED_OUTPATIENT_CLINIC_OR_DEPARTMENT_OTHER)
Admission: RE | Disposition: A | Discharge status: Home / Self Care | Payer: Self-pay | Source: Home / Self Care | Attending: Orthopaedic Surgery

## 2020-10-16 ENCOUNTER — Other Ambulatory Visit: Payer: Self-pay

## 2020-10-16 ENCOUNTER — Encounter (HOSPITAL_BASED_OUTPATIENT_CLINIC_OR_DEPARTMENT_OTHER): Payer: Self-pay | Admitting: Orthopaedic Surgery

## 2020-10-16 DIAGNOSIS — Z79899 Other long term (current) drug therapy: Secondary | ICD-10-CM | POA: Insufficient documentation

## 2020-10-16 DIAGNOSIS — I10 Essential (primary) hypertension: Secondary | ICD-10-CM | POA: Insufficient documentation

## 2020-10-16 DIAGNOSIS — Z7984 Long term (current) use of oral hypoglycemic drugs: Secondary | ICD-10-CM | POA: Insufficient documentation

## 2020-10-16 DIAGNOSIS — S46112A Strain of muscle, fascia and tendon of long head of biceps, left arm, initial encounter: Secondary | ICD-10-CM | POA: Insufficient documentation

## 2020-10-16 DIAGNOSIS — M19012 Primary osteoarthritis, left shoulder: Secondary | ICD-10-CM | POA: Insufficient documentation

## 2020-10-16 DIAGNOSIS — M7542 Impingement syndrome of left shoulder: Secondary | ICD-10-CM | POA: Insufficient documentation

## 2020-10-16 DIAGNOSIS — E119 Type 2 diabetes mellitus without complications: Secondary | ICD-10-CM | POA: Insufficient documentation

## 2020-10-16 HISTORY — PX: SHOULDER ARTHROSCOPY WITH ROTATOR CUFF REPAIR AND OPEN BICEPS TENODESIS: SHX6677

## 2020-10-16 LAB — GLUCOSE, CAPILLARY
Glucose-Capillary: 122 mg/dL — ABNORMAL HIGH (ref 70–99)
Glucose-Capillary: 148 mg/dL — ABNORMAL HIGH (ref 70–99)

## 2020-10-16 SURGERY — SHOULDER ARTHROSCOPY WITH ROTATOR CUFF REPAIR AND OPEN BICEPS TENODESIS
Anesthesia: General | Site: Shoulder | Laterality: Left

## 2020-10-16 MED ORDER — FENTANYL CITRATE (PF) 100 MCG/2ML IJ SOLN: 25.0000 ug | INTRAMUSCULAR | Status: DC | PRN

## 2020-10-16 MED ORDER — OXYCODONE HCL 5 MG PO TABS
ORAL_TABLET | ORAL | Status: AC
  Filled 2020-10-16: qty 1

## 2020-10-16 MED ORDER — FENTANYL CITRATE (PF) 100 MCG/2ML IJ SOLN
INTRAMUSCULAR | Status: AC
  Filled 2020-10-16: qty 4

## 2020-10-16 MED ORDER — AMISULPRIDE (ANTIEMETIC) 5 MG/2ML IV SOLN: 10.0000 mg | Freq: Once | INTRAVENOUS | Status: DC | PRN

## 2020-10-16 MED ORDER — DEXAMETHASONE SODIUM PHOSPHATE 10 MG/ML IJ SOLN
INTRAMUSCULAR | Status: DC | PRN
  Administered 2020-10-16: 8 mg via INTRAVENOUS

## 2020-10-16 MED ORDER — FENTANYL CITRATE (PF) 100 MCG/2ML IJ SOLN
100.0000 ug | Freq: Once | INTRAMUSCULAR | Status: AC
  Administered 2020-10-16: 100 ug via INTRAVENOUS

## 2020-10-16 MED ORDER — DEXAMETHASONE SODIUM PHOSPHATE 10 MG/ML IJ SOLN
INTRAMUSCULAR | Status: AC
  Filled 2020-10-16: qty 1

## 2020-10-16 MED ORDER — PHENYLEPHRINE 40 MCG/ML (10ML) SYRINGE FOR IV PUSH (FOR BLOOD PRESSURE SUPPORT)
PREFILLED_SYRINGE | INTRAVENOUS | Status: AC
  Filled 2020-10-16: qty 10

## 2020-10-16 MED ORDER — MIDAZOLAM HCL 2 MG/2ML IJ SOLN
2.0000 mg | Freq: Once | INTRAMUSCULAR | Status: AC
  Administered 2020-10-16: 2 mg via INTRAVENOUS

## 2020-10-16 MED ORDER — BUPIVACAINE HCL (PF) 0.5 % IJ SOLN
INTRAMUSCULAR | Status: DC | PRN
  Administered 2020-10-16: 15 mL via PERINEURAL

## 2020-10-16 MED ORDER — LABETALOL HCL 5 MG/ML IV SOLN
INTRAVENOUS | Status: AC
  Filled 2020-10-16: qty 4

## 2020-10-16 MED ORDER — LABETALOL HCL 5 MG/ML IV SOLN
5.0000 mg | Freq: Once | INTRAVENOUS | Status: AC
  Administered 2020-10-16: 5 mg via INTRAVENOUS

## 2020-10-16 MED ORDER — OXYCODONE HCL 5 MG PO TABS
5.0000 mg | ORAL_TABLET | Freq: Once | ORAL | Status: AC | PRN
  Administered 2020-10-16: 5 mg via ORAL

## 2020-10-16 MED ORDER — LABETALOL HCL 5 MG/ML IV SOLN
10.0000 mg | Freq: Once | INTRAVENOUS | Status: AC
  Administered 2020-10-16: 10 mg via INTRAVENOUS

## 2020-10-16 MED ORDER — MIDAZOLAM HCL 2 MG/2ML IJ SOLN
INTRAMUSCULAR | Status: AC
  Filled 2020-10-16: qty 2

## 2020-10-16 MED ORDER — OXYCODONE HCL 5 MG/5ML PO SOLN: 5.0000 mg | Freq: Once | ORAL | Status: AC | PRN

## 2020-10-16 MED ORDER — ROCURONIUM BROMIDE 10 MG/ML (PF) SYRINGE
PREFILLED_SYRINGE | INTRAVENOUS | Status: AC
  Filled 2020-10-16: qty 10

## 2020-10-16 MED ORDER — FENTANYL CITRATE (PF) 100 MCG/2ML IJ SOLN
INTRAMUSCULAR | Status: AC
  Filled 2020-10-16: qty 2

## 2020-10-16 MED ORDER — LACTATED RINGERS IV SOLN: INTRAVENOUS | Status: DC

## 2020-10-16 MED ORDER — METHOCARBAMOL 500 MG PO TABS: 500.0000 mg | ORAL_TABLET | Freq: Three times a day (TID) | ORAL | 0 refills | Status: DC | PRN

## 2020-10-16 MED ORDER — ONDANSETRON HCL 4 MG/2ML IJ SOLN: 4.0000 mg | Freq: Once | INTRAMUSCULAR | Status: DC | PRN

## 2020-10-16 MED ORDER — BUPIVACAINE LIPOSOME 1.3 % IJ SUSP
INTRAMUSCULAR | Status: DC | PRN
  Administered 2020-10-16: 10 mL

## 2020-10-16 MED ORDER — LIDOCAINE HCL (PF) 2 % IJ SOLN
INTRAMUSCULAR | Status: AC
  Filled 2020-10-16: qty 5

## 2020-10-16 MED ORDER — SUGAMMADEX SODIUM 500 MG/5ML IV SOLN
INTRAVENOUS | Status: DC | PRN
  Administered 2020-10-16: 330 mg via INTRAVENOUS

## 2020-10-16 MED ORDER — HYDRALAZINE HCL 20 MG/ML IJ SOLN
INTRAMUSCULAR | Status: AC
  Filled 2020-10-16: qty 1

## 2020-10-16 MED ORDER — SODIUM CHLORIDE 0.9 % IR SOLN
Status: DC | PRN
  Administered 2020-10-16: 6000 mL

## 2020-10-16 MED ORDER — ROCURONIUM BROMIDE 100 MG/10ML IV SOLN
INTRAVENOUS | Status: DC | PRN
  Administered 2020-10-16: 80 mg via INTRAVENOUS

## 2020-10-16 MED ORDER — ACETAMINOPHEN 500 MG PO TABS: 1000.0000 mg | ORAL_TABLET | Freq: Three times a day (TID) | ORAL | 0 refills | Status: AC

## 2020-10-16 MED ORDER — CEFAZOLIN SODIUM-DEXTROSE 2-4 GM/100ML-% IV SOLN
INTRAVENOUS | Status: AC
  Filled 2020-10-16: qty 100

## 2020-10-16 MED ORDER — OXYCODONE HCL 5 MG PO TABS: ORAL_TABLET | ORAL | 0 refills | Status: AC

## 2020-10-16 MED ORDER — PROPOFOL 10 MG/ML IV BOLUS
INTRAVENOUS | Status: DC | PRN
  Administered 2020-10-16: 200 mg via INTRAVENOUS

## 2020-10-16 MED ORDER — PHENYLEPHRINE HCL (PRESSORS) 10 MG/ML IV SOLN
INTRAVENOUS | Status: DC | PRN
  Administered 2020-10-16 (×2): 40 ug via INTRAVENOUS

## 2020-10-16 MED ORDER — ONDANSETRON HCL 4 MG PO TABS: 4.0000 mg | ORAL_TABLET | Freq: Three times a day (TID) | ORAL | 0 refills | Status: AC | PRN

## 2020-10-16 MED ORDER — CEFAZOLIN SODIUM-DEXTROSE 2-4 GM/100ML-% IV SOLN
2.0000 g | INTRAVENOUS | Status: AC
  Administered 2020-10-16: 2 g via INTRAVENOUS

## 2020-10-16 MED ORDER — HYDRALAZINE HCL 20 MG/ML IJ SOLN
10.0000 mg | Freq: Once | INTRAMUSCULAR | Status: AC
  Administered 2020-10-16: 10 mg via INTRAVENOUS

## 2020-10-16 MED ORDER — FENTANYL CITRATE (PF) 100 MCG/2ML IJ SOLN
INTRAMUSCULAR | Status: DC | PRN
  Administered 2020-10-16: 50 ug via INTRAVENOUS

## 2020-10-16 MED ORDER — ONDANSETRON HCL 4 MG/2ML IJ SOLN
INTRAMUSCULAR | Status: AC
  Filled 2020-10-16: qty 2

## 2020-10-16 MED ORDER — VANCOMYCIN HCL 1000 MG IV SOLR
INTRAVENOUS | Status: DC | PRN
  Administered 2020-10-16: 1000 mg

## 2020-10-16 MED ORDER — ONDANSETRON HCL 4 MG/2ML IJ SOLN
INTRAMUSCULAR | Status: DC | PRN
  Administered 2020-10-16: 4 mg via INTRAVENOUS

## 2020-10-16 MED ORDER — PROPOFOL 10 MG/ML IV BOLUS
INTRAVENOUS | Status: AC
  Filled 2020-10-16: qty 20

## 2020-10-16 MED ORDER — CELECOXIB 100 MG PO CAPS: 100.0000 mg | ORAL_CAPSULE | Freq: Two times a day (BID) | ORAL | 0 refills | Status: AC

## 2020-10-16 SURGICAL SUPPLY — 66 items
AID PSTN UNV HD RSTRNT DISP (MISCELLANEOUS) ×2
APL PRP STRL LF DISP 70% ISPRP (MISCELLANEOUS) ×2
BLADE EXCALIBUR 4.0X13 (MISCELLANEOUS) ×3 IMPLANT
BLADE SURG 10 STRL SS (BLADE) ×3 IMPLANT
BLADE SURG 15 STRL LF DISP TIS (BLADE) ×2 IMPLANT
BLADE SURG 15 STRL SS (BLADE) ×3
BURR OVAL 8 FLU 4.0X13 (MISCELLANEOUS) IMPLANT
CANNULA 5.75X71 LONG (CANNULA) IMPLANT
CANNULA PASSPORT 5 (CANNULA) IMPLANT
CANNULA PASSPORT BUTTON 10-40 (CANNULA) IMPLANT
CANNULA TWIST IN 8.25X7CM (CANNULA) IMPLANT
CHLORAPREP W/TINT 26 (MISCELLANEOUS) ×3 IMPLANT
CLSR STERI-STRIP ANTIMIC 1/2X4 (GAUZE/BANDAGES/DRESSINGS) ×3 IMPLANT
COOLER ICEMAN CLASSIC (MISCELLANEOUS) ×3 IMPLANT
DRAPE IMP U-DRAPE 54X76 (DRAPES) ×3 IMPLANT
DRAPE INCISE IOBAN 66X45 STRL (DRAPES) IMPLANT
DRAPE SHOULDER BEACH CHAIR (DRAPES) ×3 IMPLANT
DRSG PAD ABDOMINAL 8X10 ST (GAUZE/BANDAGES/DRESSINGS) ×3 IMPLANT
DW OUTFLOW CASSETTE/TUBE SET (MISCELLANEOUS) ×3 IMPLANT
ELECT REM PT RETURN 9FT ADLT (ELECTROSURGICAL) ×3
ELECTRODE REM PT RTRN 9FT ADLT (ELECTROSURGICAL) ×2 IMPLANT
GAUZE SPONGE 4X4 12PLY STRL (GAUZE/BANDAGES/DRESSINGS) ×3 IMPLANT
GLOVE SRG 8 PF TXTR STRL LF DI (GLOVE) ×2 IMPLANT
GLOVE SURG ENC MOIS LTX SZ6.5 (GLOVE) ×3 IMPLANT
GLOVE SURG LTX SZ8 (GLOVE) ×3 IMPLANT
GLOVE SURG UNDER POLY LF SZ6.5 (GLOVE) ×3 IMPLANT
GLOVE SURG UNDER POLY LF SZ8 (GLOVE) ×3
GOWN STRL REUS W/ TWL LRG LVL3 (GOWN DISPOSABLE) ×4 IMPLANT
GOWN STRL REUS W/TWL LRG LVL3 (GOWN DISPOSABLE) ×6
GOWN STRL REUS W/TWL XL LVL3 (GOWN DISPOSABLE) ×3 IMPLANT
KIT STABILIZATION SHOULDER (MISCELLANEOUS) ×3 IMPLANT
KIT STR SPEAR 1.8 FBRTK DISP (KITS) IMPLANT
LASSO CRESCENT QUICKPASS (SUTURE) IMPLANT
MANIFOLD NEPTUNE II (INSTRUMENTS) ×3 IMPLANT
NDL SAFETY ECLIPSE 18X1.5 (NEEDLE) ×2 IMPLANT
NEEDLE HYPO 18GX1.5 SHARP (NEEDLE) ×3
NEEDLE SCORPION MULTI FIRE (NEEDLE) IMPLANT
PACK ARTHROSCOPY DSU (CUSTOM PROCEDURE TRAY) ×3 IMPLANT
PACK BASIN DAY SURGERY FS (CUSTOM PROCEDURE TRAY) ×3 IMPLANT
PAD COLD SHLDR WRAP-ON (PAD) ×3 IMPLANT
PAD ORTHO SHOULDER 7X19 LRG (SOFTGOODS) ×3 IMPLANT
PENCIL SMOKE EVACUATOR (MISCELLANEOUS) ×3 IMPLANT
PORT APPOLLO RF 90DEGREE MULTI (SURGICAL WAND) ×3 IMPLANT
RESTRAINT HEAD UNIVERSAL NS (MISCELLANEOUS) ×3 IMPLANT
SHEET MEDIUM DRAPE 40X70 STRL (DRAPES) IMPLANT
SLEEVE SCD COMPRESS KNEE MED (STOCKING) ×3 IMPLANT
SLING ARM FOAM STRAP LRG (SOFTGOODS) IMPLANT
SPONGE T-LAP 18X18 ~~LOC~~+RFID (SPONGE) ×3 IMPLANT
SUPPORT WRAP ARM LG (MISCELLANEOUS) ×3 IMPLANT
SUT FIBERWIRE #2 38 T-5 BLUE (SUTURE)
SUT MNCRL AB 4-0 PS2 18 (SUTURE) ×3 IMPLANT
SUT PDS AB 1 CT  36 (SUTURE)
SUT PDS AB 1 CT 36 (SUTURE) IMPLANT
SUT TIGER TAPE 7 IN WHITE (SUTURE) IMPLANT
SUT VIC AB 0 CT1 27 (SUTURE) ×3
SUT VIC AB 0 CT1 27XBRD ANBCTR (SUTURE) ×2 IMPLANT
SUTURE FIBERWR #2 38 T-5 BLUE (SUTURE) IMPLANT
SUTURE TAPE TIGERLINK 1.3MM BL (SUTURE) IMPLANT
SUTURETAPE TIGERLINK 1.3MM BL (SUTURE)
SYR 5ML LL (SYRINGE) ×3 IMPLANT
SYSTEM TENDODESIS IMPLANT (Miscellaneous) ×3 IMPLANT
TAPE FIBER 2MM 7IN #2 BLUE (SUTURE) IMPLANT
TOWEL GREEN STERILE FF (TOWEL DISPOSABLE) ×6 IMPLANT
TUBE CONNECTING 20X1/4 (TUBING) ×3 IMPLANT
TUBE SUCTION HIGH CAP CLEAR NV (SUCTIONS) ×3 IMPLANT
TUBING ARTHROSCOPY IRRIG 16FT (MISCELLANEOUS) ×3 IMPLANT

## 2020-10-16 NOTE — Op Note (Signed)
Orthopaedic Surgery Operative Note (CSN: [id number])  Ryan Rhodes  10-25-1960 Date of Surgery: 10/16/2020   Diagnoses:  Left shoulder impingement, AC arthrosis, proximal biceps rupture  Procedure: Arthroscopic extensive debridement Arthroscopic subacromial decompression Arthroscopic distal clavicle excision Open subpectoral biceps tenodesis   Operative Finding Exam under anesthesia: Full motion no limitation Articular space: No loose bodies, capsule intact, l anterior labral fraying noted Chondral surfaces: Diffuse grade 2 and some areas of grade 3 changes on the humeral head as well as the glenoid Biceps: Traumatically ruptured, stump was debrided intra-articular.  The biceps that had retracted distally was relatively scarred in and thickened.  There was overall moderate to poor tissue quality.  We were able to reapproximate it somewhat though it was not as mobile as a normal biceps.  Clearly has a high risk of failure compared to a normal biceps. Subscapularis: Partial upper border fraying but the anchor of the subscapularis was intact. Superior Cuff: Normal Bursal side: Significant bursitis but no cuff pathology.  Successful completion of the planned procedure.  Patient's biceps tendon may have a high risk of failure than typical due to its chronicity and poor tissue quality.  That said the remainder the surgery went well.  His humeral head and glenoid had significant degenerative changes and he may require total shoulder arthroplasty in the future.   Post-operative plan: The patient will be non-weightbearing in a sling for 4 weeks with therapy to start immediately.  The patient will be discharged home.  DVT prophylaxis not indicated in ambulatory upper extremity patient without known risk factors.   Pain control with PRN pain medication preferring oral medicines.  Follow up plan will be scheduled in approximately 7 days for incision check and XR.  Post-Op Diagnosis:  Same Surgeons:Primary: Bjorn Pippin, MD Assistants:Caroline McBane PA-C Location: [facility] OR ROOM 6 Anesthesia: General with Exparel interscalene block Antibiotics: Ancef 2 g Tourniquet time: None Estimated Blood Loss: Minimal Complications: None Specimens: None Implants: Implant Name Type Inv. Item Serial No. Manufacturer Lot No. LRB No. Used Action  SYSTEM TENDODESIS IMPLANT - [id number] Miscellaneous SYSTEM [employer] [id number] Left 1 Implanted    Indications for Surgery:   Ryan Rhodes is a 60 y.o. male with continued shoulder pain refractory to nonoperative measures for extended period of time.    The risks and benefits were explained at length including but not limited to continued pain, cuff failure, biceps tenodesis failure, stiffness, need for further surgery and infection.   Procedure:   Patient was correctly identified in the preoperative holding area and operative site marked.  Patient brought to OR and positioned beachchair on an [place] table ensuring that all bony prominences were padded and the head was in an appropriate location.  Anesthesia was induced and the operative shoulder was prepped and draped in the usual sterile fashion.  Timeout was called preincision.  A longitudinal incision was made just distal to the pectoralis major insertion on the humerus in line with the biceps groove. We incised the skin and carefully dissected down to the level of the bicipital fascia. Fascia was identified and incised with a knife. Fasciotomy the length of our 2 cm incision.   The biceps was not immediately noted within the bicipital groove medial to the pectoralis.  We extend her incision about a half a centimeter distally and were able to dissect out and find the biceps which was scarred to the medial tissue.  We carefully dissected away and were able to  release the tendon.  It was clear that it ruptured and was already seeing some fibrotic changes of the muscle  head.  We were able to mobilize the long head and provide some excursion though it was not normal.  Were able to pull the tendon out of the incision and whipstitch it with a #2 fiber loop suture obtaining purchase of the muscular, musculotendinous and tendinous portion of the tendon.  We cleared a small area in the native path of the biceps medial to the pectoralis clearing periosteum so that we had good bony surface for healing.  We placed a 3.75 mm drill hole and an Arthrex metal button through which the sutures had been shuttled.  We are able to cinch the tissue down to bone and placed 1 limb of the suture through the tendon tying with alternating half hitches compressing bone to tendon.  A standard posterior viewing portal was made after localizing the portal with a spinal needle.  An anterior accessory portal was also made.  After clearing the articular space the camera was positioned in the subacromial space.  Findings above.    Extensive debridement was performed of the anterior interval tissue, labral fraying and the bursa.  Subacromial decompression: We made a lateral portal with spinal needle guidance. We then proceeded to debride bursal tissue extensively with a shaver and arthrocare device. At that point we continued to identify the borders of the acromion and identify the spur. We then carefully preserved the deltoid fascia and used a burr to convert the acromion to a Type 1 flat acromion without issue.   Distal Clavicle resection:  The scope was placed in the subacromial space from the posterior portal.  A hemostat was placed through the anterior portal and we spread at the [facility] joint.  A burr was then inserted and 10 mm of distal clavicle was resected taking care to avoid damage to the capsule around the joint and avoiding overhanging bone posteriorly.     The incisions were closed with absorbable monocryl and steri strips.  A sterile dressing was placed along with a sling. The patient  was awoken from general anesthesia and taken to the PACU in stable condition without complication.   Alfonse Alpers, PA-C, present and scrubbed throughout the case, critical for completion in a timely fashion, and for retraction, instrumentation, closure.

## 2020-10-16 NOTE — Transfer of Care (Signed)
Immediate Anesthesia Transfer of Care Note  Patient: Ryan Rhodes  Procedure(s) Performed: SHOULDER ARTHROSCOPY DEBRIDEMENT EXTENSIVE, SUBACROMIAL DECOMPRESSION PARTIAL ACROMIOPLASTY WITH CORACOACROMIAL RELEASE; ROTATOR CUFF REPAIR, AND OPEN BICEP TENODESIS (Left: Shoulder)  Patient Location: PACU  Anesthesia Type:GA combined with regional for post-op pain  Level of Consciousness: drowsy  Airway & Oxygen Therapy: Patient Spontanous Breathing and Patient connected to face mask oxygen  Post-op Assessment: Report given to RN and Post -op Vital signs reviewed and stable  Post vital signs: Reviewed and stable  Last Vitals:  Vitals Value Taken Time  BP 161/117 10/16/20 1045  Temp 36.5 C 10/16/20 1045  Pulse 79 10/16/20 1049  Resp 16 10/16/20 1049  SpO2 94 % 10/16/20 1049  Vitals shown include unvalidated device data.  Last Pain:  Vitals:   10/16/20 1045  TempSrc:   PainSc: Asleep         Complications: No notable events documented.

## 2020-10-16 NOTE — Anesthesia Procedure Notes (Signed)
Anesthesia Regional Block: Interscalene brachial plexus block   Pre-Anesthetic Checklist: , timeout performed,  Correct Patient, Correct Site, Correct Laterality,  Correct Procedure, Correct Position, site marked,  Risks and benefits discussed,  Surgical consent,  Pre-op evaluation,  At surgeon's request and post-op pain management  Laterality: Left  Prep: chloraprep       Needles:  Injection technique: Single-shot  Needle Type: Echogenic Stimulator Needle     Needle Length: 10cm  Needle Gauge: 20     Additional Needles:   Procedures:,,,, ultrasound used (permanent image in chart),,    Narrative:  Start time: 10/16/2020 8:40 AM End time: 10/16/2020 8:44 AM Injection made incrementally with aspirations every 5 mL.  Performed by: Personally  Anesthesiologist: Lucretia Kern, MD  Additional Notes: Standard monitors applied. Skin prepped. Good needle visualization with ultrasound. Injection made in 5cc increments with no resistance to injection. Patient tolerated the procedure well.

## 2020-10-16 NOTE — Anesthesia Preprocedure Evaluation (Signed)
Anesthesia Evaluation  Patient identified by MRN, date of birth, ID band Patient awake    Reviewed: Allergy & Precautions, NPO status , Patient's Chart, lab work & pertinent test results  History of Anesthesia Complications Negative for: history of anesthetic complications  Airway Mallampati: II  TM Distance: >3 FB Neck ROM: Full    Dental  (+) Dental Advisory Given, Missing   Pulmonary neg pulmonary ROS,    Pulmonary exam normal        Cardiovascular hypertension, Normal cardiovascular exam     Neuro/Psych negative neurological ROS     GI/Hepatic negative GI ROS, Neg liver ROS,   Endo/Other  diabetes, Type 2, Oral Hypoglycemic Agents  Renal/GU negative Renal ROS  negative genitourinary   Musculoskeletal  (+) Arthritis ,   Abdominal   Peds  Hematology negative hematology ROS (+)   Anesthesia Other Findings   Reproductive/Obstetrics                            Anesthesia Physical Anesthesia Plan  ASA: 2  Anesthesia Plan: General   Post-op Pain Management: GA combined w/ Regional for post-op pain   Induction: Intravenous  PONV Risk Score and Plan: Ondansetron, Dexamethasone, Treatment may vary due to age or medical condition and Midazolam  Airway Management Planned: Oral ETT  Additional Equipment: None  Intra-op Plan:   Post-operative Plan: Extubation in OR  Informed Consent: I have reviewed the patients History and Physical, chart, labs and discussed the procedure including the risks, benefits and alternatives for the proposed anesthesia with the patient or authorized representative who has indicated his/her understanding and acceptance.     Dental advisory given  Plan Discussed with:   Anesthesia Plan Comments:         Anesthesia Quick Evaluation

## 2020-10-16 NOTE — Interval H&P Note (Signed)
History and Physical Interval Note:  10/16/2020 8:36 AM  Ryan Rhodes  has presented today for surgery, with the diagnosis of LEFT SHOULDER IMPINGEMENT SYNDROME;ROTATOR CUFF TEAR.  The various methods of treatment have been discussed with the patient and family. After consideration of risks, benefits and other options for treatment, the patient has consented to  Procedure(s): SHOULDER ARTHROSCOPY DEBRIDEMENT EXTENSIVE, SUBACROMIAL DECOMPRESSION PARTIAL ACROMIOPLASTY WITH CORACOACROMIAL RELEASE; ROTATOR CUFF REPAIR, BICEP TENODESIS (Left) as a surgical intervention.  The patient's history has been reviewed, patient examined, no change in status, stable for surgery.  I have reviewed the patient's chart and labs.  Questions were answered to the patient's satisfaction.     Bjorn Pippin

## 2020-10-16 NOTE — Anesthesia Postprocedure Evaluation (Signed)
Anesthesia Post Note  Patient: ELISANDRO JARRETT  Procedure(s) Performed: SHOULDER ARTHROSCOPY DEBRIDEMENT EXTENSIVE, SUBACROMIAL DECOMPRESSION PARTIAL ACROMIOPLASTY WITH CORACOACROMIAL RELEASE; ROTATOR CUFF REPAIR, AND OPEN BICEP TENODESIS (Left: Shoulder)     Patient location during evaluation: PACU Anesthesia Type: General Level of consciousness: awake and alert Pain management: pain level controlled Vital Signs Assessment: post-procedure vital signs reviewed and stable Respiratory status: spontaneous breathing, nonlabored ventilation and respiratory function stable Cardiovascular status: blood pressure returned to baseline and stable Postop Assessment: no apparent nausea or vomiting Anesthetic complications: no   No notable events documented.  Last Vitals:  Vitals:   10/16/20 1145 10/16/20 1148  BP: (!) 147/105 (!) 144/108  Pulse: 79 82  Resp: 17 17  Temp:    SpO2: 93% 95%    Last Pain:  Vitals:   10/16/20 1145  TempSrc:   PainSc: 0-No pain                 Lucretia Kern

## 2020-10-16 NOTE — Progress Notes (Signed)
Assisted Dr. Witman with left, ultrasound guided, interscalene  block. Side rails up, monitors on throughout procedure. See vital signs in flow sheet. Tolerated Procedure well. 

## 2020-10-16 NOTE — Discharge Instructions (Addendum)
Ramond Marrow MD, MPH Alfonse Alpers, PA-C [facility] [address] [phone] (tel)   [phone] (fax)   POST-OPERATIVE INSTRUCTIONS - SHOULDER ARTHROSCOPY  WOUND CARE You may remove the Operative Dressing on Post-Op Day #3 (72hrs after surgery).   Alternatively if you would like you can leave dressing on until follow-up if within 7-8 days but keep it dry. Leave steri-strips in place until they fall off on their own, usually 2 weeks postop. There may be a small amount of fluid/bleeding leaking at the surgical site.  This is normal; the shoulder is filled with fluid during the procedure and can leak for 24-48hrs after surgery.  You may change/reinforce the bandage as needed.  Use the Cryocuff or Ice as often as possible for the first 7 days, then as needed for pain relief. Always keep a towel, ACE wrap or other barrier between the cooling unit and your skin.  You may shower on Post-Op Day #3. Gently pat the area dry. Do not soak the shoulder in water or submerge it. Keep incisions as dry as possible. Do not go swimming in the pool or ocean until 4 weeks after surgery or when otherwise instructed.    EXERCISES/BRACING Sling should be used at all times until follow-up.  You can remove sling for hygiene.    Please continue to ambulate and do not stay sitting or lying for too long. Perform foot and wrist pumps to assist in circulation.  POST-OP MEDICATIONS- Multimodal approach to pain control In general your pain will be controlled with a combination of substances.  Prescriptions unless otherwise discussed are electronically sent to your pharmacy.  This is a carefully made plan we use to minimize narcotic use.     Celebrex - Anti-inflammatory medication taken on a scheduled basis Acetaminophen - Non-narcotic pain medicine taken on a scheduled basis  Oxycodone - This is a strong narcotic, to be used only on an "as needed" basis for SEVERE pain. Robaxin -  This medicine is a muscle relaxer, take as needed for muscle spasms Zofran - take as needed for nausea   FOLLOW-UP If you develop a Fever (?101.5), Redness or Drainage from the surgical incision site, please call our office to arrange for an evaluation. Please call the office to schedule a follow-up appointment for your wound check, 10-14 days post-operatively.    HELPFUL INFORMATION  If you had a block, it will wear off between 8-24 hrs postop typically.  This is period when your pain may go from nearly zero to the pain you would have had postop without the block.  This is an abrupt transition but nothing dangerous is happening.  You may take an extra dose of narcotic when this happens.  You may be more comfortable sleeping in a semi-seated position the first few nights following surgery.  Keep a pillow propped under the elbow and forearm for comfort.  If you have a recliner type of chair it might be beneficial.  If not that is fine too, but it would be helpful to sleep propped up with pillows behind your operated shoulder as well under your elbow and forearm.  This will reduce pulling on the suture lines.  When dressing, put your operative arm in the sleeve first.  When getting undressed, take your operative arm out last.  Loose fitting, button-down shirts are recommended.  Often in the first days after surgery you may be more comfortable keeping your operative arm under your shirt and not  through the sleeve.  You may return to work/school in the next couple of days when you feel up to it.  Desk work and typing in the sling is fine.  We suggest you use the pain medication the first night prior to going to bed, in order to ease any pain when the anesthesia wears off. You should avoid taking pain medications on an empty stomach as it will make you nauseous.  You should wean off your narcotic medicines as soon as you are able.  Most patients will be off or using minimal narcotics before their  first postop appointment.   Do not drink alcoholic beverages or take illicit drugs when taking pain medications.  It is against the law to drive while taking narcotics.  In some states it is against the law to drive while your arm is in a sling.   Pain medication may make you constipated.  Below are a few solutions to try in this order: Decrease the amount of pain medication if you aren't having pain. Drink lots of decaffeinated fluids. Drink prune juice and/or eat dried prunes  If the first 3 don't work start with additional solutions Take Colace - an over-the-counter stool softener Take Senokot - an over-the-counter laxative Take Miralax - a stronger over-the-counter laxative  For more information including helpful videos and documents visit our website:   [email or web address]     Post Anesthesia Home Care Instructions  Activity: Get plenty of rest for the remainder of the day. A responsible individual must stay with you for 24 hours following the procedure.  For the next 24 hours, DO NOT: -Drive a car -Advertising copywriter -Drink alcoholic beverages -Take any medication unless instructed by your physician -Make any legal decisions or sign important papers.  Meals: Start with liquid foods such as gelatin or soup. Progress to regular foods as tolerated. Avoid greasy, spicy, heavy foods. If nausea and/or vomiting occur, drink only clear liquids until the nausea and/or vomiting subsides. Call your physician if vomiting continues.  Special Instructions/Symptoms: Your throat may feel dry or sore from the anesthesia or the breathing tube placed in your throat during surgery. If this causes discomfort, gargle with warm salt water. The discomfort should disappear within 24 hours.  If you had a scopolamine patch placed behind your ear for the management of post- operative nausea and/or vomiting:  1. The medication in the patch is effective for 72 hours,  after which it should be removed.  Wrap patch in a tissue and discard in the trash. Wash hands thoroughly with soap and water. 2. You may remove the patch earlier than 72 hours if you experience unpleasant side effects which may include dry mouth, dizziness or visual disturbances. 3. Avoid touching the patch. Wash your hands with soap and water after contact with the patch.    Regional Anesthesia Blocks  1. Numbness or the inability to move the "blocked" extremity may last from 3-48 hours after placement. The length of time depends on the medication injected and your individual response to the medication. If the numbness is not going away after 48 hours, call your surgeon.  2. The extremity that is blocked will need to be protected until the numbness is gone and the  Strength has returned. Because you cannot feel it, you will need to take extra care to avoid injury. Because it may be weak, you may have difficulty moving it or using it. You may not know what position it is in without looking at  it while the block is in effect.  3. For blocks in the legs and feet, returning to weight bearing and walking needs to be done carefully. You will need to wait until the numbness is entirely gone and the strength has returned. You should be able to move your leg and foot normally before you try and bear weight or walk. You will need someone to be with you when you first try to ensure you do not fall and possibly risk injury.  4. Bruising and tenderness at the needle site are common side effects and will resolve in a few days.  5. Persistent numbness or new problems with movement should be communicated to the surgeon or the [facility] [phone] [facility] [phone]).  Information for Discharge Teaching: EXPAREL (bupivacaine liposome injectable suspension)   Your surgeon or anesthesiologist gave you EXPAREL(bupivacaine) to help control your pain after surgery.  EXPAREL is a  local anesthetic that provides pain relief by numbing the tissue around the surgical site. EXPAREL is designed to release pain medication over time and can control pain for up to 72 hours. Depending on how you respond to EXPAREL, you may require less pain medication during your recovery.  Possible side effects: Temporary loss of sensation or ability to move in the area where bupivacaine was injected. Nausea, vomiting, constipation Rarely, numbness and tingling in your mouth or lips, lightheadedness, or anxiety may occur. Call your doctor right away if you think you may be experiencing any of these sensations, or if you have other questions regarding possible side effects.  Follow all other discharge instructions given to you by your surgeon or nurse. Eat a healthy diet and drink plenty of water or other fluids.  If you return to the hospital for any reason within 96 hours following the administration of EXPAREL, it is important for health care providers to know that you have received this anesthetic. A teal colored band has been placed on your arm with the date, time and amount of EXPAREL you have received in order to alert and inform your health care providers. Please leave this armband in place for the full 96 hours following administration, and then you may remove the band.

## 2020-10-16 NOTE — Anesthesia Procedure Notes (Signed)
Procedure Name: Intubation Date/Time: 10/16/2020 9:23 AM Performed by: Lavonia Dana, CRNA Pre-anesthesia Checklist: Patient identified, Emergency Drugs available, Suction available and Patient being monitored Patient Re-evaluated:Patient Re-evaluated prior to induction Oxygen Delivery Method: Circle system utilized Preoxygenation: Pre-oxygenation with 100% oxygen Induction Type: IV induction Ventilation: Mask ventilation without difficulty and Oral airway inserted - appropriate to patient size Laryngoscope Size: Mac and 4 Grade View: Grade I Tube type: Oral Tube size: 7.5 mm Number of attempts: 1 Airway Equipment and Method: Stylet and Oral airway Placement Confirmation: ETT inserted through vocal cords under direct vision, positive ETCO2 and breath sounds checked- equal and bilateral Secured at: 23 cm Tube secured with: Tape Dental Injury: Teeth and Oropharynx as per pre-operative assessment

## 2020-11-10 ENCOUNTER — Other Ambulatory Visit: Payer: Self-pay | Admitting: Family

## 2020-11-10 DIAGNOSIS — I10 Essential (primary) hypertension: Secondary | ICD-10-CM

## 2020-12-10 ENCOUNTER — Other Ambulatory Visit: Payer: Self-pay | Admitting: Neurological Surgery

## 2020-12-24 NOTE — Pre-Procedure Instructions (Signed)
Surgical Instructions   Your procedure is scheduled on Monday, October 3rd. Report to [facility] Main Entrance "A" at 08:30 A.M., then check in with the Admitting office. Call this number if you have problems the morning of surgery: [phone]   If you have any questions prior to your surgery date call [phone]: Open Monday-Friday 8am-4pm   Remember: Do not eat after midnight the night before your surgery  You may drink clear liquids until 07:30 AM the morning of your surgery.   Clear liquids allowed are: Water, Non-Citrus Juices (without pulp), Carbonated Beverages, Clear Tea, Black Coffee ONLY (NO MILK, CREAM OR POWDERED CREAMER of any kind), and Gatorade   Take these medicines the morning of surgery with A SIP OF WATER  allopurinol (ZYLOPRIM) methocarbamol (ROBAXIN)- if needed    WHAT DO I DO ABOUT MY DIABETES MEDICATION?   Do not take metFORMIN (GLUCOPHAGE-XR) the morning of surgery (10/3)  Do not take Dulaglutide (TRULICITY) the morning of surgery (10/3)       HOW TO MANAGE YOUR DIABETES BEFORE AND AFTER SURGERY  Why is it important to control my blood sugar before and after surgery? Improving blood sugar levels before and after surgery helps healing and can limit problems. A way of improving blood sugar control is eating a healthy diet by:  Eating less sugar and carbohydrates  Increasing activity/exercise  Talking with your doctor about reaching your blood sugar goals High blood sugars (greater than 180 mg/dL) can raise your risk of infections and slow your recovery, so you will need to focus on controlling your diabetes during the weeks before surgery. Make sure that the doctor who takes care of your diabetes knows about your planned surgery including the date and location.  How do I manage my blood sugar before surgery? Check your blood sugar at least 4 times a day, starting 2 days before surgery, to make sure that the level is not too high or low.  Check  your blood sugar the morning of your surgery when you wake up and every 2 hours until you get to the Short Stay unit.  If your blood sugar is less than 70 mg/dL, you will need to treat for low blood sugar: Do not take insulin. Treat a low blood sugar (less than 70 mg/dL) with  cup of clear juice (cranberry or apple), 4 glucose tablets, OR glucose gel. Recheck blood sugar in 15 minutes after treatment (to make sure it is greater than 70 mg/dL). If your blood sugar is not greater than 70 mg/dL on recheck, call [phone] for further instructions. Report your blood sugar to the short stay nurse when you get to Short Stay.  If you are admitted to the hospital after surgery: Your blood sugar will be checked by the staff and you will probably be given insulin after surgery (instead of oral diabetes medicines) to make sure you have good blood sugar levels. The goal for blood sugar control after surgery is 80-180 mg/dL.    As of today, STOP taking any Aspirin (unless otherwise instructed by your surgeon) Aleve, Naproxen, Ibuprofen, Motrin, Advil, Goody's, BC's, all herbal medications, fish oil, and all vitamins.                        Do NOT Smoke (Tobacco/Vaping)  24 hours prior to your procedure If you use a CPAP at night, you may bring your mask for your overnight stay.   Contacts, glasses, dentures or bridgework may  not be worn into surgery, please bring cases for these belongings   For patients admitted to the hospital, discharge time will be determined by your treatment team.   Patients discharged the day of surgery will not be allowed to drive home, and someone needs to stay with them for 24 hours.  NO VISITORS WILL BE ALLOWED IN PRE-OP WHERE PATIENTS GET READY FOR SURGERY.  ONLY 1 SUPPORT PERSON MAY BE PRESENT WHILE YOU ARE IN SURGERY.  IF YOU ARE TO BE ADMITTED, ONCE YOU ARE IN YOUR ROOM YOU WILL BE ALLOWED TWO (2) VISITORS.  Minor children may have two parents present. Special  consideration for safety and communication needs will be reviewed on a case by case basis.  Special instructions:    Oral Hygiene is also important to reduce your risk of infection.  Remember - BRUSH YOUR TEETH THE MORNING OF SURGERY WITH YOUR REGULAR TOOTHPASTE   [place]- Preparing For Surgery  Before surgery, you can play an important role. Because skin is not sterile, your skin needs to be as free of germs as possible. You can reduce the number of germs on your skin by washing with CHG (chlorahexidine gluconate) Soap before surgery.  CHG is an antiseptic cleaner which kills germs and bonds with the skin to continue killing germs even after washing.     Please do not use if you have an allergy to CHG or antibacterial soaps. If your skin becomes reddened/irritated stop using the CHG.  Do not shave (including legs and underarms) for at least 48 hours prior to first CHG shower. It is OK to shave your face.  Please follow these instructions carefully.     Shower the NIGHT BEFORE SURGERY and the MORNING OF SURGERY with CHG Soap.   If you chose to wash your hair, wash your hair first as usual with your normal shampoo. After you shampoo, rinse your hair and body thoroughly to remove the shampoo.  Then [employer] and genitals (private parts) with your normal soap and rinse thoroughly to remove soap.  After that Use CHG Soap as you would any other liquid soap. You can apply CHG directly to the skin and wash gently with a scrungie or a clean washcloth.   Apply the CHG Soap to your body ONLY FROM THE NECK DOWN.  Do not use on open wounds or open sores. Avoid contact with your eyes, ears, mouth and genitals (private parts). Wash Face and genitals (private parts)  with your normal soap.   Wash thoroughly, paying special attention to the area where your surgery will be performed.  Thoroughly rinse your body with warm water from the neck down.  DO NOT shower/wash with your normal soap after using  and rinsing off the CHG Soap.  Pat yourself dry with a CLEAN TOWEL.  Wear CLEAN PAJAMAS to bed the night before surgery  Place CLEAN SHEETS on your bed the night before your surgery  DO NOT SLEEP WITH PETS.   Day of Surgery:  Do not wear jewelry  Do not wear lotions, powders, colognes, or deodorant. Men may shave face and neck. Do not bring valuables to the hospital. [facility] is not responsible for any belongings or valuables. Take a shower with CHG soap. Wear Clean/Comfortable clothing the morning of surgery Remember to brush your teeth WITH YOUR REGULAR TOOTHPASTE.   Please read over the following fact sheets that you were given.

## 2020-12-25 ENCOUNTER — Encounter (HOSPITAL_COMMUNITY)
Admission: RE | Admit: 2020-12-25 | Discharge: 2020-12-25 | Disposition: A | Discharge status: Home / Self Care | Payer: 59 | Source: Ambulatory Visit | Attending: Neurological Surgery | Admitting: Neurological Surgery

## 2020-12-25 ENCOUNTER — Other Ambulatory Visit: Payer: Self-pay

## 2020-12-25 ENCOUNTER — Encounter (HOSPITAL_COMMUNITY): Payer: Self-pay

## 2020-12-25 DIAGNOSIS — Z01812 Encounter for preprocedural laboratory examination: Secondary | ICD-10-CM | POA: Insufficient documentation

## 2020-12-25 LAB — TYPE AND SCREEN
ABO/RH(D): O POS
Antibody Screen: NEGATIVE

## 2020-12-25 LAB — BASIC METABOLIC PANEL
Anion gap: 10 (ref 5–15)
BUN: 6 mg/dL (ref 6–20)
CO2: 25 mmol/L (ref 22–32)
Calcium: 10 mg/dL (ref 8.9–10.3)
Chloride: 102 mmol/L (ref 98–111)
Creatinine, Ser: 0.83 mg/dL (ref 0.61–1.24)
GFR, Estimated: >60 mL/min (ref >60)
Glucose, Bld: 102 mg/dL — ABNORMAL HIGH (ref 70–99)
Potassium: 4.4 mmol/L (ref 3.5–5.1)
Sodium: 137 mmol/L (ref 135–145)

## 2020-12-25 LAB — CBC
HCT: 48.1 % (ref 39.0–52.0)
Hemoglobin: 15.6 g/dL (ref 13.0–17.0)
MCH: 27.3 pg (ref 26.0–34.0)
MCHC: 32.4 g/dL (ref 30.0–36.0)
MCV: 84.1 fL (ref 80.0–100.0)
Platelets: 362 10*3/uL (ref 150–400)
RBC: 5.72 MIL/uL (ref 4.22–5.81)
RDW: 14.6 % (ref 11.5–15.5)
WBC: 5 10*3/uL (ref 4.0–10.5)
nRBC: 0 % (ref 0.0–0.2)

## 2020-12-25 LAB — SURGICAL PCR SCREEN
MRSA, PCR: NEGATIVE
Staphylococcus aureus: NEGATIVE

## 2020-12-25 LAB — GLUCOSE, CAPILLARY: Glucose-Capillary: 119 mg/dL — ABNORMAL HIGH (ref 70–99)

## 2020-12-25 LAB — HEMOGLOBIN A1C
Hgb A1c MFr Bld: 7.3 % — ABNORMAL HIGH (ref 4.8–5.6)
Mean Plasma Glucose: 162.81 mg/dL

## 2020-12-25 NOTE — Pre-Procedure Instructions (Signed)
Surgical Instructions   Your procedure is scheduled on Monday, October 3rd.  Report to [facility] Main Entrance "A" at 08:30 A.M., then check in with the Admitting office. Call this number if you have problems the morning of surgery: [phone]   If you have any questions prior to your surgery date call [phone]: Open Monday-Friday 8am-4pm   Remember: Do not eat after midnight the night before your surgery  You may drink clear liquids until 07:30 AM the morning of your surgery.   Clear liquids allowed are: Water, Non-Citrus Juices (without pulp), Carbonated Beverages, Clear Tea, Black Coffee ONLY (NO MILK, CREAM OR POWDERED CREAMER of any kind), and Gatorade   Take these medicines the morning of surgery with A SIP OF WATER   allopurinol (ZYLOPRIM) gabapentin (NEURONTIN)  methocarbamol (ROBAXIN)- If needed acetaminophen (TYLENOL)- If needed HYDROcodone-acetaminophen (NORCO)- If needed    WHAT DO I DO ABOUT MY DIABETES MEDICATION?   Do not take metFORMIN (GLUCOPHAGE-XR) the morning of surgery (10/3)  Do not take Dulaglutide (TRULICITY) the morning of surgery (10/3)       HOW TO MANAGE YOUR DIABETES BEFORE AND AFTER SURGERY  Why is it important to control my blood sugar before and after surgery? Improving blood sugar levels before and after surgery helps healing and can limit problems. A way of improving blood sugar control is eating a healthy diet by:  Eating less sugar and carbohydrates  Increasing activity/exercise  Talking with your doctor about reaching your blood sugar goals High blood sugars (greater than 180 mg/dL) can raise your risk of infections and slow your recovery, so you will need to focus on controlling your diabetes during the weeks before surgery. Make sure that the doctor who takes care of your diabetes knows about your planned surgery including the date and location.  How do I manage my blood sugar before surgery? Check your blood sugar at  least 4 times a day, starting 2 days before surgery, to make sure that the level is not too high or low.  Check your blood sugar the morning of your surgery when you wake up and every 2 hours until you get to the Short Stay unit.  If your blood sugar is less than 70 mg/dL, you will need to treat for low blood sugar: Do not take insulin. Treat a low blood sugar (less than 70 mg/dL) with  cup of clear juice (cranberry or apple), 4 glucose tablets, OR glucose gel. Recheck blood sugar in 15 minutes after treatment (to make sure it is greater than 70 mg/dL). If your blood sugar is not greater than 70 mg/dL on recheck, call [phone] for further instructions. Report your blood sugar to the short stay nurse when you get to Short Stay.  If you are admitted to the hospital after surgery: Your blood sugar will be checked by the staff and you will probably be given insulin after surgery (instead of oral diabetes medicines) to make sure you have good blood sugar levels. The goal for blood sugar control after surgery is 80-180 mg/dL.    As of today, STOP taking any Aspirin (unless otherwise instructed by your surgeon) Aleve, Naproxen, Ibuprofen, Motrin, Advil, Goody's, BC's, all herbal medications, fish oil, and all vitamins.                        Do NOT Smoke (Tobacco/Vaping)  24 hours prior to your procedure If you use a CPAP at night, you may bring your  mask for your overnight stay.   Contacts, glasses, dentures or bridgework may not be worn into surgery, please bring cases for these belongings   For patients admitted to the hospital, discharge time will be determined by your treatment team.   Patients discharged the day of surgery will not be allowed to drive home, and someone needs to stay with them for 24 hours.  NO VISITORS WILL BE ALLOWED IN PRE-OP WHERE PATIENTS GET READY FOR SURGERY.  ONLY 1 SUPPORT PERSON MAY BE PRESENT WHILE YOU ARE IN SURGERY.  IF YOU ARE TO BE ADMITTED, ONCE YOU  ARE IN YOUR ROOM YOU WILL BE ALLOWED TWO (2) VISITORS.  Minor children may have two parents present. Special consideration for safety and communication needs will be reviewed on a case by case basis.  Special instructions:    Oral Hygiene is also important to reduce your risk of infection.  Remember - BRUSH YOUR TEETH THE MORNING OF SURGERY WITH YOUR REGULAR TOOTHPASTE   [place]- Preparing For Surgery  Before surgery, you can play an important role. Because skin is not sterile, your skin needs to be as free of germs as possible. You can reduce the number of germs on your skin by washing with CHG (chlorahexidine gluconate) Soap before surgery.  CHG is an antiseptic cleaner which kills germs and bonds with the skin to continue killing germs even after washing.     Please do not use if you have an allergy to CHG or antibacterial soaps. If your skin becomes reddened/irritated stop using the CHG.  Do not shave (including legs and underarms) for at least 48 hours prior to first CHG shower. It is OK to shave your face.  Please follow these instructions carefully.     Shower the NIGHT BEFORE SURGERY and the MORNING OF SURGERY with CHG Soap.   If you chose to wash your hair, wash your hair first as usual with your normal shampoo. After you shampoo, rinse your hair and body thoroughly to remove the shampoo.  Then [employer] and genitals (private parts) with your normal soap and rinse thoroughly to remove soap.  After that Use CHG Soap as you would any other liquid soap. You can apply CHG directly to the skin and wash gently with a scrungie or a clean washcloth.   Apply the CHG Soap to your body ONLY FROM THE NECK DOWN.  Do not use on open wounds or open sores. Avoid contact with your eyes, ears, mouth and genitals (private parts). Wash Face and genitals (private parts)  with your normal soap.   Wash thoroughly, paying special attention to the area where your surgery will be performed.  Thoroughly  rinse your body with warm water from the neck down.  DO NOT shower/wash with your normal soap after using and rinsing off the CHG Soap.  Pat yourself dry with a CLEAN TOWEL.  Wear CLEAN PAJAMAS to bed the night before surgery  Place CLEAN SHEETS on your bed the night before your surgery  DO NOT SLEEP WITH PETS.   Day of Surgery:  Do not wear jewelry  Do not wear lotions, powders, colognes, or deodorant. Men may shave face and neck. Do not bring valuables to the hospital. [facility] is not responsible for any belongings or valuables. Take a shower with CHG soap. Wear Clean/Comfortable clothing the morning of surgery Remember to brush your teeth WITH YOUR REGULAR TOOTHPASTE.   Please read over the following fact sheets that you were  given.

## 2020-12-25 NOTE — Progress Notes (Signed)
PCP - Dr. Ria Clock at [facility] Cardiologist -  denies  PPM/ICD - n/a Device Orders - n/a Rep Notified - n/a  Chest x-ray - n/a EKG - 10/15/20 Stress Test - denies ECHO - denies Cardiac Cath - denies  Sleep Study - denies CPAP - n/a  CBG today- 119 Checks Blood Sugar once every 2-3 weeks  Blood Thinner Instructions: n/a Aspirin Instructions: n/a  ERAS Protcol - Yes PRE-SURGERY Ensure or G2- n/a  COVID TEST- Scheduled for 01/01/21. Patient is aware of time and location.    Anesthesia review: Yes. BP 150/106 at PAT appointment. Hetty Ely, PA made aware. Patient states he did not take his BP med today. States he takes it daily but just hasn't taken yet today. Patient states BP is normally 140's over 90's. Patient is aware to check BP daily and contact PCP if BP remains elevated leading up to surgery.   Patient denies shortness of breath, fever, cough and chest pain at PAT appointment   All instructions explained to the patient, with a verbal understanding of the material. Patient agrees to go over the instructions while at home for a better understanding. Patient also instructed to self quarantine after being tested for COVID-19. The opportunity to ask questions was provided.

## 2021-01-01 ENCOUNTER — Other Ambulatory Visit (HOSPITAL_COMMUNITY)
Admission: RE | Admit: 2021-01-01 | Discharge: 2021-01-01 | Disposition: A | Discharge status: Home / Self Care | Payer: 59 | Source: Ambulatory Visit | Attending: Neurological Surgery | Admitting: Neurological Surgery

## 2021-01-01 DIAGNOSIS — Z01812 Encounter for preprocedural laboratory examination: Secondary | ICD-10-CM | POA: Diagnosis not present

## 2021-01-01 DIAGNOSIS — Z20822 Contact with and (suspected) exposure to covid-19: Secondary | ICD-10-CM | POA: Diagnosis not present

## 2021-01-01 LAB — SARS CORONAVIRUS 2 (TAT 6-24 HRS): SARS Coronavirus 2: NEGATIVE

## 2021-01-01 NOTE — Progress Notes (Signed)
Patient has not arrived for covid test.  Attempted to reach patient but goes straight to voicemail.

## 2021-01-05 ENCOUNTER — Inpatient Hospital Stay (HOSPITAL_COMMUNITY)
Admission: RE | Admit: 2021-01-05 | Discharge: 2021-01-06 | DRG: 460 | Disposition: A | Discharge status: Home / Self Care | Payer: 59 | Attending: Neurological Surgery | Admitting: Neurological Surgery

## 2021-01-05 ENCOUNTER — Inpatient Hospital Stay (HOSPITAL_COMMUNITY): Payer: 59 | Admitting: Anesthesiology

## 2021-01-05 ENCOUNTER — Inpatient Hospital Stay (HOSPITAL_COMMUNITY): Payer: 59 | Admitting: Physician Assistant

## 2021-01-05 ENCOUNTER — Other Ambulatory Visit: Payer: Self-pay

## 2021-01-05 ENCOUNTER — Encounter (HOSPITAL_COMMUNITY)
Admission: RE | Disposition: A | Discharge status: Home / Self Care | Payer: Self-pay | Source: Home / Self Care | Attending: Neurological Surgery

## 2021-01-05 ENCOUNTER — Encounter (HOSPITAL_COMMUNITY): Payer: Self-pay | Admitting: Neurological Surgery

## 2021-01-05 ENCOUNTER — Inpatient Hospital Stay (HOSPITAL_COMMUNITY): Payer: 59

## 2021-01-05 DIAGNOSIS — M4807 Spinal stenosis, lumbosacral region: Secondary | ICD-10-CM | POA: Diagnosis present

## 2021-01-05 DIAGNOSIS — I10 Essential (primary) hypertension: Secondary | ICD-10-CM | POA: Diagnosis present

## 2021-01-05 DIAGNOSIS — Z419 Encounter for procedure for purposes other than remedying health state, unspecified: Secondary | ICD-10-CM

## 2021-01-05 DIAGNOSIS — Z79899 Other long term (current) drug therapy: Secondary | ICD-10-CM | POA: Diagnosis not present

## 2021-01-05 DIAGNOSIS — M5416 Radiculopathy, lumbar region: Secondary | ICD-10-CM | POA: Diagnosis present

## 2021-01-05 DIAGNOSIS — M109 Gout, unspecified: Secondary | ICD-10-CM | POA: Diagnosis present

## 2021-01-05 DIAGNOSIS — E119 Type 2 diabetes mellitus without complications: Secondary | ICD-10-CM | POA: Diagnosis present

## 2021-01-05 HISTORY — PX: TRANSFORAMINAL LUMBAR INTERBODY FUSION W/ MIS 2 LEVEL: SHX6146

## 2021-01-05 LAB — GLUCOSE, CAPILLARY
Glucose-Capillary: 148 mg/dL — ABNORMAL HIGH (ref 70–99)
Glucose-Capillary: 269 mg/dL — ABNORMAL HIGH (ref 70–99)

## 2021-01-05 LAB — ABO/RH: ABO/RH(D): O POS

## 2021-01-05 SURGERY — MINIMALLY INVASIVE (MIS) TRANSFORAMINAL LUMBAR INTERBODY FUSION (TLIF) 2 LEVEL
Anesthesia: General | Site: Spine Lumbar | Laterality: Right

## 2021-01-05 MED ORDER — ACETAMINOPHEN 325 MG PO TABS: 325.0000 mg | ORAL_TABLET | Freq: Once | ORAL | Status: DC | PRN

## 2021-01-05 MED ORDER — CEFAZOLIN SODIUM-DEXTROSE 2-4 GM/100ML-% IV SOLN
2.0000 g | INTRAVENOUS | Status: AC
  Administered 2021-01-05: 2 g via INTRAVENOUS

## 2021-01-05 MED ORDER — THROMBIN 5000 UNITS EX SOLR: OROMUCOSAL | Status: DC | PRN

## 2021-01-05 MED ORDER — DOCUSATE SODIUM 100 MG PO CAPS
100.0000 mg | ORAL_CAPSULE | Freq: Two times a day (BID) | ORAL | Status: DC
  Administered 2021-01-05 – 2021-01-06 (×2): 100 mg via ORAL
  Filled 2021-01-05 (×2): qty 1

## 2021-01-05 MED ORDER — POLYETHYLENE GLYCOL 3350 17 G PO PACK: 17.0000 g | PACK | Freq: Every day | ORAL | Status: DC | PRN

## 2021-01-05 MED ORDER — ONDANSETRON HCL 4 MG PO TABS: 4.0000 mg | ORAL_TABLET | Freq: Four times a day (QID) | ORAL | Status: DC | PRN

## 2021-01-05 MED ORDER — ACETAMINOPHEN 650 MG RE SUPP: 650.0000 mg | RECTAL | Status: DC | PRN

## 2021-01-05 MED ORDER — LABETALOL HCL 5 MG/ML IV SOLN
5.0000 mg | INTRAVENOUS | Status: AC | PRN
  Administered 2021-01-05 (×2): 5 mg via INTRAVENOUS

## 2021-01-05 MED ORDER — PHENYLEPHRINE HCL-NACL 20-0.9 MG/250ML-% IV SOLN
INTRAVENOUS | Status: DC | PRN
  Administered 2021-01-05: 25 ug/min via INTRAVENOUS

## 2021-01-05 MED ORDER — AMISULPRIDE (ANTIEMETIC) 5 MG/2ML IV SOLN: 10.0000 mg | Freq: Once | INTRAVENOUS | Status: DC | PRN

## 2021-01-05 MED ORDER — CHLORHEXIDINE GLUCONATE 0.12 % MT SOLN
OROMUCOSAL | Status: AC
  Administered 2021-01-05: 15 mL via OROMUCOSAL
  Filled 2021-01-05: qty 15

## 2021-01-05 MED ORDER — CEFAZOLIN SODIUM-DEXTROSE 2-4 GM/100ML-% IV SOLN
INTRAVENOUS | Status: AC
  Filled 2021-01-05: qty 100

## 2021-01-05 MED ORDER — ROCURONIUM BROMIDE 10 MG/ML (PF) SYRINGE
PREFILLED_SYRINGE | INTRAVENOUS | Status: AC
  Filled 2021-01-05: qty 10

## 2021-01-05 MED ORDER — ONDANSETRON HCL 4 MG/2ML IJ SOLN
INTRAMUSCULAR | Status: DC | PRN
  Administered 2021-01-05: 4 mg via INTRAVENOUS

## 2021-01-05 MED ORDER — DEXAMETHASONE SODIUM PHOSPHATE 10 MG/ML IJ SOLN
INTRAMUSCULAR | Status: DC | PRN
  Administered 2021-01-05: 4 mg via INTRAVENOUS

## 2021-01-05 MED ORDER — PROPOFOL 10 MG/ML IV BOLUS
INTRAVENOUS | Status: DC | PRN
  Administered 2021-01-05: 140 mg via INTRAVENOUS

## 2021-01-05 MED ORDER — CHLORHEXIDINE GLUCONATE CLOTH 2 % EX PADS: 6.0000 | MEDICATED_PAD | Freq: Once | CUTANEOUS | Status: DC

## 2021-01-05 MED ORDER — METFORMIN HCL ER 500 MG PO TB24
1000.0000 mg | ORAL_TABLET | Freq: Two times a day (BID) | ORAL | Status: DC
  Administered 2021-01-06: 1000 mg via ORAL
  Filled 2021-01-05: qty 2

## 2021-01-05 MED ORDER — HYDRALAZINE HCL 20 MG/ML IJ SOLN
10.0000 mg | Freq: Once | INTRAMUSCULAR | Status: AC
  Administered 2021-01-05: 10 mg via INTRAVENOUS

## 2021-01-05 MED ORDER — SUGAMMADEX SODIUM 200 MG/2ML IV SOLN
INTRAVENOUS | Status: DC | PRN
  Administered 2021-01-05: 100 mg via INTRAVENOUS

## 2021-01-05 MED ORDER — LABETALOL HCL 5 MG/ML IV SOLN
10.0000 mg | Freq: Once | INTRAVENOUS | Status: AC
  Administered 2021-01-05: 10 mg via INTRAVENOUS

## 2021-01-05 MED ORDER — OXYCODONE HCL 5 MG PO TABS
10.0000 mg | ORAL_TABLET | ORAL | Status: DC | PRN
  Administered 2021-01-05 – 2021-01-06 (×3): 10 mg via ORAL
  Filled 2021-01-05 (×3): qty 2

## 2021-01-05 MED ORDER — LACTATED RINGERS IV SOLN: INTRAVENOUS | Status: DC

## 2021-01-05 MED ORDER — ACETAMINOPHEN 10 MG/ML IV SOLN
INTRAVENOUS | Status: AC
  Filled 2021-01-05: qty 100

## 2021-01-05 MED ORDER — MIDAZOLAM HCL 2 MG/2ML IJ SOLN
INTRAMUSCULAR | Status: AC
  Filled 2021-01-05: qty 2

## 2021-01-05 MED ORDER — SODIUM CHLORIDE 0.9% FLUSH: 3.0000 mL | INTRAVENOUS | Status: DC | PRN

## 2021-01-05 MED ORDER — ENALAPRIL MALEATE 5 MG PO TABS
20.0000 mg | ORAL_TABLET | Freq: Every day | ORAL | Status: DC
  Administered 2021-01-05 – 2021-01-06 (×2): 20 mg via ORAL
  Filled 2021-01-05: qty 4
  Filled 2021-01-05: qty 1
  Filled 2021-01-05: qty 4

## 2021-01-05 MED ORDER — OXYCODONE HCL 5 MG PO TABS: 5.0000 mg | ORAL_TABLET | ORAL | Status: DC | PRN

## 2021-01-05 MED ORDER — FENTANYL CITRATE (PF) 250 MCG/5ML IJ SOLN
INTRAMUSCULAR | Status: AC
  Filled 2021-01-05: qty 5

## 2021-01-05 MED ORDER — ACETAMINOPHEN 160 MG/5ML PO SOLN: 325.0000 mg | Freq: Once | ORAL | Status: DC | PRN

## 2021-01-05 MED ORDER — LABETALOL HCL 5 MG/ML IV SOLN: 5.0000 mg | Freq: Once | INTRAVENOUS | Status: AC

## 2021-01-05 MED ORDER — ACETAMINOPHEN 325 MG PO TABS
650.0000 mg | ORAL_TABLET | ORAL | Status: DC | PRN
  Administered 2021-01-06: 650 mg via ORAL
  Filled 2021-01-05 (×2): qty 2

## 2021-01-05 MED ORDER — THROMBIN 5000 UNITS EX SOLR
CUTANEOUS | Status: AC
  Filled 2021-01-05: qty 5000

## 2021-01-05 MED ORDER — HYDRALAZINE HCL 20 MG/ML IJ SOLN
INTRAMUSCULAR | Status: AC
  Filled 2021-01-05: qty 1

## 2021-01-05 MED ORDER — LIDOCAINE-EPINEPHRINE 1 %-1:100000 IJ SOLN
INTRAMUSCULAR | Status: AC
  Filled 2021-01-05: qty 1

## 2021-01-05 MED ORDER — 0.9 % SODIUM CHLORIDE (POUR BTL) OPTIME
TOPICAL | Status: DC | PRN
  Administered 2021-01-05: 1000 mL

## 2021-01-05 MED ORDER — HYDROMORPHONE HCL 1 MG/ML IJ SOLN
INTRAMUSCULAR | Status: AC
  Filled 2021-01-05: qty 1

## 2021-01-05 MED ORDER — ATORVASTATIN CALCIUM 40 MG PO TABS
40.0000 mg | ORAL_TABLET | Freq: Every day | ORAL | Status: DC
  Administered 2021-01-05: 40 mg via ORAL
  Filled 2021-01-05: qty 1

## 2021-01-05 MED ORDER — HYDROMORPHONE HCL 1 MG/ML IJ SOLN
0.2500 mg | INTRAMUSCULAR | Status: DC | PRN
  Administered 2021-01-05 (×4): 0.5 mg via INTRAVENOUS

## 2021-01-05 MED ORDER — MEPERIDINE HCL 25 MG/ML IJ SOLN: 6.2500 mg | INTRAMUSCULAR | Status: DC | PRN

## 2021-01-05 MED ORDER — LIDOCAINE-EPINEPHRINE 1 %-1:100000 IJ SOLN
INTRAMUSCULAR | Status: DC | PRN
  Administered 2021-01-05: 20 mL

## 2021-01-05 MED ORDER — CHLORHEXIDINE GLUCONATE 0.12 % MT SOLN: 15.0000 mL | Freq: Once | OROMUCOSAL | Status: AC

## 2021-01-05 MED ORDER — CYCLOBENZAPRINE HCL 10 MG PO TABS
10.0000 mg | ORAL_TABLET | Freq: Three times a day (TID) | ORAL | Status: DC | PRN
  Administered 2021-01-05 – 2021-01-06 (×2): 10 mg via ORAL
  Filled 2021-01-05 (×2): qty 1

## 2021-01-05 MED ORDER — PHENYLEPHRINE 40 MCG/ML (10ML) SYRINGE FOR IV PUSH (FOR BLOOD PRESSURE SUPPORT)
PREFILLED_SYRINGE | INTRAVENOUS | Status: DC | PRN
  Administered 2021-01-05: 120 ug via INTRAVENOUS
  Administered 2021-01-05: 80 ug via INTRAVENOUS
  Administered 2021-01-05: 120 ug via INTRAVENOUS
  Administered 2021-01-05: 80 ug via INTRAVENOUS

## 2021-01-05 MED ORDER — LIDOCAINE 2% (20 MG/ML) 5 ML SYRINGE
INTRAMUSCULAR | Status: DC | PRN
  Administered 2021-01-05: 40 mg via INTRAVENOUS

## 2021-01-05 MED ORDER — SODIUM CHLORIDE 0.9% FLUSH: 3.0000 mL | Freq: Two times a day (BID) | INTRAVENOUS | Status: DC

## 2021-01-05 MED ORDER — CEFAZOLIN SODIUM-DEXTROSE 2-4 GM/100ML-% IV SOLN
2.0000 g | Freq: Three times a day (TID) | INTRAVENOUS | Status: AC
  Administered 2021-01-05 – 2021-01-06 (×2): 2 g via INTRAVENOUS
  Filled 2021-01-05 (×2): qty 100

## 2021-01-05 MED ORDER — ONDANSETRON HCL 4 MG/2ML IJ SOLN
4.0000 mg | Freq: Four times a day (QID) | INTRAMUSCULAR | Status: DC | PRN
  Filled 2021-01-05: qty 2

## 2021-01-05 MED ORDER — HYDROMORPHONE HCL 1 MG/ML IJ SOLN
1.0000 mg | INTRAMUSCULAR | Status: DC | PRN
  Administered 2021-01-05 – 2021-01-06 (×2): 1 mg via INTRAVENOUS
  Filled 2021-01-05 (×2): qty 1

## 2021-01-05 MED ORDER — LABETALOL HCL 5 MG/ML IV SOLN
INTRAVENOUS | Status: AC
  Administered 2021-01-05: 5 mg via INTRAVENOUS
  Filled 2021-01-05: qty 4

## 2021-01-05 MED ORDER — SODIUM CHLORIDE 0.9 % IV SOLN: 250.0000 mL | INTRAVENOUS | Status: DC

## 2021-01-05 MED ORDER — ALLOPURINOL 100 MG PO TABS
100.0000 mg | ORAL_TABLET | Freq: Every day | ORAL | Status: DC
  Administered 2021-01-06: 100 mg via ORAL
  Filled 2021-01-05: qty 1

## 2021-01-05 MED ORDER — ACETAMINOPHEN 10 MG/ML IV SOLN
INTRAVENOUS | Status: DC | PRN
  Administered 2021-01-05: 1000 mg via INTRAVENOUS

## 2021-01-05 MED ORDER — PROPOFOL 10 MG/ML IV BOLUS
INTRAVENOUS | Status: AC
  Filled 2021-01-05: qty 20

## 2021-01-05 MED ORDER — PROMETHAZINE HCL 25 MG/ML IJ SOLN: 6.2500 mg | INTRAMUSCULAR | Status: DC | PRN

## 2021-01-05 MED ORDER — MENTHOL 3 MG MT LOZG: 1.0000 | LOZENGE | OROMUCOSAL | Status: DC | PRN

## 2021-01-05 MED ORDER — ORAL CARE MOUTH RINSE: 15.0000 mL | Freq: Once | OROMUCOSAL | Status: AC

## 2021-01-05 MED ORDER — PHENOL 1.4 % MT LIQD: 1.0000 | OROMUCOSAL | Status: DC | PRN

## 2021-01-05 MED ORDER — ROCURONIUM BROMIDE 10 MG/ML (PF) SYRINGE
PREFILLED_SYRINGE | INTRAVENOUS | Status: DC | PRN
Start: 2021-01-05 — End: 2021-01-05
  Administered 2021-01-05: 10 mg via INTRAVENOUS
  Administered 2021-01-05: 60 mg via INTRAVENOUS
  Administered 2021-01-05 (×3): 20 mg via INTRAVENOUS

## 2021-01-05 MED ORDER — MIDAZOLAM HCL 5 MG/5ML IJ SOLN
INTRAMUSCULAR | Status: DC | PRN
  Administered 2021-01-05: 2 mg via INTRAVENOUS

## 2021-01-05 MED ORDER — FENTANYL CITRATE (PF) 250 MCG/5ML IJ SOLN
INTRAMUSCULAR | Status: DC | PRN
  Administered 2021-01-05 (×3): 50 ug via INTRAVENOUS
  Administered 2021-01-05: 100 ug via INTRAVENOUS
  Administered 2021-01-05 (×2): 50 ug via INTRAVENOUS

## 2021-01-05 MED ORDER — DEXAMETHASONE SODIUM PHOSPHATE 10 MG/ML IJ SOLN
INTRAMUSCULAR | Status: AC
  Filled 2021-01-05: qty 1

## 2021-01-05 MED ORDER — GABAPENTIN 300 MG PO CAPS
300.0000 mg | ORAL_CAPSULE | Freq: Three times a day (TID) | ORAL | Status: DC
  Administered 2021-01-05 – 2021-01-06 (×2): 300 mg via ORAL
  Filled 2021-01-05 (×2): qty 1

## 2021-01-05 MED ORDER — ONDANSETRON HCL 4 MG/2ML IJ SOLN
INTRAMUSCULAR | Status: AC
  Filled 2021-01-05: qty 2

## 2021-01-05 MED ORDER — LIDOCAINE 2% (20 MG/ML) 5 ML SYRINGE
INTRAMUSCULAR | Status: AC
  Filled 2021-01-05: qty 5

## 2021-01-05 MED ORDER — ACETAMINOPHEN 10 MG/ML IV SOLN: 1000.0000 mg | Freq: Once | INTRAVENOUS | Status: DC | PRN

## 2021-01-05 SURGICAL SUPPLY — 64 items
BAG COUNTER SPONGE SURGICOUNT (BAG) ×2 IMPLANT
BAND RUBBER #18 3X1/16 STRL (MISCELLANEOUS) ×4 IMPLANT
BASKET BONE COLLECTION (BASKET) ×2 IMPLANT
BLADE CLIPPER SURG (BLADE) IMPLANT
BLADE SURG 11 STRL SS (BLADE) ×2 IMPLANT
BUR MATCHSTICK NEURO 3.0 LAGG (BURR) IMPLANT
BUR PRECISION MATCH 3.0 13 (BURR) IMPLANT
BUR ROUND PRECISION 4.0 (BURR) ×2 IMPLANT
CAGE EXP CATALYFT 9 (Plate) ×4 IMPLANT
CNTNR URN SCR LID CUP LEK RST (MISCELLANEOUS) ×1 IMPLANT
CONT SPEC 4OZ STRL OR WHT (MISCELLANEOUS) ×2
COVER BACK TABLE 60X90IN (DRAPES) ×2 IMPLANT
DECANTER SPIKE VIAL GLASS SM (MISCELLANEOUS) ×2 IMPLANT
DERMABOND ADVANCED (GAUZE/BANDAGES/DRESSINGS) ×1
DERMABOND ADVANCED .7 DNX12 (GAUZE/BANDAGES/DRESSINGS) ×1 IMPLANT
DRAPE C-ARM 42X72 X-RAY (DRAPES) ×2 IMPLANT
DRAPE C-ARMOR (DRAPES) ×2 IMPLANT
DRAPE INCISE IOBAN 66X45 STRL (DRAPES) ×2 IMPLANT
DRAPE LAPAROTOMY 100X72X124 (DRAPES) ×2 IMPLANT
DRAPE MICROSCOPE LEICA (MISCELLANEOUS) ×2 IMPLANT
DRAPE SURG 17X23 STRL (DRAPES) ×4 IMPLANT
ELECT BLADE 6.5 EXT (BLADE) ×2 IMPLANT
ELECT REM PT RETURN 9FT ADLT (ELECTROSURGICAL) ×2
ELECTRODE REM PT RTRN 9FT ADLT (ELECTROSURGICAL) ×1 IMPLANT
EXTENDER TAB GUIDE SV 5.5/6.0 (INSTRUMENTS) ×24 IMPLANT
GAUZE 4X4 16PLY ~~LOC~~+RFID DBL (SPONGE) IMPLANT
GAUZE SPONGE 4X4 12PLY STRL (GAUZE/BANDAGES/DRESSINGS) ×2 IMPLANT
GLOVE SURG LTX SZ7.5 (GLOVE) ×2 IMPLANT
GLOVE SURG PR MICRO ENCORE 7.5 (GLOVE) ×4 IMPLANT
GLOVE SURG UNDER POLY LF SZ6.5 (GLOVE) ×2 IMPLANT
GLOVE SURG UNDER POLY LF SZ7 (GLOVE) ×4 IMPLANT
GLOVE SURG UNDER POLY LF SZ7.5 (GLOVE) ×2 IMPLANT
GOWN STRL REUS W/ TWL LRG LVL3 (GOWN DISPOSABLE) ×1 IMPLANT
GOWN STRL REUS W/ TWL XL LVL3 (GOWN DISPOSABLE) IMPLANT
GOWN STRL REUS W/TWL 2XL LVL3 (GOWN DISPOSABLE) IMPLANT
GOWN STRL REUS W/TWL LRG LVL3 (GOWN DISPOSABLE) ×2
GOWN STRL REUS W/TWL XL LVL3 (GOWN DISPOSABLE)
GUIDEWIRE BLUNT NT 450 (WIRE) ×12 IMPLANT
HEMOSTAT POWDER KIT SURGIFOAM (HEMOSTASIS) ×2 IMPLANT
KIT BASIN OR (CUSTOM PROCEDURE TRAY) ×2 IMPLANT
KIT POSITION SURG JACKSON T1 (MISCELLANEOUS) ×2 IMPLANT
KIT TURNOVER KIT B (KITS) IMPLANT
MARKER SKIN DUAL TIP RULER LAB (MISCELLANEOUS) ×2 IMPLANT
NEEDLE BEVEL TWO-PAK W/1PK (NEEDLE) ×2 IMPLANT
NEEDLE HYPO 18GX1.5 BLUNT FILL (NEEDLE) IMPLANT
NEEDLE HYPO 22GX1.5 SAFETY (NEEDLE) ×2 IMPLANT
NEEDLE SPNL 18GX3.5 QUINCKE PK (NEEDLE) IMPLANT
NS IRRIG 1000ML POUR BTL (IV SOLUTION) ×2 IMPLANT
PACK LAMINECTOMY NEURO (CUSTOM PROCEDURE TRAY) ×2 IMPLANT
PAD ARMBOARD 7.5X6 YLW CONV (MISCELLANEOUS) ×4 IMPLANT
ROD PERC CCM 5.5X70 (Rod) ×4 IMPLANT
SCREW MAS VOYAGER 6.5X35 (Screw) ×4 IMPLANT
SCREW MAS VOYAGER 6.5X40 (Screw) ×8 IMPLANT
SCREW SET 5.5/6.0MM SOLERA (Screw) ×12 IMPLANT
SPONGE T-LAP 4X18 ~~LOC~~+RFID (SPONGE) IMPLANT
SUT MNCRL AB 3-0 PS2 18 (SUTURE) ×2 IMPLANT
SUT VIC AB 0 CT1 18XCR BRD8 (SUTURE) IMPLANT
SUT VIC AB 0 CT1 8-18 (SUTURE)
SUT VIC AB 2-0 CP2 18 (SUTURE) ×4 IMPLANT
SYR 3ML LL SCALE MARK (SYRINGE) IMPLANT
TOWEL GREEN STERILE (TOWEL DISPOSABLE) ×2 IMPLANT
TOWEL GREEN STERILE FF (TOWEL DISPOSABLE) ×2 IMPLANT
TRAY FOLEY MTR SLVR 16FR STAT (SET/KITS/TRAYS/PACK) IMPLANT
WATER STERILE IRR 1000ML POUR (IV SOLUTION) ×2 IMPLANT

## 2021-01-05 NOTE — Transfer of Care (Signed)
Immediate Anesthesia Transfer of Care Note  Patient: Ryan Rhodes  Procedure(s) Performed: Right Lumbar four-five Lumbar five Sacral one Minimally invasive transforaminal lumbar interbody fusion (Right: Spine Lumbar)  Patient Location: PACU  Anesthesia Type:General  Level of Consciousness: drowsy and patient cooperative  Airway & Oxygen Therapy: Patient Spontanous Breathing and Patient connected to nasal cannula oxygen  Post-op Assessment: Report given to RN, Post -op Vital signs reviewed and stable and Patient moving all extremities  Post vital signs: Reviewed and stable  Last Vitals:  Vitals Value Taken Time  BP 156/99 01/05/21 1721  Temp    Pulse 107 01/05/21 1723  Resp 14 01/05/21 1723  SpO2 99 % 01/05/21 1723  Vitals shown include unvalidated device data.  Last Pain:  Vitals:   01/05/21 0833  TempSrc:   PainSc: 6       Patients Stated Pain Goal: 0 (01/05/21 6203)  Complications: No notable events documented.

## 2021-01-05 NOTE — Anesthesia Preprocedure Evaluation (Addendum)
Anesthesia Evaluation  Patient identified by MRN, date of birth, ID band Patient awake    Reviewed: Allergy & Precautions, NPO status , Patient's Chart, lab work & pertinent test results  Airway Mallampati: II  TM Distance: >3 FB Neck ROM: Full    Dental  (+) Missing, Poor Dentition, Chipped, Dental Advisory Given,    Pulmonary neg pulmonary ROS,    breath sounds clear to auscultation       Cardiovascular hypertension, Pt. on medications  Rhythm:Regular Rate:Normal     Neuro/Psych negative neurological ROS  negative psych ROS   GI/Hepatic negative GI ROS, Neg liver ROS,   Endo/Other  diabetes, Type 2, Oral Hypoglycemic Agents  Renal/GU negative Renal ROS     Musculoskeletal  (+) Arthritis ,   Abdominal Normal abdominal exam  (+)   Peds  Hematology negative hematology ROS (+)   Anesthesia Other Findings   Reproductive/Obstetrics                            Anesthesia Physical Anesthesia Plan  ASA: 2  Anesthesia Plan: General   Post-op Pain Management:    Induction: Intravenous  PONV Risk Score and Plan: 3 and Ondansetron, Dexamethasone and Midazolam  Airway Management Planned: Oral ETT  Additional Equipment: None  Intra-op Plan:   Post-operative Plan: Extubation in OR  Informed Consent: I have reviewed the patients History and Physical, chart, labs and discussed the procedure including the risks, benefits and alternatives for the proposed anesthesia with the patient or authorized representative who has indicated his/her understanding and acceptance.     Dental advisory given  Plan Discussed with: CRNA  Anesthesia Plan Comments: (Lab Results      Component                Value               Date                      WBC                      5.0                 12/25/2020                HGB                      15.6                12/25/2020                HCT                       48.1                12/25/2020                MCV                      84.1                12/25/2020                PLT                      362  12/25/2020           )       Anesthesia Quick Evaluation

## 2021-01-05 NOTE — Op Note (Signed)
PATIENT: Ryan Rhodes  DAY OF SURGERY: 01/05/21   PRE-OPERATIVE DIAGNOSIS:  Lumbar radiculopathy   POST-OPERATIVE DIAGNOSIS:  Same   PROCEDURE:  Right L4-L5, L5-S1 minimally invasive transforaminal lumbar interbody fusion with bilateral L4-L5, L5-S1 pedicle screw placement   SURGEON:  Surgeon(s) and Role:    Jadene Pierini, MD - Primary    Tressie Stalker, MD - Assisting   ANESTHESIA: ETGA   BRIEF HISTORY: This is a 60 year old man who presented with right lower extremity L5 radiculopathy due to a combination of lateral recess stenosis at L4-5 and foraminal stenosis at L5-S1. I therefore recommended MIS decompression of those areas and MIS TLIF. This was discussed with the patient as well as risks, benefits, and alternatives and wished to proceed with surgery.   OPERATIVE DETAIL:  The patient was taken to the operating room and placed on the OR table in the prone position. A formal time out was performed with two patient identifiers and confirmed the operative site. Anesthesia was induced by the anesthesia team. The operative site was marked, hair was clipped with surgical clippers, the area was then prepped and draped in a sterile fashion.   Fluoroscopy was used to localize the surgical level. The pedicles were marked and used to create skin incisions bilaterally. With fluoro guidance, Jamshidi needles were used to guide K-wires into the bilateral L4, L5 and S1 pedicles. The K wires were then secured with hemostats and attention turned to the TLIF.  A [employer] tube was then docked to the right L4-5 facet through the same incision using fluoroscopy. A right L4-5 facetectomy was performed and the right L4 nerve root was decompressed along its entire course. The tube was wanded medially and the decompression was continued medially until reaching the contralateral foramen. The tube was wanded back to the disc space. The disc space was identified, incised, and a discectomy was performed in  the standard fashion. The endplates were prepped, bone graft was packed into the disc space, and an expandable cage ([employer]) was packed with autograft and placed into the disc space with fluoroscopic confirmation. The tube was removed and hemostasis was obtained during its removal.   The same technique for MIS TLIF was then repeated on the right at L5-S1 in the same fashion. At this level, as expected, there was significant degenerative disease that caused severe foraminal stenosis and was decompressed.   Using the previously placed K wires, a tap and then screw with tower were placed bilaterally at L4, L5 and S1. A rod was sized and introduced on both sides, confirmed with fluoroscopy, then final tightened. Hemostasis was again confirmed for both incisions, they were copiously irrigated, and then closed in layers.    EBL:    DRAINS: none   SPECIMENS: none   Jadene Pierini, MD 01/05/21 5:17 PM

## 2021-01-05 NOTE — Anesthesia Procedure Notes (Signed)
Procedure Name: Intubation Date/Time: 01/05/2021 12:20 PM Performed by: Carolan Clines, CRNA Pre-anesthesia Checklist: Patient identified, Emergency Drugs available, Suction available and Patient being monitored Patient Re-evaluated:Patient Re-evaluated prior to induction Oxygen Delivery Method: Circle System Utilized Preoxygenation: Pre-oxygenation with 100% oxygen Induction Type: IV induction Ventilation: Mask ventilation without difficulty and Oral airway inserted - appropriate to patient size Laryngoscope Size: Mac and 4 Grade View: Grade I Tube type: Oral Tube size: 7.5 mm Number of attempts: 1 Airway Equipment and Method: Stylet and Oral airway Placement Confirmation: ETT inserted through vocal cords under direct vision, positive ETCO2 and breath sounds checked- equal and bilateral Secured at: 22 cm Tube secured with: Tape Dental Injury: Teeth and Oropharynx as per pre-operative assessment

## 2021-01-05 NOTE — Anesthesia Postprocedure Evaluation (Signed)
Anesthesia Post Note  Patient: Ryan Rhodes  Procedure(s) Performed: Right Lumbar four-five Lumbar five Sacral one Minimally invasive transforaminal lumbar interbody fusion (Right: Spine Lumbar)     Patient location during evaluation: SICU Anesthesia Type: General Level of consciousness: sedated Pain management: pain level controlled Vital Signs Assessment: post-procedure vital signs reviewed and stable Respiratory status: patient remains intubated per anesthesia plan Cardiovascular status: stable Postop Assessment: no apparent nausea or vomiting Anesthetic complications: no   No notable events documented.  Last Vitals:  Vitals:   01/05/21 1745 01/05/21 1805  BP:    Pulse: (!) 102 100  Resp: 20 15  Temp:    SpO2: 100% 100%    Last Pain:  Vitals:   01/05/21 1805  TempSrc:   PainSc: 7                  Shelton Silvas

## 2021-01-05 NOTE — Progress Notes (Signed)
Pacu RN Report to floor given  Gave report to [employer], rm [phone]. Discussed surgery, meds given in OR and Pacu, VS, IV fluids given, EBL, urine output, pain and other pertinent information. Also discussed if pt had any family or friends here or belongings with them.   Discussed Bp/diastolic pressure of 102-106 and Anesthesia ordered Labetalol 5mg  x2 IVP.   Pt exits my care.

## 2021-01-05 NOTE — H&P (Signed)
Surgical H&P Update  HPI: 60 y.o. man with right lower extremity pain and right foot weakness. No changes in health since he was last seen. Still having right lower extremity pain and numbness and wishes to proceed with surgery.  PMHx:  Past Medical History:  Diagnosis Date   Arthritis    gout   Diabetes mellitus ([place])    Gout    Gout    Hypertension    FamHx:  Family History  Problem Relation Age of Onset   Leukemia Mother    Lung cancer Father    Multiple myeloma Brother    Colon cancer Neg Hx    SocHx:  reports that he has never smoked. He has never used smokeless tobacco. He reports current alcohol use. He reports that he does not currently use drugs after having used the following drugs: Marijuana.  Physical Exam: AOx3, PERRL, FS, TM  Strength 5/5 x4 except right EHL/DF 4+/5, SILTx4 except right L5 numbness  Assesment/Plan: 60 y.o. man with R L5 pain/weakness/numbness, here for right L4-5 and L5-S1 MIS TLIF. Risks, benefits, and alternatives discussed and the patient would like to continue with surgery.  -OR today -3C post-op  Judith Part, MD 01/05/21 11:44 AM

## 2021-01-06 ENCOUNTER — Encounter (HOSPITAL_COMMUNITY): Payer: Self-pay | Admitting: Neurological Surgery

## 2021-01-06 LAB — GLUCOSE, CAPILLARY: Glucose-Capillary: 158 mg/dL — ABNORMAL HIGH (ref 70–99)

## 2021-01-06 MED ORDER — CYCLOBENZAPRINE HCL 10 MG PO TABS: 10.0000 mg | ORAL_TABLET | Freq: Three times a day (TID) | ORAL | 0 refills | Status: DC | PRN

## 2021-01-06 MED ORDER — OXYCODONE HCL 5 MG PO TABS: 5.0000 mg | ORAL_TABLET | ORAL | 0 refills | Status: DC | PRN

## 2021-01-06 NOTE — Discharge Summary (Signed)
Discharge Summary  Date of Admission: 01/05/2021  Date of Discharge: 01/06/21  Attending Physician: Autumn Patty, MD  Hospital Course: Patient was admitted following an uncomplicated 2 level MIS TLIF. He was recovered in PACU and transferred to [facility]. His preop pain was resolved immediately post-op, he did have some new LFCN numbness c/w meralgia paresthetica, but no radicular pain. His hospital course was uncomplicated and the patient was discharged home on 01/06/21. He will follow up in clinic with me in 2 weeks.  Neurologic exam at discharge:  Strength 5/5 x4, SILTx4 except R LFCN numbness  Discharge diagnosis: Lumbar radiculopathy  Jadene Pierini, MD 01/06/21 7:47 AM

## 2021-01-06 NOTE — Plan of Care (Signed)
Pt and wife given D/C instructions with verbal understanding. Rx's were sent to the pharmacy by MD. Pt's incision is clean and dry with no sign of infection. Pt's IV was removed prior to D/C. Pt D/C'd home via wheelchair per MD order. Pt is stable @ D/C and has no other needs at this time. Samanvi Cuccia, RN  

## 2021-01-06 NOTE — Progress Notes (Signed)
Neurosurgery Service Progress Note  Subjective: No acute events overnight, preop radicular pain gone, he's very happy, some back spasms, some new right LFCN numbness w/o dysesthesias / pain   Objective: Vitals:   01/05/21 1910 01/05/21 1927 01/05/21 2320 01/06/21 0348  BP: (!) 141/97 138/89 (!) 132/92 130/90  Pulse: 98 99 (!) 105 99  Resp: 17 18 20 20   Temp: 98.2 F (36.8 C) 98.2 F (36.8 C) 98.7 F (37.1 C) 99.5 F (37.5 C)  TempSrc:  Oral Oral Oral  SpO2: 98% 95% 97% 100%  Weight:      Height:        Physical Exam: Strength 5/5 x4, SILTx4 except R LFCN numbness  Assessment & Plan: 60 y.o. man s/p 2 level MIS TLIF, recovering well. Preop sx resolved, +new meralgia paresthetica.  -discharge home today  41  01/06/21 7:43 AM

## 2021-01-06 NOTE — Evaluation (Signed)
Occupational Therapy Evaluation Patient Details Name: Ryan Rhodes MRN: [id number] DOB: 17-Jun-1960 Today's Date: 01/06/2021   History of Present Illness Pt is pleasant 60 y/o M admitted to [facility] on 01/05/21 with RLE L5 radiculopathy. Pt is s/p TLIF on 01/05/21.   Clinical Impression   Pt presents with decreased balance and mobility with some reported numbness in R lateral thigh and tightness in R lower back. Pt primarily independent with ADLs, requiring Min A intermittently with LB dressing and supervision with functional mobility and functional transfers. Pt reports that his wife will be available for 24 hour assistance as needed up to a week after his return home. Pt educated on spinal precautions and compensatory strategies for ADLs, verbalizing/demonstrating good understanding. Pt demonstrated good safety awareness with functional activity. No further skilled OT needs at this time. Will sign off.     Recommendations for follow up therapy are one component of a multi-disciplinary discharge planning process, led by the attending physician.  Recommendations may be updated based on patient status, additional functional criteria and insurance authorization.   Follow Up Recommendations  No OT follow up    Equipment Recommendations  None recommended by OT    Recommendations for Other Services       Precautions / Restrictions Precautions Precautions: Back Precaution Booklet Issued: Yes (comment) Restrictions Weight Bearing Restrictions: No      Mobility Bed Mobility Overal bed mobility: Needs Assistance Bed Mobility: Rolling;Supine to Sit;Sit to Supine Rolling: Supervision   Supine to sit: Supervision Sit to supine: Supervision        Transfers                      Balance Overall balance assessment: Needs assistance Sitting-balance support: No upper extremity supported;Feet unsupported;Feet supported Sitting balance-Leahy Scale: Good Sitting balance - Comments: Pt  trialed sitting EOB with feet supported initially with Good balance. Pt reported that his bed at home was higher and then trialed with hospital bed raised to the height of his bed with feet unsupported with Good balance.   Standing balance support: No upper extremity supported Standing balance-Leahy Scale: Good                             ADL either performed or assessed with clinical judgement   ADL Overall ADL's : Needs assistance/impaired Eating/Feeding: Independent   Grooming: Independent   Upper Body Bathing: Independent   Lower Body Bathing: Modified independent Lower Body Bathing Details (indicate cue type and reason): Pt has long-handled sponge to bathe LB while maintaining precautions Upper Body Dressing : Independent   Lower Body Dressing: Minimal assistance;With caregiver independent assisting;Adhering to back precautions   Toilet Transfer: Independent;Regular Toilet;Grab bars   Toileting- Clothing Manipulation and Hygiene: Independent   Tub/ Shower Transfer: Supervision/safety;Grab bars;Walk-in shower;Adhering to back precautions   Functional mobility during ADLs: Supervision/safety       Vision   Vision Assessment?: No apparent visual deficits     Perception     Praxis      Pertinent Vitals/Pain Pain Assessment: Faces Faces Pain Scale: Hurts a little bit Pain Location: Back Pain Descriptors / Indicators: Aching;Numbness;Sore Pain Intervention(s): Monitored during session;Repositioned     Hand Dominance     Extremity/Trunk Assessment Upper Extremity Assessment Upper Extremity Assessment: Overall WFL for tasks assessed   Lower Extremity Assessment Lower Extremity Assessment: Overall WFL for tasks assessed   Cervical / Trunk Assessment Cervical / Trunk  Assessment: Other exceptions Cervical / Trunk Exceptions: s/p TLIF   Communication Communication Communication: No difficulties   Cognition Arousal/Alertness: Awake/alert Behavior  During Therapy: WFL for tasks assessed/performed Overall Cognitive Status: Within Functional Limits for tasks assessed                                     General Comments  VSS on RA    Exercises     Shoulder Instructions      Home Living Family/patient expects to be discharged to:: Private residence Living Arrangements: Spouse/significant other Available Help at Discharge: Family;Available 24 hours/day Type of Home: House       Home Layout: Two level;Bed/bath upstairs     Bathroom Shower/Tub: Producer, television/film/video: Standard     Home Equipment: Cane - single point;Grab bars - toilet;Grab bars - tub/shower          Prior Functioning/Environment Level of Independence: Independent                 OT Problem List: Impaired balance (sitting and/or standing)      OT Treatment/Interventions:      OT Goals(Current goals can be found in the care plan section) Acute Rehab OT Goals Patient Stated Goal: return home OT Goal Formulation: With patient  OT Frequency:     Barriers to D/C:            Co-evaluation              AM-PAC OT "6 Clicks" Daily Activity     Outcome Measure Help from another person eating meals?: None Help from another person taking care of personal grooming?: None Help from another person toileting, which includes using toliet, bedpan, or urinal?: None Help from another person bathing (including washing, rinsing, drying)?: A Little Help from another person to put on and taking off regular upper body clothing?: None Help from another person to put on and taking off regular lower body clothing?: A Little 6 Click Score: 22   End of Session Nurse Communication: Mobility status  Activity Tolerance: Patient tolerated treatment well Patient left: in bed;with call bell/phone within reach;with family/visitor present  OT Visit Diagnosis: Unsteadiness on feet (R26.81)                Time: 1610-9604 OT Time  Calculation (min): 32 min Charges:  OT General Charges $OT Visit: 1 Visit OT Evaluation $OT Eval Low Complexity: 1 Low OT Treatments $Self Care/Home Management : 8-22 mins  Kahlani Graber C, OT/L  Acute Rehab [phone]   Lenice Llamas 01/06/2021, 9:32 AM

## 2021-01-06 NOTE — Evaluation (Signed)
Physical Therapy Evaluation and Discharge Patient Details Name: Ryan Rhodes MRN: [id number] DOB: [birth year] Today's Date: 01/06/2021  History of Present Illness  Pt is a 60 y/o male who presents s/p L4-S1 TLIF on 01/05/21. PMH significant for DM, gout, HTN, bilateral carpal tunnel release, L foot surgery, R shoulder surgery.   Clinical Impression  Patient evaluated by Physical Therapy with no further acute PT needs identified. All education has been completed and the patient has no further questions. Pt was able to demonstrate transfers and ambulation with gross modified independence and no AD. Pt was educated on precautions, brace application/wearing schedule, appropriate activity progression, and car transfer. See below for any follow-up Physical Therapy or equipment needs. PT is signing off. Thank you for this referral.        Recommendations for follow up therapy are one component of a multi-disciplinary discharge planning process, led by the attending physician.  Recommendations may be updated based on patient status, additional functional criteria and insurance authorization.  Follow Up Recommendations No PT follow up;Supervision - Intermittent    Equipment Recommendations  None recommended by PT    Recommendations for Other Services       Precautions / Restrictions Precautions Precautions: Back;Fall Precaution Booklet Issued: Yes (comment) Precaution Comments: Reviewed handout and pt was cued for precautions during functional mobility. Restrictions Weight Bearing Restrictions: No      Mobility  Bed Mobility Overal bed mobility: Modified Independent Bed Mobility: Rolling;Sidelying to Sit;Sit to Sidelying Rolling: Supervision   Supine to sit: Supervision Sit to supine: Supervision   General bed mobility comments: HOB flat and rails lowered to simulate home environment. No assist required. Light cues for optimal log roll technique.    Transfers Overall transfer  level: Modified independent Equipment used: None             General transfer comment: Pt demonstrated good hand placement on seated surface for safety. No assist to power-up to full stand.  Ambulation/Gait Ambulation/Gait assistance: Modified independent (Device/Increase time) Gait Distance (Feet): 560 Feet Assistive device: None Gait Pattern/deviations: Step-through pattern;Decreased stride length Gait velocity: Decreased Gait velocity interpretation: 1.31 - 2.62 ft/sec, indicative of limited community ambulator General Gait Details: Pt demonstrating good posture throughout gait training. No assist required and no unsteadiness or LOB noted.  Stairs Stairs: Yes Stairs assistance: Modified independent (Device/Increase time) Stair Management: One rail Right;Step to pattern;Forwards Number of Stairs: 10 General stair comments: VC's for sequencing and general safety. No assist required. Pt with good control of ascent and descent.  Wheelchair Mobility    Modified Rankin (Stroke Patients Only)       Balance Overall balance assessment: Needs assistance Sitting-balance support: No upper extremity supported;Feet unsupported;Feet supported Sitting balance-Leahy Scale: Good Sitting balance - Comments: Pt trialed sitting EOB with feet supported initially with Good balance. Pt reported that his bed at home was higher and then trialed with hospital bed raised to the height of his bed with feet unsupported with Good balance.   Standing balance support: No upper extremity supported Standing balance-Leahy Scale: Good                               Pertinent Vitals/Pain Pain Assessment: Faces Faces Pain Scale: Hurts little more Pain Location: Back Pain Descriptors / Indicators: Sore;Operative site guarding;Discomfort Pain Intervention(s): Monitored during session;Limited activity within patient's tolerance;Repositioned    Home Living Family/patient expects to be  discharged to:: Private residence Living Arrangements:  Spouse/significant other Available Help at Discharge: Family;Available 24 hours/day Type of Home: House Home Access: Stairs to enter     Home Layout: Two level;Bed/bath upstairs Home Equipment: Cane - single point;Grab bars - toilet;Grab bars - tub/shower      Prior Function Level of Independence: Independent         Comments: 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 Lateral thigh numbness, wrapping around to medial lower leg numbness)    Cervical / Trunk Assessment Cervical / Trunk Assessment: Other exceptions Cervical / Trunk Exceptions: s/p surgery  Communication   Communication: No difficulties  Cognition Arousal/Alertness: Awake/alert Behavior During Therapy: WFL for tasks assessed/performed Overall Cognitive Status: Within Functional Limits for tasks assessed                                        General Comments General comments (skin integrity, edema, etc.): VSS on RA    Exercises     Assessment/Plan    PT Assessment Patent does not need any further PT services  PT Problem List         PT Treatment Interventions      PT Goals (Current goals can be found in the Care Plan section)  Acute Rehab PT Goals Patient Stated Goal: Eventually return to work PT Goal Formulation: All assessment and education complete, DC therapy    Frequency     Barriers to discharge        Co-evaluation               AM-PAC PT "6 Clicks" Mobility  Outcome Measure Help needed turning from your back to your side while in a flat bed without using bedrails?: None Help  needed moving from lying on your back to sitting on the side of a flat bed without using bedrails?: None Help needed moving to and from a bed to a chair (including a wheelchair)?: None Help needed standing up from a chair using your arms (e.g., wheelchair or bedside chair)?: None Help needed to walk in hospital room?: None Help needed climbing 3-5 steps with a railing? : None 6 Click Score: 24    End of Session   Activity Tolerance: Patient tolerated treatment well Patient left: in bed;with call bell/phone within reach;with family/visitor present Nurse Communication: Mobility status PT Visit Diagnosis: Unsteadiness on feet (R26.81);Pain    Time: 7353-2992 PT Time Calculation (min) (ACUTE ONLY): 17 min   Charges:   PT Evaluation $PT Eval Low Complexity: 1 Low          Conni Slipper, PT, DPT Acute Rehabilitation Services Pager: [phone] Office: [phone]   Marylynn Pearson 01/06/2021, 11:32 AM

## 2021-01-08 ENCOUNTER — Telehealth: Payer: Self-pay

## 2021-01-08 NOTE — Telephone Encounter (Signed)
Transition Care Management Unsuccessful Follow-up Telephone Call  Date of discharge and from where:  [facility] 01/06/21  Attempts:  1st Attempt  Reason for unsuccessful TCM follow-up call:  Unable to leave message   Jodelle Gross, RN, BSN, CCM Care Management Coordinator [facility] Phone: [phone] / Fax: [phone]

## 2021-01-09 ENCOUNTER — Telehealth: Payer: Self-pay

## 2021-01-09 NOTE — Telephone Encounter (Signed)
Transition Care Management Unsuccessful Follow-up Telephone Call  Date of discharge and from where:  01/06/2021  [facility]  Attempts:  2nd Attempt  Reason for unsuccessful TCM follow-up call:  No answer/busy  Rowe Pavy, RN, BSN, CEN [facility] Coordinator [phone]

## 2021-02-23 ENCOUNTER — Telehealth: Payer: Self-pay | Admitting: Family

## 2021-02-23 NOTE — Telephone Encounter (Signed)
Pt called and stated that pharmacy stated this prescription needs to be updated. The rx states he takes one pill a day when he takes 2 so that way he can get a refill.  enalapril (VASOTEC) 20 MG tablet    [facility] [phone] - [place], [place] - [address] AT [facility] [address]  [address] [place] [address]  Phone:  [phone]  Fax:  [phone]

## 2021-02-24 ENCOUNTER — Other Ambulatory Visit: Payer: Self-pay | Admitting: Family

## 2021-02-24 DIAGNOSIS — I10 Essential (primary) hypertension: Secondary | ICD-10-CM

## 2021-02-24 MED ORDER — ENALAPRIL MALEATE 20 MG PO TABS: 20.0000 mg | ORAL_TABLET | Freq: Two times a day (BID) | ORAL | 1 refills | Status: DC

## 2021-03-06 ENCOUNTER — Ambulatory Visit: Payer: 59 | Admitting: Family

## 2021-03-10 ENCOUNTER — Ambulatory Visit: Payer: 59 | Admitting: Family

## 2021-03-11 ENCOUNTER — Other Ambulatory Visit: Payer: Self-pay | Admitting: Neurological Surgery

## 2021-03-11 DIAGNOSIS — M5416 Radiculopathy, lumbar region: Secondary | ICD-10-CM

## 2021-03-13 ENCOUNTER — Ambulatory Visit (INDEPENDENT_AMBULATORY_CARE_PROVIDER_SITE_OTHER): Payer: 59 | Admitting: Family

## 2021-03-13 ENCOUNTER — Encounter: Payer: Self-pay | Admitting: Family

## 2021-03-13 VITALS — BP 132/100 | HR 108 | Temp 98.3°F | Ht 66.0 in | Wt 174.4 lb

## 2021-03-13 DIAGNOSIS — I10 Essential (primary) hypertension: Secondary | ICD-10-CM | POA: Diagnosis not present

## 2021-03-13 DIAGNOSIS — Z23 Encounter for immunization: Secondary | ICD-10-CM | POA: Diagnosis not present

## 2021-03-13 DIAGNOSIS — E119 Type 2 diabetes mellitus without complications: Secondary | ICD-10-CM | POA: Diagnosis not present

## 2021-03-13 DIAGNOSIS — G629 Polyneuropathy, unspecified: Secondary | ICD-10-CM

## 2021-03-13 MED ORDER — FREESTYLE LIBRE 2 SENSOR MISC: 6 refills | Status: DC

## 2021-03-13 MED ORDER — GABAPENTIN 600 MG PO TABS: 600.0000 mg | ORAL_TABLET | Freq: Three times a day (TID) | ORAL | 0 refills | Status: DC

## 2021-03-13 MED ORDER — FREESTYLE LIBRE 2 READER DEVI: 6 refills | Status: DC
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
## 2021-03-14 LAB — HEMOGLOBIN A1C
Hgb A1c MFr Bld: 7.4 % of total Hgb — ABNORMAL HIGH (ref <5.7)
Mean Plasma Glucose: 166 mg/dL
eAG (mmol/L): 9.2 mmol/L

## 2021-03-14 LAB — COMPREHENSIVE METABOLIC PANEL
AG Ratio: 1.3 (calc) (ref 1.0–2.5)
ALT: 16 U/L (ref 9–46)
AST: 22 U/L (ref 10–35)
Albumin: 4.8 g/dL (ref 3.6–5.1)
Alkaline phosphatase (APISO): 82 U/L (ref 35–144)
BUN: 12 mg/dL (ref 7–25)
CO2: 25 mmol/L (ref 20–32)
Calcium: 10.8 mg/dL — ABNORMAL HIGH (ref 8.6–10.3)
Chloride: 101 mmol/L (ref 98–110)
Creat: 0.77 mg/dL (ref 0.70–1.30)
Globulin: 3.8 g/dL (calc) — ABNORMAL HIGH (ref 1.9–3.7)
Glucose, Bld: 120 mg/dL — ABNORMAL HIGH (ref 65–99)
Potassium: 5 mmol/L (ref 3.5–5.3)
Sodium: 139 mmol/L (ref 135–146)
Total Bilirubin: 0.3 mg/dL (ref 0.2–1.2)
Total Protein: 8.6 g/dL — ABNORMAL HIGH (ref 6.1–8.1)

## 2021-03-14 LAB — CBC WITH DIFFERENTIAL/PLATELET
Absolute Monocytes: 844 cells/uL (ref 200–950)
Basophils Absolute: 69 cells/uL (ref 0–200)
Basophils Relative: 1.1 %
Eosinophils Absolute: 69 cells/uL (ref 15–500)
Eosinophils Relative: 1.1 %
HCT: 47.2 % (ref 38.5–50.0)
Hemoglobin: 14.8 g/dL (ref 13.2–17.1)
Lymphs Abs: 1733 cells/uL (ref 850–3900)
MCH: 26 pg — ABNORMAL LOW (ref 27.0–33.0)
MCHC: 31.4 g/dL — ABNORMAL LOW (ref 32.0–36.0)
MCV: 82.8 fL (ref 80.0–100.0)
MPV: 9.2 fL (ref 7.5–12.5)
Monocytes Relative: 13.4 %
Neutro Abs: 3585 cells/uL (ref 1500–7800)
Neutrophils Relative %: 56.9 %
Platelets: 440 10*3/uL — ABNORMAL HIGH (ref 140–400)
RBC: 5.7 10*6/uL (ref 4.20–5.80)
RDW: 13.1 % (ref 11.0–15.0)
Total Lymphocyte: 27.5 %
WBC: 6.3 10*3/uL (ref 3.8–10.8)

## 2021-03-17 ENCOUNTER — Ambulatory Visit
Admission: RE | Admit: 2021-03-17 | Discharge: 2021-03-17 | Disposition: A | Discharge status: Home / Self Care | Payer: 59 | Source: Ambulatory Visit | Attending: Neurological Surgery | Admitting: Neurological Surgery

## 2021-03-17 ENCOUNTER — Other Ambulatory Visit: Payer: Self-pay

## 2021-03-17 DIAGNOSIS — M5416 Radiculopathy, lumbar region: Secondary | ICD-10-CM

## 2021-03-24 ENCOUNTER — Telehealth: Payer: Self-pay | Admitting: Family

## 2021-03-24 NOTE — Telephone Encounter (Signed)
Pt was requesting original out of work note, for disability paperwork.

## 2021-03-24 NOTE — Telephone Encounter (Signed)
I have called pt back and informed him that information is on my-chart. He stated understanding and will let us know if he can not find it.

## 2021-04-02 ENCOUNTER — Other Ambulatory Visit: Payer: 59

## 2021-05-08 ENCOUNTER — Ambulatory Visit: Payer: 59 | Admitting: Family

## 2021-05-15 ENCOUNTER — Ambulatory Visit: Payer: 59 | Admitting: Family

## 2021-06-02 IMAGING — DX DG SHOULDER 2+V*L*
3 series · 3 of 3 positions shown · non-contrast
Comparison: 03/17/2020

CLINICAL DATA: Left shoulder pain x3 weeks

EXAM:
LEFT SHOULDER - 2+ VIEW

[shoulder axial]
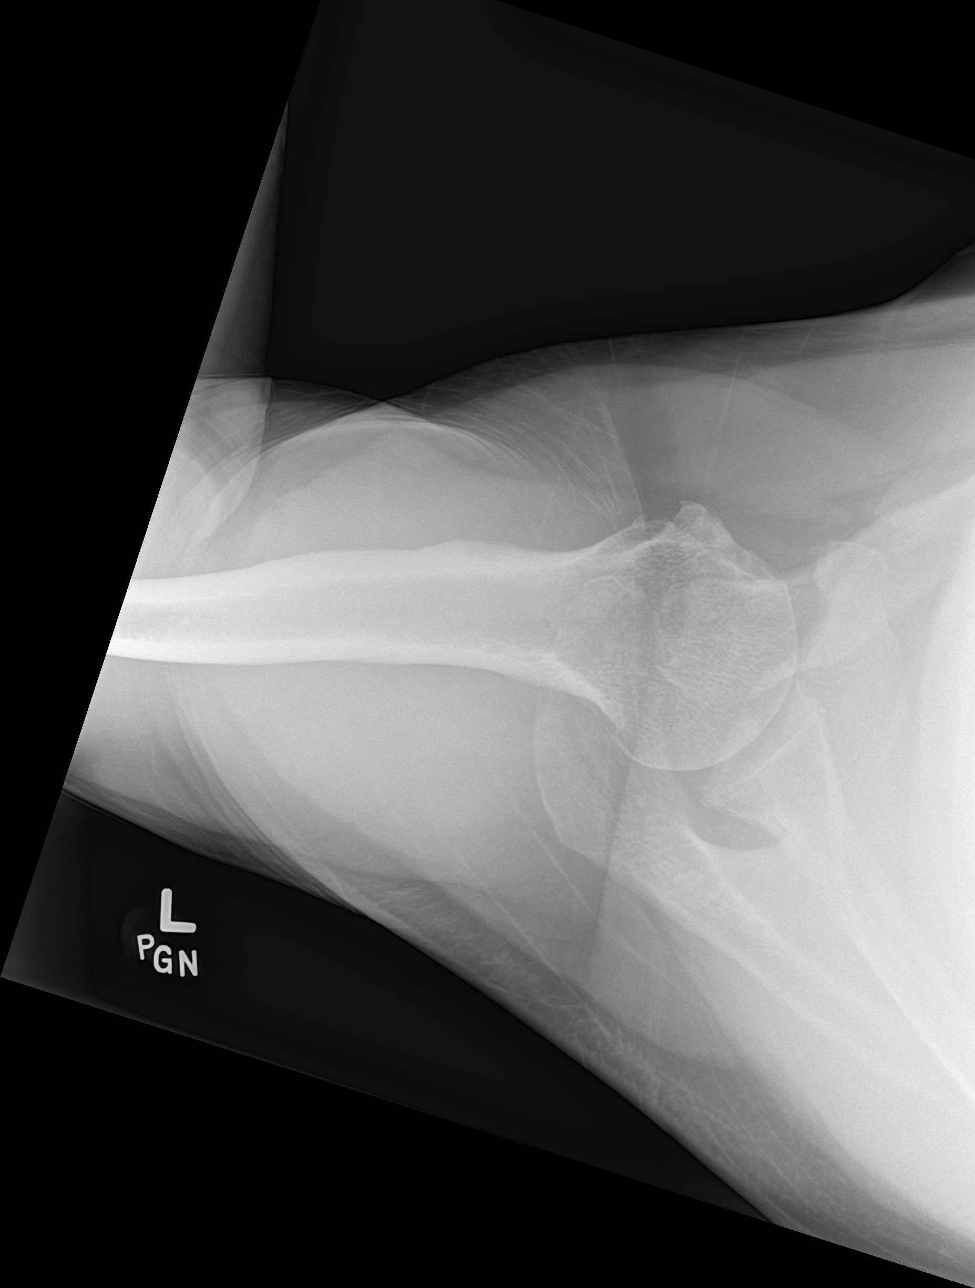

[shoulder ap (1 of 2)]
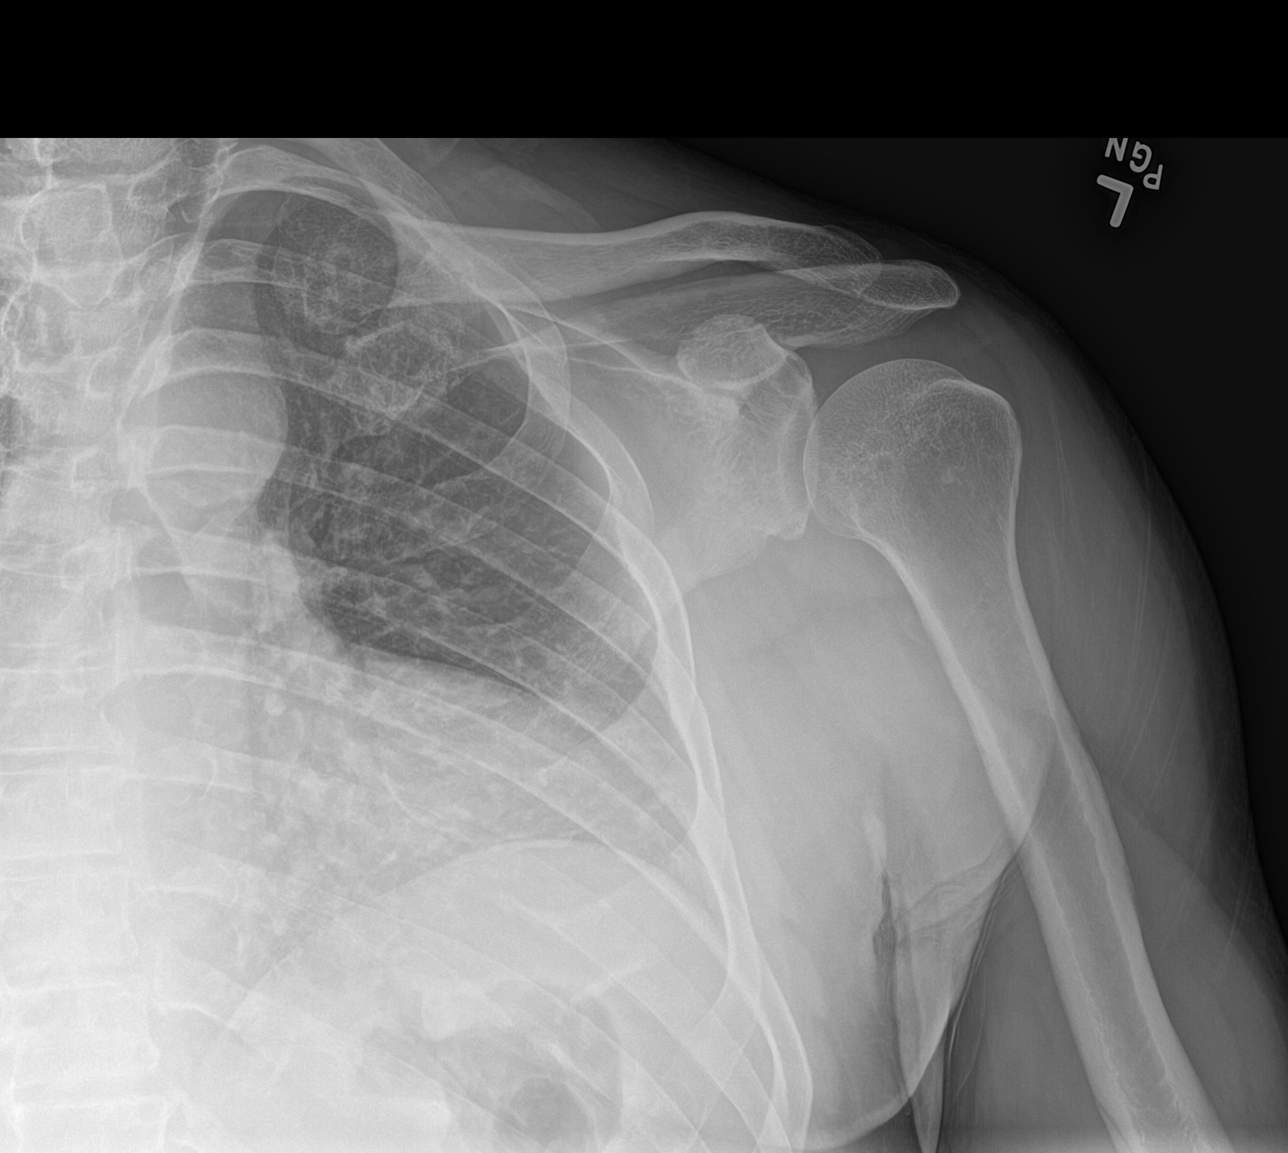

[shoulder ap (2 of 2)]
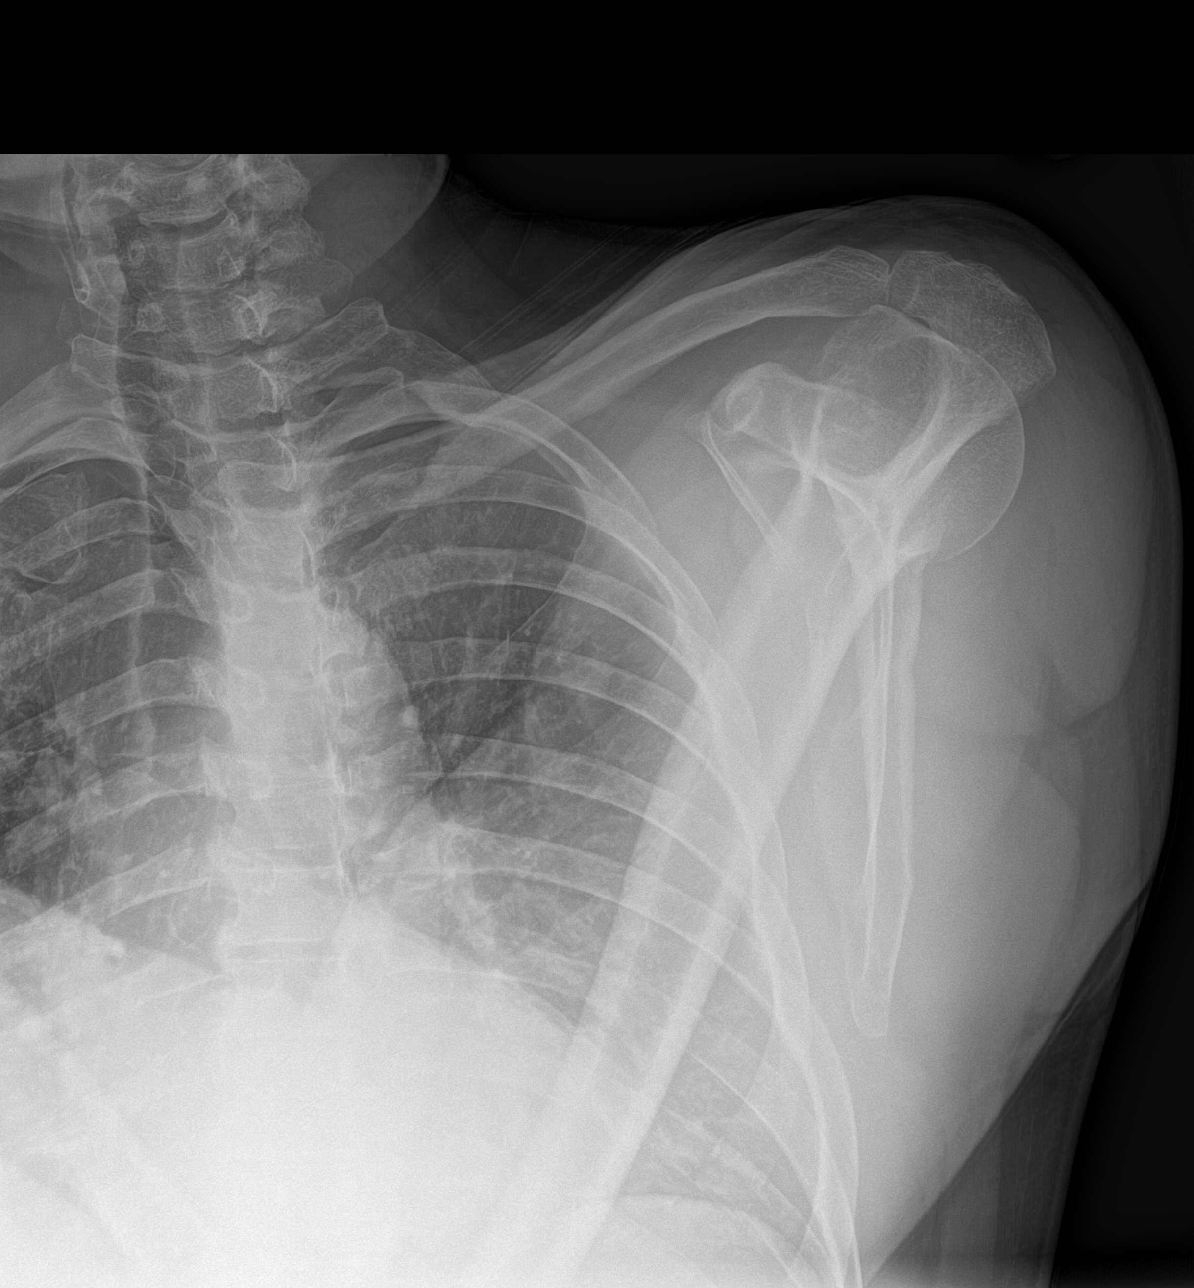

[3 of 3 positions shown; findings below may reference images not displayed]

FINDINGS: No fracture or dislocation is seen.

The joint spaces are preserved.

Visualized soft tissues are within normal limits.
IMPRESSION: Negative.

## 2021-06-12 ENCOUNTER — Ambulatory Visit: Payer: 59 | Admitting: Family

## 2021-06-18 ENCOUNTER — Other Ambulatory Visit: Payer: Self-pay | Admitting: Family

## 2021-06-18 ENCOUNTER — Telehealth: Payer: Self-pay

## 2021-06-18 MED ORDER — TRULICITY 3 MG/0.5ML ~~LOC~~ SOAJ: 3.0000 mg | SUBCUTANEOUS | 2 refills | Status: DC

## 2021-06-18 NOTE — Telephone Encounter (Signed)
Pt requesting refill on trulicity.  Pharmacy is [facility] [address]. ?

## 2021-06-18 NOTE — Telephone Encounter (Signed)
I have called the pt and relayed the message. He is now scheduled for April 21 at 120. ?

## 2021-06-24 ENCOUNTER — Ambulatory Visit (INDEPENDENT_AMBULATORY_CARE_PROVIDER_SITE_OTHER): Payer: 59 | Admitting: Family Medicine

## 2021-06-24 ENCOUNTER — Encounter: Payer: Self-pay | Admitting: Family Medicine

## 2021-06-24 VITALS — BP 124/84 | HR 95 | Temp 98.1°F | Resp 16 | Ht 65.0 in | Wt 178.6 lb

## 2021-06-24 DIAGNOSIS — L0292 Furuncle, unspecified: Secondary | ICD-10-CM

## 2021-06-24 MED ORDER — DOXYCYCLINE HYCLATE 100 MG PO TABS
100.0000 mg | ORAL_TABLET | Freq: Two times a day (BID) | ORAL | 0 refills | Status: AC
Start: 2021-06-24 — End: 2021-07-01

## 2021-06-24 NOTE — Patient Instructions (Signed)
When you do wash it, use only soap and water. Do not vigorously scrub. Keep the area clean and dry.  ? ?Things to look out for: increasing pain not relieved by ibuprofen/acetaminophen, fevers, spreading redness, drainage of pus, or foul odor. ? ?Ice/cold pack over area for 10-15 min twice daily. ? ?OK to take Tylenol 1000 mg (2 extra strength tabs) or 975 mg (3 regular strength tabs) every 6 hours as needed. ? ?Heat (pad or rice pillow in microwave) over affected area, 10-15 minutes twice daily.  ? ?Ibuprofen 400-600 mg (2-3 over the counter strength tabs) every 6 hours as needed for pain. ? ?Let us know if you need anything. ?

## 2021-06-24 NOTE — Progress Notes (Signed)
Chief Complaint  ?Patient presents with  ? Insect Bite  ?  Here for insect bite onset 3 days  ? ? ?Ryan Rhodes is a 61 y.o. male here for a skin complaint. ? ?Duration: 2 days ?Location: R side of face ?Pruritic? No ?Painful? Yes; slight ?Drainage? Yes; a little ?Fevers? No ?Other associated symptoms: swelling, redness ?Therapies tried thus far: ibuprofen ? ?Past Medical History:  ?Diagnosis Date  ? Arthritis   ? gout  ? Diabetes mellitus ([place])   ? Gout   ? Gout   ? Hypertension   ? ? ?BP 124/84 (BP Location: Left Arm, Cuff Size: Normal)   Pulse 95   Temp 98.1 ?F (36.7 ?C) (Oral)   Resp 16   Ht 5\' 5"  (1.651 m)   Wt 178 lb 9.6 oz (81 kg)   SpO2 96%   BMI 29.72 kg/m?  ?Gen: awake, alert, appearing stated age ?Lungs: No accessory muscle use ?Skin: Over the portion of cheek on the right where the orbicularis oris meets the zygomatic minor/major insertion, there is a raised area of excoriation with surrounding erythema and induration.  It is slightly warm compared to surrounding tissue with tenderness to palpation and blanching.  There is no drainage, fluctuance, streaking, or scaling. ?Psych: Age appropriate judgment and insight ? ?Furuncle - Plan: doxycycline (VIBRA-TABS) 100 MG tablet ? ?Looks like staph, will treat with 7 days of doxycycline.  There is no fluctuance to suggest an abscess.  Warning signs and symptoms verbalized and written down.  Ice, consider warm compresses, Tylenol, ibuprofen as needed. ?F/u prn. ?The patient voiced understanding and agreement to the plan. ? ?Shelda Pal, DO ?06/24/21 ?3:48 PM ? ?

## 2021-07-07 ENCOUNTER — Telehealth: Payer: 59

## 2021-07-07 NOTE — Telephone Encounter (Signed)
I have started the PA and it has been approved until 07/08/2022. I have called the pt to let him know. There was no answer so I left a voice mail stating that it had been approved and to call the pharmacy to get it filled.  ?

## 2021-07-07 NOTE — Telephone Encounter (Signed)
Pt called requesting a med list be emailed to him at [email or web address].  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 ?

## 2021-07-07 NOTE — Telephone Encounter (Signed)
I have called pt back and informed him that we have faxed/emailed him the med list, however I did inform him that he could get it from him my-chart. He stated that he forgot his information so I gave him his user name and will reset his password.  ? ?I have also called the pharmacy to find out the status of his Trulicity and he needs a pa. I have not seen that paperwork yet. So the pharmacy will resend it.  ?

## 2021-07-24 ENCOUNTER — Telehealth: Payer: 59 | Admitting: Family

## 2021-07-30 ENCOUNTER — Ambulatory Visit: Payer: 59 | Admitting: Family

## 2021-08-20 ENCOUNTER — Other Ambulatory Visit: Payer: Self-pay | Admitting: Family

## 2021-08-20 DIAGNOSIS — I10 Essential (primary) hypertension: Secondary | ICD-10-CM

## 2021-09-09 ENCOUNTER — Other Ambulatory Visit: Payer: Self-pay | Admitting: Family

## 2021-09-09 DIAGNOSIS — M109 Gout, unspecified: Secondary | ICD-10-CM

## 2021-11-03 ENCOUNTER — Other Ambulatory Visit: Payer: Self-pay | Admitting: Family

## 2021-11-03 DIAGNOSIS — I10 Essential (primary) hypertension: Secondary | ICD-10-CM

## 2021-11-04 ENCOUNTER — Telehealth: Payer: Self-pay | Admitting: Family

## 2021-11-04 ENCOUNTER — Other Ambulatory Visit: Payer: Self-pay

## 2021-11-04 MED ORDER — TRULICITY 3 MG/0.5ML ~~LOC~~ SOAJ: 3.0000 mg | SUBCUTANEOUS | 0 refills | Status: DC

## 2021-11-04 NOTE — Telephone Encounter (Signed)
He is overdue for OV to follow up on hypertension and Type 2 DM; last refill without OV.

## 2021-11-04 NOTE — Telephone Encounter (Signed)
Spoke with pt, pt was very apologetic. States he recently started a new job and has had trouble getting time off to come in the office. Stressed to pt the importance of coming in for a OV, he expressed understanding and said he will call us back and make an appointment.

## 2022-01-15 ENCOUNTER — Encounter: Payer: Self-pay | Admitting: Family

## 2022-01-15 ENCOUNTER — Ambulatory Visit (INDEPENDENT_AMBULATORY_CARE_PROVIDER_SITE_OTHER): Payer: 59 | Admitting: Family

## 2022-01-15 ENCOUNTER — Other Ambulatory Visit: Payer: Self-pay | Admitting: Family

## 2022-01-15 VITALS — BP 130/80 | HR 90 | Temp 98.2°F | Ht 66.0 in | Wt 184.0 lb

## 2022-01-15 DIAGNOSIS — M79641 Pain in right hand: Secondary | ICD-10-CM

## 2022-01-15 DIAGNOSIS — E119 Type 2 diabetes mellitus without complications: Secondary | ICD-10-CM

## 2022-01-15 DIAGNOSIS — E782 Mixed hyperlipidemia: Secondary | ICD-10-CM | POA: Diagnosis not present

## 2022-01-15 DIAGNOSIS — I10 Essential (primary) hypertension: Secondary | ICD-10-CM | POA: Diagnosis not present

## 2022-01-15 MED ORDER — ENALAPRIL MALEATE 20 MG PO TABS: ORAL_TABLET | ORAL | 3 refills | Status: DC

## 2022-01-15 MED ORDER — METFORMIN HCL ER 500 MG PO TB24: 500.0000 mg | ORAL_TABLET | Freq: Two times a day (BID) | ORAL | 1 refills | Status: DC

## 2022-01-15 MED ORDER — ATORVASTATIN CALCIUM 40 MG PO TABS: 40.0000 mg | ORAL_TABLET | Freq: Every day | ORAL | 3 refills | Status: DC

## 2022-01-15 MED ORDER — ALLOPURINOL 100 MG PO TABS: 100.0000 mg | ORAL_TABLET | Freq: Every day | ORAL | 3 refills | Status: DC
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
## 2022-01-16 LAB — COMPREHENSIVE METABOLIC PANEL
AG Ratio: 1.2 (calc) (ref 1.0–2.5)
ALT: 26 U/L (ref 9–46)
AST: 26 U/L (ref 10–35)
Albumin: 4.4 g/dL (ref 3.6–5.1)
Alkaline phosphatase (APISO): 70 U/L (ref 35–144)
BUN: 7 mg/dL (ref 7–25)
CO2: 24 mmol/L (ref 20–32)
Calcium: 10.1 mg/dL (ref 8.6–10.3)
Chloride: 102 mmol/L (ref 98–110)
Creat: 0.93 mg/dL (ref 0.70–1.30)
Globulin: 3.7 g/dL (calc) (ref 1.9–3.7)
Glucose, Bld: 135 mg/dL — ABNORMAL HIGH (ref 65–99)
Potassium: 4.6 mmol/L (ref 3.5–5.3)
Sodium: 139 mmol/L (ref 135–146)
Total Bilirubin: 0.4 mg/dL (ref 0.2–1.2)
Total Protein: 8.1 g/dL (ref 6.1–8.1)

## 2022-01-16 LAB — LIPID PANEL
Cholesterol: 201 mg/dL — ABNORMAL HIGH (ref <200)
HDL: 50 mg/dL (ref >=40)
LDL Cholesterol (Calc): 134 mg/dL (calc) — ABNORMAL HIGH
Non-HDL Cholesterol (Calc): 151 mg/dL (calc) — ABNORMAL HIGH (ref <130)
Total CHOL/HDL Ratio: 4 (calc) (ref <5.0)
Triglycerides: 77 mg/dL (ref <150)

## 2022-01-16 LAB — CBC WITH DIFFERENTIAL/PLATELET
Absolute Monocytes: 610 cells/uL (ref 200–950)
Basophils Absolute: 58 cells/uL (ref 0–200)
Basophils Relative: 1.1 %
Eosinophils Absolute: 58 cells/uL (ref 15–500)
Eosinophils Relative: 1.1 %
HCT: 48.2 % (ref 38.5–50.0)
Hemoglobin: 15.8 g/dL (ref 13.2–17.1)
Lymphs Abs: 2178 cells/uL (ref 850–3900)
MCH: 26.9 pg — ABNORMAL LOW (ref 27.0–33.0)
MCHC: 32.8 g/dL (ref 32.0–36.0)
MCV: 82 fL (ref 80.0–100.0)
MPV: 9.3 fL (ref 7.5–12.5)
Monocytes Relative: 11.5 %
Neutro Abs: 2396 cells/uL (ref 1500–7800)
Neutrophils Relative %: 45.2 %
Platelets: 371 10*3/uL (ref 140–400)
RBC: 5.88 10*6/uL — ABNORMAL HIGH (ref 4.20–5.80)
RDW: 13.4 % (ref 11.0–15.0)
Total Lymphocyte: 41.1 %
WBC: 5.3 10*3/uL (ref 3.8–10.8)

## 2022-01-16 LAB — HEMOGLOBIN A1C
Hgb A1c MFr Bld: 8.7 % of total Hgb — ABNORMAL HIGH (ref <5.7)
Mean Plasma Glucose: 203 mg/dL
eAG (mmol/L): 11.2 mmol/L

## 2022-01-16 LAB — MICROALBUMIN / CREATININE URINE RATIO
Creatinine, Urine: 135 mg/dL (ref 20–320)
Microalb Creat Ratio: 56 mcg/mg creat — ABNORMAL HIGH (ref <30)
Microalb, Ur: 7.5 mg/dL

## 2022-01-19 ENCOUNTER — Other Ambulatory Visit: Payer: Self-pay | Admitting: Family

## 2022-01-19 ENCOUNTER — Telehealth: Payer: Self-pay

## 2022-01-19 MED ORDER — TRULICITY 0.75 MG/0.5ML ~~LOC~~ SOAJ: 0.7500 mg | SUBCUTANEOUS | 2 refills | Status: DC

## 2022-01-19 NOTE — Telephone Encounter (Signed)
The trulicity is not covered and even if we get it covered with his new insurance it may be a high Copay. Do you want to send the pt the alterative medications or do you want me to start the PA?  Please assist.   I did try to call pt to relay the lab results and there was no answer so I left a message to call back.

## 2022-01-19 NOTE — Telephone Encounter (Signed)
Pt has called back and I have given him the lab results. He stated that he will stay on the metformin and made the 3 month follow up.   I have informed him that I will go ahead and start the PA and if the copay is still high then we may change up the medication and he stated understanding.

## 2022-01-19 NOTE — Telephone Encounter (Signed)
Trulicity 9.62/9.5MW UXL:[id number] DOB: [birth year]  GOT a message stating   This request has been approved using information available on the patient's profile. OZDGUY:[id number];ZDGLOV:[id number];Review Type:Prior Auth;Coverage Start Date:12/20/2021;Coverage End Date:01/19/2023;

## 2022-01-21 ENCOUNTER — Telehealth: Payer: Self-pay

## 2022-01-21 NOTE — Telephone Encounter (Signed)
I have called the pt to inform him of the provider message below and to see if he was able to pick up medication. There was no answer so I left a message to call back.

## 2022-01-21 NOTE — Telephone Encounter (Signed)
Pt called and information given about the medications. He stated understanding.

## 2022-01-21 NOTE — Telephone Encounter (Signed)
Pt has called [place] back and I have informed him that I did get the approval from insurance and if the medication is still too expensive for whatever reason then he should call us back. If he is able to get it without any issues then he doesn't need to call me back. He stated understanding.

## 2022-01-25 ENCOUNTER — Telehealth: Payer: Self-pay | Admitting: Family

## 2022-01-25 NOTE — Telephone Encounter (Signed)
Pt states Raliegh Ip has not received referral and would like to fax it to [phone].

## 2022-01-29 ENCOUNTER — Telehealth: Payer: Self-pay

## 2022-01-29 ENCOUNTER — Other Ambulatory Visit: Payer: Self-pay | Admitting: Family

## 2022-01-29 DIAGNOSIS — M6530 Trigger finger, unspecified finger: Secondary | ICD-10-CM

## 2022-01-29 NOTE — Telephone Encounter (Signed)
Pt called asking for 90 day Rx and another referral to a different PT.   He uses [facility] on [address] CB number: [phone].

## 2022-01-29 NOTE — Telephone Encounter (Signed)
I have called the pt back and informed him that his medication should be 90 day supply since that is what we wrote for him last time. Also he is requesting a referral to a different orthopedist for his possible trigger finger in his right ring finger.   Pt stated that he will double check his bottles when he gets home and he informed me that he doesn't have a problem with Percell Miller and Noemi Chapel but he wants to try someone else to see if they will do a bit better with care. I stated understanding and let him know I will send a message to the provider to let her know.

## 2022-02-03 ENCOUNTER — Other Ambulatory Visit: Payer: Self-pay | Admitting: Family

## 2022-02-03 DIAGNOSIS — M109 Gout, unspecified: Secondary | ICD-10-CM

## 2022-02-09 ENCOUNTER — Ambulatory Visit: Payer: 59 | Admitting: Orthopaedic Surgery

## 2022-02-17 ENCOUNTER — Ambulatory Visit (INDEPENDENT_AMBULATORY_CARE_PROVIDER_SITE_OTHER): Payer: 59 | Admitting: Orthopaedic Surgery

## 2022-02-17 DIAGNOSIS — M65341 Trigger finger, right ring finger: Secondary | ICD-10-CM | POA: Diagnosis not present

## 2022-02-17 MED ORDER — LIDOCAINE HCL 1 % IJ SOLN
0.3000 mL | INTRAMUSCULAR | Status: AC | PRN
  Administered 2022-02-17: .3 mL

## 2022-02-17 MED ORDER — METHYLPREDNISOLONE ACETATE 40 MG/ML IJ SUSP
13.3300 mg | INTRAMUSCULAR | Status: AC | PRN
  Administered 2022-02-17: 13.33 mg

## 2022-02-17 MED ORDER — BUPIVACAINE HCL 0.5 % IJ SOLN
0.3300 mL | INTRAMUSCULAR | Status: AC | PRN
  Administered 2022-02-17: .33 mL
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
## 2022-03-18 ENCOUNTER — Telehealth: Payer: Self-pay | Admitting: Family

## 2022-03-18 ENCOUNTER — Other Ambulatory Visit: Payer: Self-pay

## 2022-03-18 DIAGNOSIS — M109 Gout, unspecified: Secondary | ICD-10-CM

## 2022-03-18 MED ORDER — INDOMETHACIN 50 MG PO CAPS: ORAL_CAPSULE | ORAL | 0 refills | Status: DC

## 2022-03-18 NOTE — Telephone Encounter (Signed)
Rx has been refilled.  

## 2022-03-18 NOTE — Telephone Encounter (Signed)
Prescription Request  03/18/2022  Is this a "Controlled Substance" medicine? No  LOV: 01/15/2022  What is the name of the medication or equipment?  indomethacin (INDOCIN) 50 MG capsule   Have you contacted your pharmacy to request a refill? Yes   Which pharmacy would you like this sent to?  [facility] [phone] - [place], [place] - [address] AT [facility] [address] [place] [address] Phone: [phone] Fax: [phone]     Patient notified that their request is being sent to the clinical staff for review and that they should receive a response within 2 business days.   Please advise at [facility] [phone]

## 2022-03-19 ENCOUNTER — Telehealth: Payer: Self-pay | Admitting: Family

## 2022-03-19 NOTE — Telephone Encounter (Signed)
Patient would like to know if he can get a letter stating he is limited to what he can do/lift due to the back surgery he had done. He does not wish to re-injury his back with all the heavy lifting they are requiring him to do. Please advise.

## 2022-03-24 NOTE — Telephone Encounter (Signed)
Spoke with Pt and he is aware also wanted to schedule an appointment for 03/25/2022 to check his back, pt stated he "pulled his back at work' and is worried about the pain.

## 2022-03-25 ENCOUNTER — Ambulatory Visit (HOSPITAL_BASED_OUTPATIENT_CLINIC_OR_DEPARTMENT_OTHER)
Admission: RE | Admit: 2022-03-25 | Discharge: 2022-03-25 | Disposition: A | Discharge status: Home / Self Care | Payer: 59 | Source: Ambulatory Visit | Attending: Family | Admitting: Family

## 2022-03-25 ENCOUNTER — Other Ambulatory Visit: Payer: Self-pay | Admitting: Family

## 2022-03-25 ENCOUNTER — Ambulatory Visit (INDEPENDENT_AMBULATORY_CARE_PROVIDER_SITE_OTHER): Payer: 59 | Admitting: Family

## 2022-03-25 ENCOUNTER — Encounter: Payer: Self-pay | Admitting: Family

## 2022-03-25 VITALS — BP 142/82 | HR 92 | Temp 97.9°F | Resp 18 | Ht 66.0 in | Wt 187.0 lb

## 2022-03-25 DIAGNOSIS — M545 Low back pain, unspecified: Secondary | ICD-10-CM | POA: Diagnosis not present

## 2022-03-25 MED ORDER — MELOXICAM 15 MG PO TABS: 15.0000 mg | ORAL_TABLET | Freq: Every day | ORAL | 0 refills | Status: DC

## 2022-03-25 MED ORDER — METHOCARBAMOL 500 MG PO TABS: 500.0000 mg | ORAL_TABLET | Freq: Every evening | ORAL | 0 refills | Status: DC | PRN
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
## 2022-03-30 ENCOUNTER — Other Ambulatory Visit: Payer: Self-pay | Admitting: Family

## 2022-03-30 DIAGNOSIS — T85848S Pain due to other internal prosthetic devices, implants and grafts, sequela: Secondary | ICD-10-CM

## 2022-04-02 ENCOUNTER — Telehealth: Payer: Self-pay | Admitting: Family

## 2022-04-02 NOTE — Telephone Encounter (Signed)
Patient called to verify referrall info Patient then stated he would like to know if we can re-do his work note that LM wrote him at appt excusing him. It was written thru today but since he hasn't seen the other doctor yet theres nothing he can prove with employer  Please advise

## 2022-04-06 ENCOUNTER — Other Ambulatory Visit: Payer: Self-pay | Admitting: Family

## 2022-04-06 ENCOUNTER — Telehealth: Payer: Self-pay | Admitting: Family

## 2022-04-06 NOTE — Telephone Encounter (Signed)
Updated work note in [employer] community for him; his neurosurgeon will have to provide information for work absences or restrictions after 04/07/22.

## 2022-04-06 NOTE — Telephone Encounter (Signed)
Spoke with Pt he stated he does have an appointment with his neurosurgeon 04/07/2022. Pt was notified we will update his work note as well.

## 2022-04-07 NOTE — Telephone Encounter (Signed)
Spoke with Pt he is aware of the work note, Pt also stated he went to his appt with neurosurgeon this morning 04/07/2022 and they did confirm he had broken 2 screws in his back, they will have to do surgery again to fix them again he is just waiting for a CT scan. Pt will also keep follow up appt with [place] on 04/16/2022.

## 2022-04-08 ENCOUNTER — Other Ambulatory Visit: Payer: Self-pay | Admitting: Physician Assistant

## 2022-04-08 DIAGNOSIS — M960 Pseudarthrosis after fusion or arthrodesis: Secondary | ICD-10-CM

## 2022-04-16 ENCOUNTER — Ambulatory Visit: Payer: 59 | Admitting: Family

## 2022-04-20 ENCOUNTER — Ambulatory Visit
Admission: RE | Admit: 2022-04-20 | Discharge: 2022-04-20 | Disposition: A | Discharge status: Home / Self Care | Payer: 59 | Source: Ambulatory Visit | Attending: Physician Assistant | Admitting: Physician Assistant

## 2022-04-20 DIAGNOSIS — M960 Pseudarthrosis after fusion or arthrodesis: Secondary | ICD-10-CM

## 2022-04-22 ENCOUNTER — Encounter: Payer: Self-pay | Admitting: Family

## 2022-04-22 ENCOUNTER — Ambulatory Visit (INDEPENDENT_AMBULATORY_CARE_PROVIDER_SITE_OTHER): Payer: 59 | Admitting: Family

## 2022-04-22 VITALS — BP 126/80 | HR 94 | Ht 66.0 in | Wt 187.6 lb

## 2022-04-22 DIAGNOSIS — I10 Essential (primary) hypertension: Secondary | ICD-10-CM

## 2022-04-22 DIAGNOSIS — E782 Mixed hyperlipidemia: Secondary | ICD-10-CM

## 2022-04-22 DIAGNOSIS — E119 Type 2 diabetes mellitus without complications: Secondary | ICD-10-CM | POA: Diagnosis not present

## 2022-04-22 DIAGNOSIS — M5416 Radiculopathy, lumbar region: Secondary | ICD-10-CM | POA: Diagnosis not present

## 2022-04-22 LAB — COMPREHENSIVE METABOLIC PANEL
ALT: 28 U/L (ref 0–53)
AST: 28 U/L (ref 0–37)
Albumin: 4.8 g/dL (ref 3.5–5.2)
Alkaline Phosphatase: 70 U/L (ref 39–117)
BUN: 9 mg/dL (ref 6–23)
CO2: 27 mEq/L (ref 19–32)
Calcium: 10 mg/dL (ref 8.4–10.5)
Chloride: 101 mEq/L (ref 96–112)
Creatinine, Ser: 0.9 mg/dL (ref 0.40–1.50)
GFR: 93.6 mL/min (ref >60.00)
Glucose, Bld: 124 mg/dL — ABNORMAL HIGH (ref 70–99)
Potassium: 4.5 mEq/L (ref 3.5–5.1)
Sodium: 137 mEq/L (ref 135–145)
Total Bilirubin: 0.5 mg/dL (ref 0.2–1.2)
Total Protein: 8.3 g/dL (ref 6.0–8.3)

## 2022-04-22 LAB — MICROALBUMIN / CREATININE URINE RATIO
Creatinine,U: 53.4 mg/dL
Microalb Creat Ratio: 4.5 mg/g (ref 0.0–30.0)
Microalb, Ur: 2.4 mg/dL — ABNORMAL HIGH (ref 0.0–1.9)

## 2022-04-22 LAB — HEMOGLOBIN A1C: Hgb A1c MFr Bld: 7.7 % — ABNORMAL HIGH (ref 4.6–6.5)

## 2022-04-22 MED ORDER — ALLOPURINOL 100 MG PO TABS: 100.0000 mg | ORAL_TABLET | Freq: Every day | ORAL | 3 refills | Status: DC
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
## 2022-04-26 ENCOUNTER — Telehealth: Payer: Self-pay | Admitting: *Deleted

## 2022-04-26 NOTE — Telephone Encounter (Signed)
-----  Message from Marrian Salvage, [place] sent at 04/23/2022 12:13 PM EST ----- His diabetes control is okay but I would like to go ahead and increase the dosage of Trulicity. Did he find out what pharmacy we need to be using?

## 2022-04-26 NOTE — Telephone Encounter (Signed)
Pt notified of results.  He will be staying at [facility] for now.  You can send the trulicity there.

## 2022-04-27 ENCOUNTER — Other Ambulatory Visit: Payer: Self-pay | Admitting: Family

## 2022-04-27 MED ORDER — TRULICITY 1.5 MG/0.5ML ~~LOC~~ SOAJ: 1.5000 mg | SUBCUTANEOUS | 2 refills | Status: DC

## 2022-05-25 IMAGING — CT CT L SPINE W/O CM
1 of 7 series · 6 of 14 positions shown, 8 images · non-contrast
Comparison: MRI of lumbar spine 08/25/2020

CLINICAL DATA: Pain and numbness. Tingling in the legs bilaterally,
right greater than left. Symptoms for 1 year. Lumbar spine surgery
01/05/2022

EXAM:
CT LUMBAR SPINE WITHOUT CONTRAST
TECHNIQUE: Multidetector CT imaging of the lumbar spine was performed without
intravenous contrast administration. Multiplanar CT image
reconstructions were also generated.

[Series 3: l spine soft · axial · 0.28mm/px · z∈[-205,-5]mm · 6 of 139 slices shown, 8 images]
[im 20/139  soft-tissue]
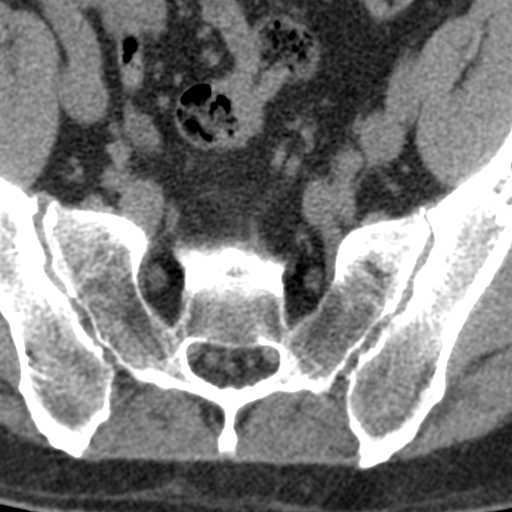
[im 20/139  bone]
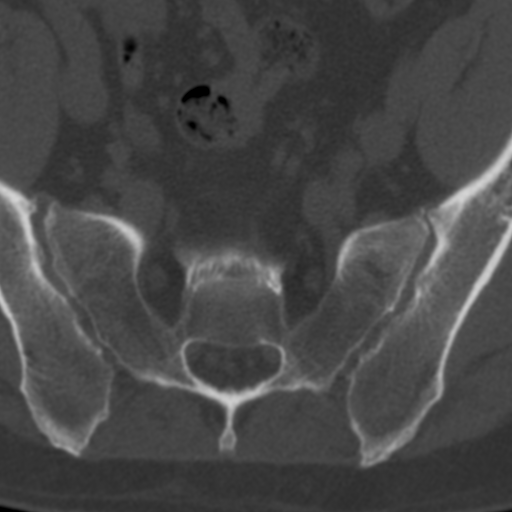
[im 40/139  bone]
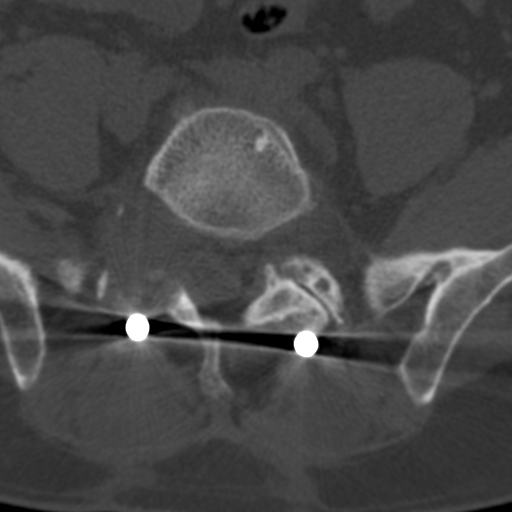
[im 60/139  bone]
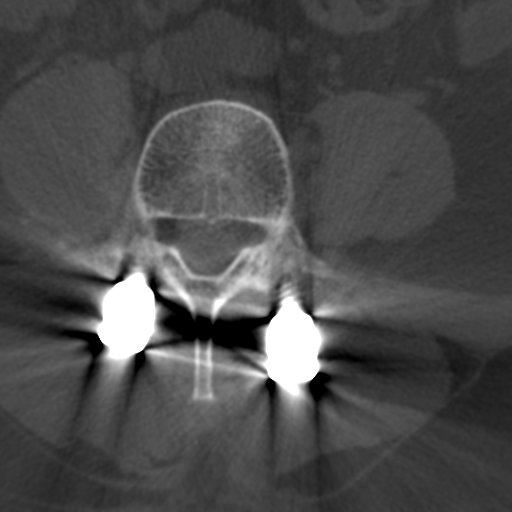
[im 79/139  bone]
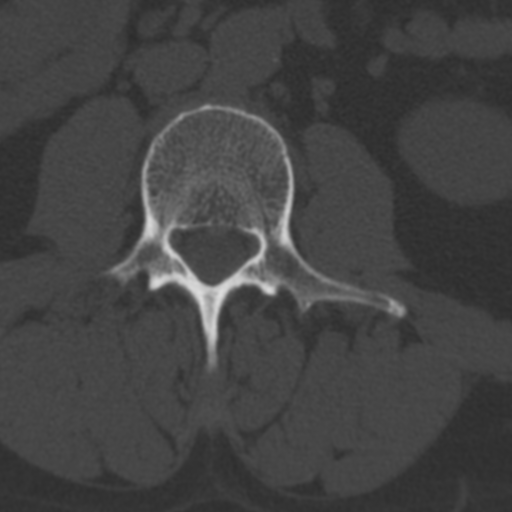
[im 99/139  soft-tissue]
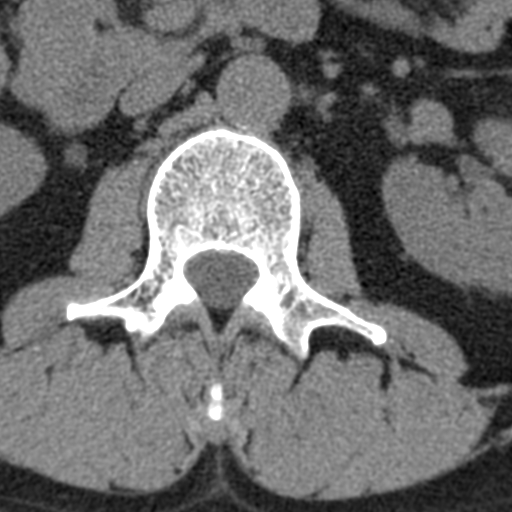
[im 99/139  bone]
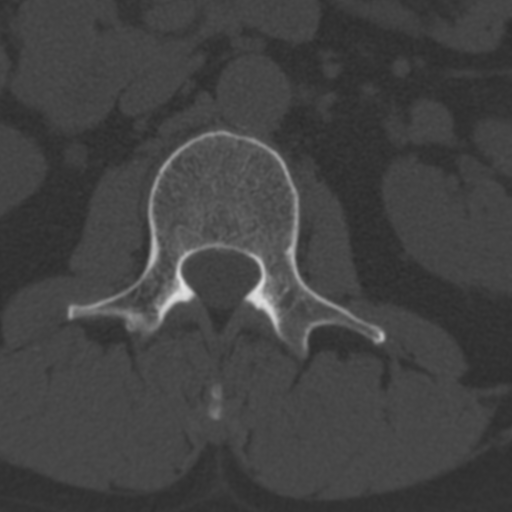
[im 119/139  bone]
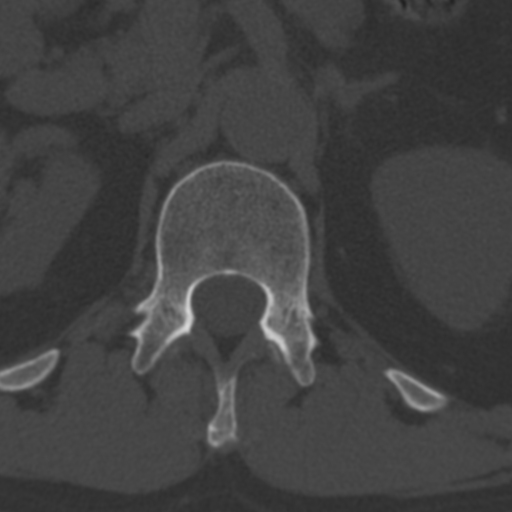

[6 of 14 positions shown; findings below may reference images not displayed]

FINDINGS: Segmentation: 5 non rib-bearing lumbar type vertebral bodies are
present. The lowest fully formed vertebral body is L5.

Alignment: 4-5 mm retrolisthesis is present at L4-5. Mild rightward
curvature is centered at L4.

Vertebrae: Pedicle screws are present bilaterally at L4, L5 and S1.
Endplate sclerotic changes are present at L4-5. Vertebral body
heights are maintained. No focal osseous lesions are present.

Paraspinal and other soft tissues: Limited imaging the abdomen is
unremarkable. There is no significant adenopathy. No solid organ
lesions are present.

Disc levels: T12-L1: Negative.

L1-2: Negative.

L2-3: Negative.

L3-4: Mild facet hypertrophy and ligamentum flavum thickening is
worse on the right. No significant disc protrusion or stenosis is
present.

L4-5: Discectomy and right foraminotomy noted. Disc spacer is to the
right of midline. No significant bridging bone is present at this
time. No residual recurrent stenosis is present.

L5-S1: Right foraminotomy noted. Fat planes are obscured the right
foramen. No osseous narrowing is present. Disc spacer is central. No
bridging bone is present across the disc space.

Pedicle screws are engaged. No resorption of bone.

SI joints are fused bilaterally.
IMPRESSION: 1. Discectomy and right foraminotomy at L4-5 and L5-S1 without
residual or recurrent stenosis at these levels.
2. No bridging bone across the disc spaces at L4-5 or L5-S1 at this
time. Hardware is intact. No evidence for complication.
3. Fusion of the SI joints bilaterally.
4. Mild facet hypertrophy and ligamentum flavum thickening at L3-4
without significant disc protrusion or stenosis.

## 2022-08-31 ENCOUNTER — Other Ambulatory Visit: Payer: Self-pay | Admitting: Neurological Surgery

## 2022-09-22 ENCOUNTER — Encounter (HOSPITAL_COMMUNITY): Payer: Self-pay

## 2022-09-22 NOTE — Pre-Procedure Instructions (Signed)
Surgical Instructions   Your procedure is scheduled on Monday, July 8th. Report to [facility] Main Entrance "A" at 05:30 A.M., then check in with the Admitting office. Any questions or running late day of surgery: call [phone]  Questions prior to your surgery date: call [phone], Monday-Friday, 8am-4pm. If you experience any cold or flu symptoms such as cough, fever, chills, shortness of breath, etc. between now and your scheduled surgery, please notify us at the above number.     Remember:  Do not eat after midnight the night before your surgery  You may drink clear liquids until 04:30 AM the morning of your surgery.   Clear liquids allowed are: Water, Non-Citrus Juices (without pulp), Carbonated Beverages, Clear Tea, Black Coffee Only (NO MILK, CREAM OR POWDERED CREAMER of any kind), and Gatorade.    Take these medicines the morning of surgery with A SIP OF WATER  allopurinol (ZYLOPRIM)     May take these medicines IF NEEDED: acetaminophen (TYLENOL)    One week prior to surgery, STOP taking any Aspirin (unless otherwise instructed by your surgeon) Aleve, Naproxen, Ibuprofen, Motrin, Advil, Goody's, BC's, indomethacin (INDOCIN), meloxicam (MOBIC), all herbal medications, fish oil, and non-prescription vitamins.  WHAT DO I DO ABOUT MY DIABETES MEDICATION?   Hold Dulaglutide (TRULICITY) for 7 days prior to surgery.  Do not take metFORMIN (GLUCOPHAGE-XR) morning of surgery.        HOW TO MANAGE YOUR DIABETES BEFORE AND AFTER SURGERY  Why is it important to control my blood sugar before and after surgery? Improving blood sugar levels before and after surgery helps healing and can limit problems. A way of improving blood sugar control is eating a healthy diet by:  Eating less sugar and carbohydrates  Increasing activity/exercise  Talking with your doctor about reaching your blood sugar goals High blood sugars (greater than 180 mg/dL) can raise your risk of infections  and slow your recovery, so you will need to focus on controlling your diabetes during the weeks before surgery. Make sure that the doctor who takes care of your diabetes knows about your planned surgery including the date and location.  How do I manage my blood sugar before surgery? Check your blood sugar at least 4 times a day, starting 2 days before surgery, to make sure that the level is not too high or low.  Check your blood sugar the morning of your surgery when you wake up and every 2 hours until you get to the Short Stay unit.  If your blood sugar is less than 70 mg/dL, you will need to treat for low blood sugar: Do not take insulin. Treat a low blood sugar (less than 70 mg/dL) with  cup of clear juice (cranberry or apple), 4 glucose tablets, OR glucose gel. Recheck blood sugar in 15 minutes after treatment (to make sure it is greater than 70 mg/dL). If your blood sugar is not greater than 70 mg/dL on recheck, call [phone] for further instructions. Report your blood sugar to the short stay nurse when you get to Short Stay.  If you are admitted to the hospital after surgery: Your blood sugar will be checked by the staff and you will probably be given insulin after surgery (instead of oral diabetes medicines) to make sure you have good blood sugar levels. The goal for blood sugar control after surgery is 80-180 mg/dL.                     Do NOT  Smoke (Tobacco/Vaping) for 24 hours prior to your procedure.  If you use a CPAP at night, you may bring your mask/headgear for your overnight stay.   You will be asked to remove any contacts, glasses, piercing's, hearing aid's, dentures/partials prior to surgery. Please bring cases for these items if needed.    Patients discharged the day of surgery will not be allowed to drive home, and someone needs to stay with them for 24 hours.  SURGICAL WAITING ROOM VISITATION Patients may have no more than 2 support people in the waiting area -  these visitors may rotate.   Pre-op nurse will coordinate an appropriate time for 1 ADULT support person, who may not rotate, to accompany patient in pre-op.  Children under the age of 58 must have an adult with them who is not the patient and must remain in the main waiting area with an adult.  If the patient needs to stay at the hospital during part of their recovery, the visitor guidelines for inpatient rooms apply.  Please refer to the [facility] website for the visitor guidelines for any additional information.   If you received a COVID test during your pre-op visit  it is requested that you wear a mask when out in public, stay away from anyone that may not be feeling well and notify your surgeon if you develop symptoms. If you have been in contact with anyone that has tested positive in the last 10 days please notify you surgeon.      Pre-operative 5 CHG Bath Instructions   You can play a key role in reducing the risk of infection after surgery. Your skin needs to be as free of germs as possible. You can reduce the number of germs on your skin by washing with CHG (chlorhexidine gluconate) soap before surgery. CHG is an antiseptic soap that kills germs and continues to kill germs even after washing.   DO NOT use if you have an allergy to chlorhexidine/CHG or antibacterial soaps. If your skin becomes reddened or irritated, stop using the CHG and notify one of our RNs at [phone].   Please shower with the CHG soap starting 4 days before surgery using the following schedule:     Please keep in mind the following:  DO NOT shave, including legs and underarms, starting the day of your first shower.   You may shave your face at any point before/day of surgery.  Place clean sheets on your bed the day you start using CHG soap. Use a clean washcloth (not used since being washed) for each shower. DO NOT sleep with pets once you start using the CHG.   CHG Shower Instructions:  If you  choose to wash your hair and private area, wash first with your normal shampoo/soap.  After you use shampoo/soap, rinse your hair and body thoroughly to remove shampoo/soap residue.  Turn the water OFF and apply about 3 tablespoons (45 ml) of CHG soap to a CLEAN washcloth.  Apply CHG soap ONLY FROM YOUR NECK DOWN TO YOUR TOES (washing for 3-5 minutes)  DO NOT use CHG soap on face, private areas, open wounds, or sores.  Pay special attention to the area where your surgery is being performed.  If you are having back surgery, having someone wash your back for you may be helpful. Wait 2 minutes after CHG soap is applied, then you may rinse off the CHG soap.  Pat dry with a clean towel  Put on clean clothes/pajamas  If you choose to wear lotion, please use ONLY the CHG-compatible lotions on the back of this paper.   Additional instructions for the day of surgery: DO NOT APPLY any lotions, deodorants, cologne, or perfumes.   Do not bring valuables to the hospital. [facility] is not responsible for any belongings/valuables. Do not wear nail polish, gel polish, artificial nails, or any other type of covering on natural nails (fingers and toes) Do not wear jewelry or makeup Put on clean/comfortable clothes.  Please brush your teeth.  Ask your nurse before applying any prescription medications to the skin.     CHG Compatible Lotions   Aveeno Moisturizing lotion  Cetaphil Moisturizing Cream  Cetaphil Moisturizing Lotion  Clairol Herbal Essence Moisturizing Lotion, Dry Skin  Clairol Herbal Essence Moisturizing Lotion, Extra Dry Skin  Clairol Herbal Essence Moisturizing Lotion, Normal Skin  Curel Age Defying Therapeutic Moisturizing Lotion with Alpha Hydroxy  Curel Extreme Care Body Lotion  Curel Soothing Hands Moisturizing Hand Lotion  Curel Therapeutic Moisturizing Cream, Fragrance-Free  Curel Therapeutic Moisturizing Lotion, Fragrance-Free  Curel Therapeutic Moisturizing Lotion, Original  Formula  Eucerin Daily Replenishing Lotion  Eucerin Dry Skin Therapy Plus Alpha Hydroxy Crme  Eucerin Dry Skin Therapy Plus Alpha Hydroxy Lotion  Eucerin Original Crme  Eucerin Original Lotion  Eucerin Plus Crme Eucerin Plus Lotion  Eucerin TriLipid Replenishing Lotion  Keri Anti-Bacterial Hand Lotion  Keri Deep Conditioning Original Lotion Dry Skin Formula Softly Scented  Keri Deep Conditioning Original Lotion, Fragrance Free Sensitive Skin Formula  Keri Lotion Fast Absorbing Fragrance Free Sensitive Skin Formula  Keri Lotion Fast Absorbing Softly Scented Dry Skin Formula  Keri Original Lotion  Keri Skin Renewal Lotion Keri Silky Smooth Lotion  Keri Silky Smooth Sensitive Skin Lotion  Nivea Body Creamy Conditioning Oil  Nivea Body Extra Enriched Lotion  Nivea Body Original Lotion  Nivea Body Sheer Moisturizing Lotion Nivea Crme  Nivea Skin Firming Lotion  NutraDerm 30 Skin Lotion  NutraDerm Skin Lotion  NutraDerm Therapeutic Skin Cream  NutraDerm Therapeutic Skin Lotion  ProShield Protective Hand Cream  Provon moisturizing lotion  Please read over the following fact sheets that you were given.

## 2022-09-23 ENCOUNTER — Encounter (HOSPITAL_COMMUNITY): Payer: Self-pay

## 2022-09-23 ENCOUNTER — Encounter (HOSPITAL_COMMUNITY)
Admission: RE | Admit: 2022-09-23 | Discharge: 2022-09-23 | Disposition: A | Discharge status: Home / Self Care | Payer: 59 | Source: Ambulatory Visit | Attending: Neurological Surgery | Admitting: Neurological Surgery

## 2022-09-23 ENCOUNTER — Other Ambulatory Visit: Payer: Self-pay

## 2022-09-23 VITALS — BP 163/105 | HR 60 | Temp 98.3°F | Resp 18 | Ht 66.0 in | Wt 186.1 lb

## 2022-09-23 DIAGNOSIS — E119 Type 2 diabetes mellitus without complications: Secondary | ICD-10-CM | POA: Insufficient documentation

## 2022-09-23 DIAGNOSIS — Z01818 Encounter for other preprocedural examination: Secondary | ICD-10-CM | POA: Insufficient documentation

## 2022-09-23 LAB — CBC
HCT: 49.5 % (ref 39.0–52.0)
Hemoglobin: 16 g/dL (ref 13.0–17.0)
MCH: 27 pg (ref 26.0–34.0)
MCHC: 32.3 g/dL (ref 30.0–36.0)
MCV: 83.6 fL (ref 80.0–100.0)
Platelets: 346 10*3/uL (ref 150–400)
RBC: 5.92 MIL/uL — ABNORMAL HIGH (ref 4.22–5.81)
RDW: 13.6 % (ref 11.5–15.5)
WBC: 5 10*3/uL (ref 4.0–10.5)
nRBC: 0 % (ref 0.0–0.2)

## 2022-09-23 LAB — SURGICAL PCR SCREEN
MRSA, PCR: NEGATIVE
Staphylococcus aureus: NEGATIVE

## 2022-09-23 LAB — BASIC METABOLIC PANEL
Anion gap: 10 (ref 5–15)
BUN: 11 mg/dL (ref 6–20)
CO2: 25 mmol/L (ref 22–32)
Calcium: 9.7 mg/dL (ref 8.9–10.3)
Chloride: 100 mmol/L (ref 98–111)
Creatinine, Ser: 0.93 mg/dL (ref 0.61–1.24)
GFR, Estimated: >60 mL/min (ref >60)
Glucose, Bld: 138 mg/dL — ABNORMAL HIGH (ref 70–99)
Potassium: 4.5 mmol/L (ref 3.5–5.1)
Sodium: 135 mmol/L (ref 135–145)

## 2022-09-23 LAB — GLUCOSE, CAPILLARY: Glucose-Capillary: 150 mg/dL — ABNORMAL HIGH (ref 70–99)

## 2022-09-23 LAB — HEMOGLOBIN A1C
Hgb A1c MFr Bld: 7.6 % — ABNORMAL HIGH (ref 4.8–5.6)
Mean Plasma Glucose: 171.42 mg/dL

## 2022-09-23 NOTE — Pre-Procedure Instructions (Signed)
Surgical Instructions   Your procedure is scheduled on Monday, July 8th. Report to [facility] Main Entrance "A" at 05:30 A.M., then check in with the Admitting office. Any questions or running late day of surgery: call [phone]  Questions prior to your surgery date: call [phone], Monday-Friday, 8am-4pm. If you experience any cold or flu symptoms such as cough, fever, chills, shortness of breath, etc. between now and your scheduled surgery, please notify us at the above number.     Remember:  Do not eat after midnight the night before your surgery  You may drink clear liquids until 04:30 AM the morning of your surgery.   Clear liquids allowed are: Water, Non-Citrus Juices (without pulp), Carbonated Beverages, Clear Tea, Black Coffee Only (NO MILK, CREAM OR POWDERED CREAMER of any kind), and Gatorade.    Take these medicines the morning of surgery with A SIP OF WATER      May take these medicines IF NEEDED: acetaminophen (TYLENOL)  allopurinol (ZYLOPRIM)   One week prior to surgery, STOP taking any Aspirin (unless otherwise instructed by your surgeon) Aleve, Naproxen, Ibuprofen, Motrin, Advil, Goody's, BC's, indomethacin (INDOCIN), meloxicam (MOBIC), all herbal medications, fish oil, and non-prescription vitamins.  WHAT DO I DO ABOUT MY DIABETES MEDICATION?   Hold Dulaglutide (TRULICITY) for 7 days prior to surgery. Your last dose should be 10/03/22. Do not take after 10/03/22.   Do not take metFORMIN (GLUCOPHAGE-XR) morning of surgery.        HOW TO MANAGE YOUR DIABETES BEFORE AND AFTER SURGERY  Why is it important to control my blood sugar before and after surgery? Improving blood sugar levels before and after surgery helps healing and can limit problems. A way of improving blood sugar control is eating a healthy diet by:  Eating less sugar and carbohydrates  Increasing activity/exercise  Talking with your doctor about reaching your blood sugar goals High blood  sugars (greater than 180 mg/dL) can raise your risk of infections and slow your recovery, so you will need to focus on controlling your diabetes during the weeks before surgery. Make sure that the doctor who takes care of your diabetes knows about your planned surgery including the date and location.  How do I manage my blood sugar before surgery? Check your blood sugar at least 4 times a day, starting 2 days before surgery, to make sure that the level is not too high or low.  Check your blood sugar the morning of your surgery when you wake up and every 2 hours until you get to the Short Stay unit.  If your blood sugar is less than 70 mg/dL, you will need to treat for low blood sugar: Do not take insulin. Treat a low blood sugar (less than 70 mg/dL) with  cup of clear juice (cranberry or apple), 4 glucose tablets, OR glucose gel. Recheck blood sugar in 15 minutes after treatment (to make sure it is greater than 70 mg/dL). If your blood sugar is not greater than 70 mg/dL on recheck, call [phone] for further instructions. Report your blood sugar to the short stay nurse when you get to Short Stay.  If you are admitted to the hospital after surgery: Your blood sugar will be checked by the staff and you will probably be given insulin after surgery (instead of oral diabetes medicines) to make sure you have good blood sugar levels. The goal for blood sugar control after surgery is 80-180 mg/dL.  Do NOT Smoke (Tobacco/Vaping) for 24 hours prior to your procedure.  If you use a CPAP at night, you may bring your mask/headgear for your overnight stay.   You will be asked to remove any contacts, glasses, piercing's, hearing aid's, dentures/partials prior to surgery. Please bring cases for these items if needed.    Patients discharged the day of surgery will not be allowed to drive home, and someone needs to stay with them for 24 hours.  SURGICAL WAITING ROOM  VISITATION Patients may have no more than 2 support people in the waiting area - these visitors may rotate.   Pre-op nurse will coordinate an appropriate time for 1 ADULT support person, who may not rotate, to accompany patient in pre-op.  Children under the age of 68 must have an adult with them who is not the patient and must remain in the main waiting area with an adult.  If the patient needs to stay at the hospital during part of their recovery, the visitor guidelines for inpatient rooms apply.  Please refer to the [facility] website for the visitor guidelines for any additional information.   If you received a COVID test during your pre-op visit  it is requested that you wear a mask when out in public, stay away from anyone that may not be feeling well and notify your surgeon if you develop symptoms. If you have been in contact with anyone that has tested positive in the last 10 days please notify you surgeon.      Pre-operative 5 CHG Bath Instructions   You can play a key role in reducing the risk of infection after surgery. Your skin needs to be as free of germs as possible. You can reduce the number of germs on your skin by washing with CHG (chlorhexidine gluconate) soap before surgery. CHG is an antiseptic soap that kills germs and continues to kill germs even after washing.   DO NOT use if you have an allergy to chlorhexidine/CHG or antibacterial soaps. If your skin becomes reddened or irritated, stop using the CHG and notify one of our RNs at [phone].   Please shower with the CHG soap starting 4 days before surgery using the following schedule:     Please keep in mind the following:  DO NOT shave, including legs and underarms, starting the day of your first shower.   You may shave your face at any point before/day of surgery.  Place clean sheets on your bed the day you start using CHG soap. Use a clean washcloth (not used since being washed) for each shower. DO NOT  sleep with pets once you start using the CHG.   CHG Shower Instructions:  If you choose to wash your hair and private area, wash first with your normal shampoo/soap.  After you use shampoo/soap, rinse your hair and body thoroughly to remove shampoo/soap residue.  Turn the water OFF and apply about 3 tablespoons (45 ml) of CHG soap to a CLEAN washcloth.  Apply CHG soap ONLY FROM YOUR NECK DOWN TO YOUR TOES (washing for 3-5 minutes)  DO NOT use CHG soap on face, private areas, open wounds, or sores.  Pay special attention to the area where your surgery is being performed.  If you are having back surgery, having someone wash your back for you may be helpful. Wait 2 minutes after CHG soap is applied, then you may rinse off the CHG soap.  Pat dry with a clean towel  Put on clean  clothes/pajamas   If you choose to wear lotion, please use ONLY the CHG-compatible lotions on the back of this paper.   Additional instructions for the day of surgery: DO NOT APPLY any lotions, deodorants, cologne, or perfumes.   Do not bring valuables to the hospital. [facility] is not responsible for any belongings/valuables. Do not wear nail polish, gel polish, artificial nails, or any other type of covering on natural nails (fingers and toes) Do not wear jewelry or makeup Put on clean/comfortable clothes.  Please brush your teeth.  Ask your nurse before applying any prescription medications to the skin.     CHG Compatible Lotions   Aveeno Moisturizing lotion  Cetaphil Moisturizing Cream  Cetaphil Moisturizing Lotion  Clairol Herbal Essence Moisturizing Lotion, Dry Skin  Clairol Herbal Essence Moisturizing Lotion, Extra Dry Skin  Clairol Herbal Essence Moisturizing Lotion, Normal Skin  Curel Age Defying Therapeutic Moisturizing Lotion with Alpha Hydroxy  Curel Extreme Care Body Lotion  Curel Soothing Hands Moisturizing Hand Lotion  Curel Therapeutic Moisturizing Cream, Fragrance-Free  Curel Therapeutic  Moisturizing Lotion, Fragrance-Free  Curel Therapeutic Moisturizing Lotion, Original Formula  Eucerin Daily Replenishing Lotion  Eucerin Dry Skin Therapy Plus Alpha Hydroxy Crme  Eucerin Dry Skin Therapy Plus Alpha Hydroxy Lotion  Eucerin Original Crme  Eucerin Original Lotion  Eucerin Plus Crme Eucerin Plus Lotion  Eucerin TriLipid Replenishing Lotion  Keri Anti-Bacterial Hand Lotion  Keri Deep Conditioning Original Lotion Dry Skin Formula Softly Scented  Keri Deep Conditioning Original Lotion, Fragrance Free Sensitive Skin Formula  Keri Lotion Fast Absorbing Fragrance Free Sensitive Skin Formula  Keri Lotion Fast Absorbing Softly Scented Dry Skin Formula  Keri Original Lotion  Keri Skin Renewal Lotion Keri Silky Smooth Lotion  Keri Silky Smooth Sensitive Skin Lotion  Nivea Body Creamy Conditioning Oil  Nivea Body Extra Enriched Lotion  Nivea Body Original Lotion  Nivea Body Sheer Moisturizing Lotion Nivea Crme  Nivea Skin Firming Lotion  NutraDerm 30 Skin Lotion  NutraDerm Skin Lotion  NutraDerm Therapeutic Skin Cream  NutraDerm Therapeutic Skin Lotion  ProShield Protective Hand Cream  Provon moisturizing lotion  Please read over the following fact sheets that you were given.

## 2022-09-23 NOTE — Progress Notes (Addendum)
PCP - Olive Bass, FNP  Cardiologist - denies  PPM/ICD - denies   Chest x-ray - N/A EKG - 09/23/2022 Stress Test - denies ECHO - denies Cardiac Cath - denies  Sleep Study - denies   Fasting Blood Sugar - CBG 150 today. Pt states he does not check his blood sugar regularly. A1c done today.   Last dose of GLP1 agonist-  Trulicity GLP1 instructions: do not take for 7 days prior to surgery- last dose will be 10/03/22  Blood Thinner Instructions: N/A Aspirin Instructions:N/A  ERAS Protcol - ERAS per order   COVID TEST- N/A   Anesthesia review: yes, BP elevated at PAT 163/105. Pt asymptomatic and had not taken BP medication today. Pt states that he will take his BP medication after he eats. Pt states he can check his BP at home and it is normally 130/90s when he checks it. Pt educated to continue monitoring BP at home and if he notices his systolic BP consistently being elevated to notify his PCP. Pt verbalized understanding. Edmonia Caprio, PA notified of BP prior to pt leaving PAT.  Patient denies shortness of breath, fever, cough and chest pain at PAT appointment   All instructions explained to the patient, with a verbal understanding of the material. Patient agrees to go over the instructions while at home for a better understanding.  The opportunity to ask questions was provided.

## 2022-10-10 NOTE — Anesthesia Preprocedure Evaluation (Signed)
Anesthesia Evaluation  Patient identified by MRN, date of birth, ID band Patient awake    Reviewed: Allergy & Precautions, NPO status , Patient's Chart, lab work & pertinent test results  History of Anesthesia Complications Negative for: history of anesthetic complications  Airway Mallampati: II  TM Distance: >3 FB Neck ROM: Full   Comment: Previous grade I view with MAC 4, easy mask with OPA Dental  (+) Dental Advisory Given,    Pulmonary neg pulmonary ROS   Pulmonary exam normal breath sounds clear to auscultation       Cardiovascular hypertension (enalapril), Pt. on medications (-) angina (-) Past MI, (-) Cardiac Stents and (-) CABG (-) dysrhythmias  Rhythm:Regular Rate:Normal  HLD   Neuro/Psych neg Seizures  Neuromuscular disease (lumbar radiculopathy)    GI/Hepatic negative GI ROS, Neg liver ROS,,,  Endo/Other  diabetes (Hgb A1c 7.6), Poorly Controlled, Type 2, Oral Hypoglycemic Agents    Renal/GU negative Renal ROS     Musculoskeletal  (+) Arthritis ,    Abdominal   Peds  Hematology negative hematology ROS (+)   Anesthesia Other Findings Last Trulicity: 10/03/2022  Reproductive/Obstetrics                             Anesthesia Physical Anesthesia Plan  ASA: 3  Anesthesia Plan: General   Post-op Pain Management: Tylenol PO (pre-op)*, Ketamine IV* and Dilaudid IV   Induction: Intravenous  PONV Risk Score and Plan: 2 and Ondansetron, Dexamethasone and Treatment may vary due to age or medical condition  Airway Management Planned: Oral ETT  Additional Equipment:   Intra-op Plan:   Post-operative Plan: Extubation in OR  Informed Consent: I have reviewed the patients History and Physical, chart, labs and discussed the procedure including the risks, benefits and alternatives for the proposed anesthesia with the patient or authorized representative who has indicated his/her  understanding and acceptance.     Dental advisory given  Plan Discussed with: CRNA and Anesthesiologist  Anesthesia Plan Comments: (Risks of general anesthesia discussed including, but not limited to, sore throat, hoarse voice, chipped/damaged teeth, injury to vocal cords, nausea and vomiting, allergic reactions, lung infection, heart attack, stroke, and death. All questions answered. )       Anesthesia Quick Evaluation

## 2022-10-11 ENCOUNTER — Inpatient Hospital Stay (HOSPITAL_COMMUNITY): Payer: Worker's Compensation | Admitting: Physician Assistant

## 2022-10-11 ENCOUNTER — Other Ambulatory Visit: Payer: Self-pay

## 2022-10-11 ENCOUNTER — Inpatient Hospital Stay (HOSPITAL_COMMUNITY)
Admission: RE | Admit: 2022-10-11 | Discharge: 2022-10-12 | DRG: 460 | Disposition: A | Discharge status: Home / Self Care | Payer: Worker's Compensation | Attending: Neurological Surgery | Admitting: Neurological Surgery

## 2022-10-11 ENCOUNTER — Inpatient Hospital Stay (HOSPITAL_COMMUNITY): Payer: 59

## 2022-10-11 ENCOUNTER — Inpatient Hospital Stay (HOSPITAL_COMMUNITY)
Admission: RE | Disposition: A | Discharge status: Home / Self Care | Payer: Self-pay | Source: Home / Self Care | Attending: Neurological Surgery

## 2022-10-11 DIAGNOSIS — I10 Essential (primary) hypertension: Secondary | ICD-10-CM | POA: Diagnosis present

## 2022-10-11 DIAGNOSIS — T84216A Breakdown (mechanical) of internal fixation device of vertebrae, initial encounter: Secondary | ICD-10-CM | POA: Diagnosis present

## 2022-10-11 DIAGNOSIS — E119 Type 2 diabetes mellitus without complications: Secondary | ICD-10-CM | POA: Diagnosis not present

## 2022-10-11 DIAGNOSIS — E1165 Type 2 diabetes mellitus with hyperglycemia: Secondary | ICD-10-CM | POA: Diagnosis present

## 2022-10-11 DIAGNOSIS — Z7984 Long term (current) use of oral hypoglycemic drugs: Secondary | ICD-10-CM

## 2022-10-11 DIAGNOSIS — M1990 Unspecified osteoarthritis, unspecified site: Secondary | ICD-10-CM | POA: Diagnosis present

## 2022-10-11 DIAGNOSIS — Y838 Other surgical procedures as the cause of abnormal reaction of the patient, or of later complication, without mention of misadventure at the time of the procedure: Secondary | ICD-10-CM | POA: Diagnosis present

## 2022-10-11 DIAGNOSIS — Z807 Family history of other malignant neoplasms of lymphoid, hematopoietic and related tissues: Secondary | ICD-10-CM | POA: Diagnosis not present

## 2022-10-11 DIAGNOSIS — S32009K Unspecified fracture of unspecified lumbar vertebra, subsequent encounter for fracture with nonunion: Principal | ICD-10-CM | POA: Diagnosis present

## 2022-10-11 DIAGNOSIS — M960 Pseudarthrosis after fusion or arthrodesis: Secondary | ICD-10-CM

## 2022-10-11 DIAGNOSIS — S3210XK Unspecified fracture of sacrum, subsequent encounter for fracture with nonunion: Secondary | ICD-10-CM

## 2022-10-11 DIAGNOSIS — Z801 Family history of malignant neoplasm of trachea, bronchus and lung: Secondary | ICD-10-CM | POA: Diagnosis not present

## 2022-10-11 DIAGNOSIS — Z806 Family history of leukemia: Secondary | ICD-10-CM | POA: Diagnosis not present

## 2022-10-11 LAB — GLUCOSE, CAPILLARY
Glucose-Capillary: 129 mg/dL — ABNORMAL HIGH (ref 70–99)
Glucose-Capillary: 186 mg/dL — ABNORMAL HIGH (ref 70–99)
Glucose-Capillary: 155 mg/dL — ABNORMAL HIGH (ref 70–99)
Glucose-Capillary: 139 mg/dL — ABNORMAL HIGH (ref 70–99)
Glucose-Capillary: 195 mg/dL — ABNORMAL HIGH (ref 70–99)

## 2022-10-11 LAB — TYPE AND SCREEN
ABO/RH(D): O POS
Antibody Screen: NEGATIVE

## 2022-10-11 SURGERY — POSTERIOR LUMBAR FUSION 2 WITH HARDWARE REMOVAL
Anesthesia: General | Site: Back

## 2022-10-11 MED ORDER — FENTANYL CITRATE (PF) 250 MCG/5ML IJ SOLN
INTRAMUSCULAR | Status: AC
  Filled 2022-10-11: qty 5

## 2022-10-11 MED ORDER — ROCURONIUM BROMIDE 10 MG/ML (PF) SYRINGE
PREFILLED_SYRINGE | INTRAVENOUS | Status: AC
  Filled 2022-10-11: qty 10

## 2022-10-11 MED ORDER — OXYCODONE HCL 5 MG PO TABS
10.0000 mg | ORAL_TABLET | ORAL | Status: DC | PRN
  Administered 2022-10-11 – 2022-10-12 (×5): 10 mg via ORAL
  Filled 2022-10-11 (×5): qty 2

## 2022-10-11 MED ORDER — OXYCODONE HCL 5 MG PO TABS: 5.0000 mg | ORAL_TABLET | ORAL | Status: DC | PRN

## 2022-10-11 MED ORDER — THROMBIN 5000 UNITS EX SOLR
OROMUCOSAL | Status: DC | PRN
  Administered 2022-10-11: 5 mL via TOPICAL

## 2022-10-11 MED ORDER — ALLOPURINOL 100 MG PO TABS: 100.0000 mg | ORAL_TABLET | ORAL | Status: DC | PRN

## 2022-10-11 MED ORDER — LIDOCAINE 2% (20 MG/ML) 5 ML SYRINGE
INTRAMUSCULAR | Status: DC | PRN
  Administered 2022-10-11: 60 mg via INTRAVENOUS

## 2022-10-11 MED ORDER — CHLORHEXIDINE GLUCONATE CLOTH 2 % EX PADS: 6.0000 | MEDICATED_PAD | Freq: Once | CUTANEOUS | Status: DC

## 2022-10-11 MED ORDER — CYCLOBENZAPRINE HCL 10 MG PO TABS
10.0000 mg | ORAL_TABLET | Freq: Three times a day (TID) | ORAL | Status: DC | PRN
  Administered 2022-10-11 (×2): 10 mg via ORAL
  Filled 2022-10-11 (×2): qty 1

## 2022-10-11 MED ORDER — ONDANSETRON HCL 4 MG/2ML IJ SOLN
INTRAMUSCULAR | Status: AC
  Filled 2022-10-11: qty 4

## 2022-10-11 MED ORDER — MIDAZOLAM HCL 2 MG/2ML IJ SOLN
INTRAMUSCULAR | Status: DC | PRN
  Administered 2022-10-11: 2 mg via INTRAVENOUS

## 2022-10-11 MED ORDER — HYDROMORPHONE HCL 1 MG/ML IJ SOLN: 1.0000 mg | INTRAMUSCULAR | Status: DC | PRN

## 2022-10-11 MED ORDER — LIDOCAINE 2% (20 MG/ML) 5 ML SYRINGE
INTRAMUSCULAR | Status: AC
  Filled 2022-10-11: qty 5

## 2022-10-11 MED ORDER — LACTATED RINGERS IV SOLN: INTRAVENOUS | Status: DC | PRN

## 2022-10-11 MED ORDER — SODIUM CHLORIDE 0.9% FLUSH: 3.0000 mL | INTRAVENOUS | Status: DC | PRN

## 2022-10-11 MED ORDER — ACETAMINOPHEN 500 MG PO TABS
1000.0000 mg | ORAL_TABLET | Freq: Once | ORAL | Status: AC
  Administered 2022-10-11: 1000 mg via ORAL
  Filled 2022-10-11: qty 2

## 2022-10-11 MED ORDER — PHENOL 1.4 % MT LIQD: 1.0000 | OROMUCOSAL | Status: DC | PRN

## 2022-10-11 MED ORDER — MIDAZOLAM HCL 2 MG/2ML IJ SOLN
INTRAMUSCULAR | Status: AC
  Filled 2022-10-11: qty 2

## 2022-10-11 MED ORDER — KETAMINE HCL 10 MG/ML IJ SOLN
INTRAMUSCULAR | Status: DC | PRN
  Administered 2022-10-11: 10 mg via INTRAVENOUS
  Administered 2022-10-11: 40 mg via INTRAVENOUS

## 2022-10-11 MED ORDER — KETAMINE HCL 50 MG/5ML IJ SOSY
PREFILLED_SYRINGE | INTRAMUSCULAR | Status: AC
  Filled 2022-10-11: qty 5

## 2022-10-11 MED ORDER — LIDOCAINE-EPINEPHRINE 1 %-1:100000 IJ SOLN
INTRAMUSCULAR | Status: DC | PRN
  Administered 2022-10-11: 10 mL

## 2022-10-11 MED ORDER — SODIUM CHLORIDE 0.9% FLUSH: 3.0000 mL | Freq: Two times a day (BID) | INTRAVENOUS | Status: DC

## 2022-10-11 MED ORDER — PHENYLEPHRINE HCL-NACL 20-0.9 MG/250ML-% IV SOLN
INTRAVENOUS | Status: AC
  Filled 2022-10-11: qty 250

## 2022-10-11 MED ORDER — CEFAZOLIN SODIUM-DEXTROSE 2-4 GM/100ML-% IV SOLN
2.0000 g | INTRAVENOUS | Status: AC
  Administered 2022-10-11: 2 g via INTRAVENOUS
  Filled 2022-10-11: qty 100

## 2022-10-11 MED ORDER — PROPOFOL 10 MG/ML IV BOLUS
INTRAVENOUS | Status: AC
  Filled 2022-10-11: qty 20

## 2022-10-11 MED ORDER — ENALAPRIL MALEATE 10 MG PO TABS
40.0000 mg | ORAL_TABLET | Freq: Every day | ORAL | Status: DC
  Administered 2022-10-11 – 2022-10-12 (×2): 40 mg via ORAL
  Filled 2022-10-11 (×3): qty 4

## 2022-10-11 MED ORDER — ACETAMINOPHEN 650 MG RE SUPP: 650.0000 mg | RECTAL | Status: DC | PRN

## 2022-10-11 MED ORDER — FENTANYL CITRATE (PF) 250 MCG/5ML IJ SOLN
INTRAMUSCULAR | Status: DC | PRN
  Administered 2022-10-11: 100 ug via INTRAVENOUS

## 2022-10-11 MED ORDER — DEXAMETHASONE SODIUM PHOSPHATE 10 MG/ML IJ SOLN
INTRAMUSCULAR | Status: DC | PRN
  Administered 2022-10-11: 10 mg via INTRAVENOUS

## 2022-10-11 MED ORDER — OXYCODONE HCL 5 MG PO TABS: 5.0000 mg | ORAL_TABLET | Freq: Once | ORAL | Status: DC | PRN

## 2022-10-11 MED ORDER — CEFAZOLIN SODIUM-DEXTROSE 2-4 GM/100ML-% IV SOLN
2.0000 g | Freq: Three times a day (TID) | INTRAVENOUS | Status: AC
  Administered 2022-10-11 (×2): 2 g via INTRAVENOUS
  Filled 2022-10-11 (×2): qty 100

## 2022-10-11 MED ORDER — CHLORHEXIDINE GLUCONATE 0.12 % MT SOLN
15.0000 mL | Freq: Once | OROMUCOSAL | Status: AC
  Administered 2022-10-11: 15 mL via OROMUCOSAL
  Filled 2022-10-11: qty 15

## 2022-10-11 MED ORDER — EPHEDRINE 5 MG/ML INJ
INTRAVENOUS | Status: AC
  Filled 2022-10-11: qty 5

## 2022-10-11 MED ORDER — HYDROMORPHONE HCL 1 MG/ML IJ SOLN
INTRAMUSCULAR | Status: AC
  Filled 2022-10-11: qty 0.5

## 2022-10-11 MED ORDER — INSULIN ASPART 100 UNIT/ML IJ SOLN
0.0000 [IU] | Freq: Three times a day (TID) | INTRAMUSCULAR | Status: DC
  Administered 2022-10-12: 3 [IU] via SUBCUTANEOUS

## 2022-10-11 MED ORDER — PHENYLEPHRINE 80 MCG/ML (10ML) SYRINGE FOR IV PUSH (FOR BLOOD PRESSURE SUPPORT)
PREFILLED_SYRINGE | INTRAVENOUS | Status: AC
  Filled 2022-10-11: qty 10

## 2022-10-11 MED ORDER — AMISULPRIDE (ANTIEMETIC) 5 MG/2ML IV SOLN: 10.0000 mg | Freq: Once | INTRAVENOUS | Status: DC | PRN

## 2022-10-11 MED ORDER — SODIUM CHLORIDE 0.9 % IV SOLN
250.0000 mL | INTRAVENOUS | Status: DC
  Administered 2022-10-11: 250 mL via INTRAVENOUS

## 2022-10-11 MED ORDER — DOCUSATE SODIUM 100 MG PO CAPS
100.0000 mg | ORAL_CAPSULE | Freq: Two times a day (BID) | ORAL | Status: DC
  Administered 2022-10-11 – 2022-10-12 (×3): 100 mg via ORAL
  Filled 2022-10-11 (×3): qty 1

## 2022-10-11 MED ORDER — ATORVASTATIN CALCIUM 40 MG PO TABS: 40.0000 mg | ORAL_TABLET | Freq: Every day | ORAL | Status: DC

## 2022-10-11 MED ORDER — METFORMIN HCL ER 500 MG PO TB24
1000.0000 mg | ORAL_TABLET | Freq: Every day | ORAL | Status: DC
  Administered 2022-10-12: 1000 mg via ORAL
  Filled 2022-10-11: qty 2

## 2022-10-11 MED ORDER — ACETAMINOPHEN 325 MG PO TABS: 650.0000 mg | ORAL_TABLET | ORAL | Status: DC | PRN

## 2022-10-11 MED ORDER — MENTHOL 3 MG MT LOZG: 1.0000 | LOZENGE | OROMUCOSAL | Status: DC | PRN

## 2022-10-11 MED ORDER — ORAL CARE MOUTH RINSE: 15.0000 mL | Freq: Once | OROMUCOSAL | Status: AC

## 2022-10-11 MED ORDER — PROPOFOL 10 MG/ML IV BOLUS
INTRAVENOUS | Status: DC | PRN
  Administered 2022-10-11: 160 mg via INTRAVENOUS

## 2022-10-11 MED ORDER — ONDANSETRON HCL 4 MG/2ML IJ SOLN
INTRAMUSCULAR | Status: DC | PRN
  Administered 2022-10-11: 4 mg via INTRAVENOUS

## 2022-10-11 MED ORDER — INSULIN ASPART 100 UNIT/ML IJ SOLN: 0.0000 [IU] | Freq: Every day | INTRAMUSCULAR | Status: DC

## 2022-10-11 MED ORDER — ROCURONIUM BROMIDE 10 MG/ML (PF) SYRINGE
PREFILLED_SYRINGE | INTRAVENOUS | Status: DC | PRN
  Administered 2022-10-11: 40 mg via INTRAVENOUS
  Administered 2022-10-11: 60 mg via INTRAVENOUS

## 2022-10-11 MED ORDER — ACETAMINOPHEN 10 MG/ML IV SOLN
INTRAVENOUS | Status: AC
  Filled 2022-10-11: qty 100

## 2022-10-11 MED ORDER — THROMBIN 5000 UNITS EX SOLR
CUTANEOUS | Status: AC
  Filled 2022-10-11: qty 5000

## 2022-10-11 MED ORDER — PHENYLEPHRINE HCL-NACL 20-0.9 MG/250ML-% IV SOLN
INTRAVENOUS | Status: DC | PRN
  Administered 2022-10-11: 25 ug/min via INTRAVENOUS

## 2022-10-11 MED ORDER — ONDANSETRON HCL 4 MG/2ML IJ SOLN: 4.0000 mg | Freq: Four times a day (QID) | INTRAMUSCULAR | Status: DC | PRN

## 2022-10-11 MED ORDER — LACTATED RINGERS IV SOLN: INTRAVENOUS | Status: DC

## 2022-10-11 MED ORDER — HYDROMORPHONE HCL 1 MG/ML IJ SOLN: 0.2500 mg | INTRAMUSCULAR | Status: DC | PRN

## 2022-10-11 MED ORDER — DEXAMETHASONE SODIUM PHOSPHATE 10 MG/ML IJ SOLN
INTRAMUSCULAR | Status: AC
  Filled 2022-10-11: qty 2

## 2022-10-11 MED ORDER — POLYETHYLENE GLYCOL 3350 17 G PO PACK: 17.0000 g | PACK | Freq: Every day | ORAL | Status: DC | PRN

## 2022-10-11 MED ORDER — 0.9 % SODIUM CHLORIDE (POUR BTL) OPTIME
TOPICAL | Status: DC | PRN
  Administered 2022-10-11: 1000 mL

## 2022-10-11 MED ORDER — SUGAMMADEX SODIUM 200 MG/2ML IV SOLN
INTRAVENOUS | Status: DC | PRN
  Administered 2022-10-11: 200 mg via INTRAVENOUS

## 2022-10-11 MED ORDER — DULAGLUTIDE 1.5 MG/0.5ML ~~LOC~~ SOAJ: 1.5000 mg | SUBCUTANEOUS | Status: DC

## 2022-10-11 MED ORDER — INSULIN ASPART 100 UNIT/ML IJ SOLN
0.0000 [IU] | Freq: Three times a day (TID) | INTRAMUSCULAR | Status: DC
  Administered 2022-10-11: 2 [IU] via SUBCUTANEOUS
  Administered 2022-10-11: 3 [IU] via SUBCUTANEOUS

## 2022-10-11 MED ORDER — LIDOCAINE-EPINEPHRINE 1 %-1:100000 IJ SOLN
INTRAMUSCULAR | Status: AC
  Filled 2022-10-11: qty 1

## 2022-10-11 MED ORDER — OXYCODONE HCL 5 MG/5ML PO SOLN: 5.0000 mg | Freq: Once | ORAL | Status: DC | PRN

## 2022-10-11 MED ORDER — PROPOFOL 1000 MG/100ML IV EMUL
INTRAVENOUS | Status: AC
  Filled 2022-10-11: qty 200

## 2022-10-11 MED ORDER — ONDANSETRON HCL 4 MG PO TABS: 4.0000 mg | ORAL_TABLET | Freq: Four times a day (QID) | ORAL | Status: DC | PRN

## 2022-10-11 MED ORDER — PHENYLEPHRINE 80 MCG/ML (10ML) SYRINGE FOR IV PUSH (FOR BLOOD PRESSURE SUPPORT)
PREFILLED_SYRINGE | INTRAVENOUS | Status: DC | PRN
  Administered 2022-10-11: 80 ug via INTRAVENOUS
  Administered 2022-10-11: 160 ug via INTRAVENOUS
  Administered 2022-10-11: 80 ug via INTRAVENOUS

## 2022-10-11 SURGICAL SUPPLY — 83 items
ADH SKN CLS APL DERMABOND .7 (GAUZE/BANDAGES/DRESSINGS) ×4
APL SKNCLS STERI-STRIP NONHPOA (GAUZE/BANDAGES/DRESSINGS)
BAG COUNTER SPONGE SURGICOUNT (BAG) ×2 IMPLANT
BAG SPNG CNTER NS LX DISP (BAG) ×2
BASKET BONE COLLECTION (BASKET) ×2 IMPLANT
BENZOIN TINCTURE PRP APPL 2/3 (GAUZE/BANDAGES/DRESSINGS) IMPLANT
BLADE BONE MILL MEDIUM (MISCELLANEOUS) IMPLANT
BLADE CLIPPER SURG (BLADE) IMPLANT
BLADE SURG 11 STRL SS (BLADE) ×2 IMPLANT
BUR 14 MATCH 3 (BUR) IMPLANT
BUR MATCHSTICK NEURO 3.0 LAGG (BURR) ×2 IMPLANT
BUR MR8 14CM BALL SYMTRI 5 (BUR) IMPLANT
BUR PRECISION FLUTE 5.0 (BURR) ×2 IMPLANT
BURR 14 MATCH 3 (BUR) ×2
BURR MR8 14CM BALL SYMTRI 5 (BUR)
CANISTER SUCT 3000ML PPV (MISCELLANEOUS) ×2 IMPLANT
CNTNR URN SCR LID CUP LEK RST (MISCELLANEOUS) ×2 IMPLANT
CONT SPEC 4OZ STRL OR WHT (MISCELLANEOUS) ×2
COVER BACK TABLE 60X90IN (DRAPES) ×2 IMPLANT
COVERAGE SUPPORT O-ARM STEALTH (MISCELLANEOUS) ×2 IMPLANT
DERMABOND ADVANCED .7 DNX12 (GAUZE/BANDAGES/DRESSINGS) ×2 IMPLANT
DRAPE C-ARM 42X72 X-RAY (DRAPES) IMPLANT
DRAPE C-ARMOR (DRAPES) IMPLANT
DRAPE LAPAROTOMY 100X72X124 (DRAPES) ×2 IMPLANT
DRAPE SHEET LG 3/4 BI-LAMINATE (DRAPES) ×8 IMPLANT
DRAPE SURG 17X23 STRL (DRAPES) ×2 IMPLANT
DURAPREP 26ML APPLICATOR (WOUND CARE) ×2 IMPLANT
ELECT REM PT RETURN 9FT ADLT (ELECTROSURGICAL) ×2
ELECTRODE REM PT RTRN 9FT ADLT (ELECTROSURGICAL) ×2 IMPLANT
FEE COVERAGE SUPPORT O-ARM (MISCELLANEOUS) ×2 IMPLANT
GAUZE 4X4 16PLY ~~LOC~~+RFID DBL (SPONGE) IMPLANT
GAUZE SPONGE 4X4 12PLY STRL (GAUZE/BANDAGES/DRESSINGS) IMPLANT
GLOVE BIO SURGEON STRL SZ7.5 (GLOVE) ×4 IMPLANT
GLOVE BIOGEL PI IND STRL 7.0 (GLOVE) ×4 IMPLANT
GLOVE BIOGEL PI IND STRL 7.5 (GLOVE) ×4 IMPLANT
GLOVE ECLIPSE 7.5 STRL STRAW (GLOVE) ×4 IMPLANT
GLOVE EXAM NITRILE LRG STRL (GLOVE) IMPLANT
GLOVE EXAM NITRILE XL STR (GLOVE) IMPLANT
GLOVE EXAM NITRILE XS STR PU (GLOVE) IMPLANT
GOWN STRL REUS W/ TWL LRG LVL3 (GOWN DISPOSABLE) ×8 IMPLANT
GOWN STRL REUS W/ TWL XL LVL3 (GOWN DISPOSABLE) IMPLANT
GOWN STRL REUS W/TWL 2XL LVL3 (GOWN DISPOSABLE) IMPLANT
GOWN STRL REUS W/TWL LRG LVL3 (GOWN DISPOSABLE) ×8
GOWN STRL REUS W/TWL XL LVL3 (GOWN DISPOSABLE)
GRAFT BN 5X1XSPNE CVD POST DBM (Bone Implant) IMPLANT
GRAFT BONE MAGNIFUSE 1X5CM (Bone Implant) ×2 IMPLANT
HEMOSTAT POWDER KIT SURGIFOAM (HEMOSTASIS) ×2 IMPLANT
KIT BASIN OR (CUSTOM PROCEDURE TRAY) ×2 IMPLANT
KIT INFUSE SMALL (Orthopedic Implant) IMPLANT
KIT POSITION SURG JACKSON T1 (MISCELLANEOUS) ×2 IMPLANT
KIT TURNOVER KIT B (KITS) ×2 IMPLANT
MARKER SPHERE PSV REFLC NDI (MISCELLANEOUS) ×10 IMPLANT
MILL BONE PREP (MISCELLANEOUS) ×2 IMPLANT
NDL HYPO 18GX1.5 BLUNT FILL (NEEDLE) IMPLANT
NDL HYPO 22X1.5 SAFETY MO (MISCELLANEOUS) ×2 IMPLANT
NDL SPNL 18GX3.5 QUINCKE PK (NEEDLE) IMPLANT
NEEDLE HYPO 18GX1.5 BLUNT FILL (NEEDLE) IMPLANT
NEEDLE HYPO 22X1.5 SAFETY MO (MISCELLANEOUS) ×2 IMPLANT
NEEDLE SPNL 18GX3.5 QUINCKE PK (NEEDLE) IMPLANT
NS IRRIG 1000ML POUR BTL (IV SOLUTION) ×2 IMPLANT
PACK LAMINECTOMY NEURO (CUSTOM PROCEDURE TRAY) ×2 IMPLANT
PAD ARMBOARD 7.5X6 YLW CONV (MISCELLANEOUS) ×6 IMPLANT
PIN BONE FIX 100 (PIN) IMPLANT
ROD SOLERA 80MM (Rod) ×4 IMPLANT
ROD SOLERA 80X5.5XNS TI (Rod) IMPLANT
SCREW CANN MA SOLERA 8.5X70 (Screw) IMPLANT
SCREW SET SOLERA (Screw) ×12 IMPLANT
SCREW SET SOLERA TI5.5 (Screw) IMPLANT
SOL ELECTROSURG ANTI STICK (MISCELLANEOUS)
SOLUTION ELECTROSURG ANTI STCK (MISCELLANEOUS) ×4 IMPLANT
SPIKE FLUID TRANSFER (MISCELLANEOUS) ×2 IMPLANT
SPONGE SURGIFOAM ABS GEL 100 (HEMOSTASIS) IMPLANT
SPONGE T-LAP 4X18 ~~LOC~~+RFID (SPONGE) IMPLANT
STRIP CLOSURE SKIN 1/2X4 (GAUZE/BANDAGES/DRESSINGS) IMPLANT
SUT MNCRL AB 3-0 PS2 18 (SUTURE) ×2 IMPLANT
SUT VIC AB 0 CT1 18XCR BRD8 (SUTURE) ×2 IMPLANT
SUT VIC AB 0 CT1 8-18 (SUTURE) ×2
SUT VIC AB 2-0 CP2 18 (SUTURE) ×2 IMPLANT
SYR 3ML LL SCALE MARK (SYRINGE) IMPLANT
TOWEL GREEN STERILE (TOWEL DISPOSABLE) ×2 IMPLANT
TOWEL GREEN STERILE FF (TOWEL DISPOSABLE) ×2 IMPLANT
TRAY FOLEY MTR SLVR 16FR STAT (SET/KITS/TRAYS/PACK) ×2 IMPLANT
WATER STERILE IRR 1000ML POUR (IV SOLUTION) ×2 IMPLANT

## 2022-10-11 NOTE — Anesthesia Procedure Notes (Addendum)
Procedure Name: Intubation Date/Time: 10/11/2022 7:51 AM  Performed by: Alvera Novel, CRNAPre-anesthesia Checklist: Patient identified, Emergency Drugs available, Suction available, Patient being monitored and Timeout performed Patient Re-evaluated:Patient Re-evaluated prior to induction Oxygen Delivery Method: Circle system utilized Preoxygenation: Pre-oxygenation with 100% oxygen Induction Type: IV induction Ventilation: Oral airway inserted - appropriate to patient size Laryngoscope Size: Mac and 4 Grade View: Grade I Tube type: Oral Tube size: 7.5 mm Number of attempts: 1 Airway Equipment and Method: Stylet Placement Confirmation: ETT inserted through vocal cords under direct vision, positive ETCO2, breath sounds checked- equal and bilateral and CO2 detector Secured at: 23 cm Tube secured with: Tape Dental Injury: Teeth and Oropharynx as per pre-operative assessment

## 2022-10-11 NOTE — Op Note (Signed)
PATIENT: Ryan Rhodes  DAY OF SURGERY: 10/11/22   PRE-OPERATIVE DIAGNOSIS:  Lumbar pseudoarthrosis with hardware fracture   POST-OPERATIVE DIAGNOSIS:  Same   PROCEDURE:  Revision of L5-S1 instrumented fusion with placement of bilateral S2AI screws, posterolateral L5-S1 instrumented fusion   SURGEON:  Surgeon(s) and Role:    Jadene Pierini, MD    Emilee Hero PA   ANESTHESIA: ETGA   BRIEF HISTORY: This is a 62 year old man in whom I previously performed an L4-S1 MIS TLIF. He re-presented with recurrent back pain, repeat imaging showed pseudoarthrosis at L5-S1 with fracture of the bilateral S1 screws. I therefore recommended surgical revision.    OPERATIVE DETAIL: The patient was taken to the operating room and anesthesia was induced by the anesthesia team. They were placed on the OR table in the prone position with padding of all pressure points. A formal time out was performed with two patient identifiers and confirmed the operative site. The operative site was marked, hair was clipped with surgical clippers, the area was then prepped and draped in a sterile fashion. The prior existing bilateral incisions were used. A percutaneous stereotaxy pin was placed in the pelvis via a small stab incision. The prior MIS incisions were opened and subperiosteal dissection was performed to expose the existing hardware as well as the entry sites for S2AI screws. The caps and rods were removed to minimize artifact. The bilateral S1 screw heads were removed as well, these were clearly fractured and removed without a driver. The screw shafts were buried and unable to be removed, as usual.   The field was draped and the O-arm was brought into the field. An intra-op CT was performed, sent to the Stealth navigation station, registered to the patient's anatomy, and confirmed with landmarks with acceptable fit. Stereotactic spinal navigation was utilized throughout the procedure for planning and  placement of pedicle screw trajectories.  Instrumentation was then performed. Frameless stereotaxy was used to guide placement of bilateral screws ([employer]) at S2AI. These were placed with navigated instruments, drilling a pilot hole, cannulating the pedicle with an awl-tap, palpating for pedicle wall breaches, and then placing the screw.   The posterolateral facets were decorticated, allograft was placed, and rBMP was placed along the posterolateral fusion surfaces.  Rods were then placed bilaterally, followed by caps, and final tightened according to manufacturer torque specifications.   Alli Consentino PA was scrubbed and assisted with the entire procedure which included exposure, instrumentation and closure.  All instrument and sponge counts were correct, the incision was then closed in layers. The patient was then returned to anesthesia for emergence. No apparent complications at the completion of the procedure.   EBL:    DRAINS: none   SPECIMENS: none   Jadene Pierini, MD

## 2022-10-11 NOTE — Transfer of Care (Signed)
Immediate Anesthesia Transfer of Care Note  Patient: Ryan Rhodes  Procedure(s) Performed: Open revision of Lumbar four to Sacral one Posterior spinal fusion with Sacral two alar iliac screws, removal of hardware (Back) Application of O-Arm  Patient Location: PACU  Anesthesia Type:General  Level of Consciousness: drowsy and patient cooperative  Airway & Oxygen Therapy: Patient Spontanous Breathing and Patient connected to face mask oxygen  Post-op Assessment: Report given to RN and Post -op Vital signs reviewed and stable  Post vital signs: Reviewed and stable  Last Vitals:  Vitals Value Taken Time  BP 139/102 10/11/22 1038  Temp    Pulse 95 10/11/22 1041  Resp 16 10/11/22 1041  SpO2 92 % 10/11/22 1041  Vitals shown include unvalidated device data.  Last Pain:  Vitals:   10/11/22 0630  TempSrc:   PainSc: 0-No pain         Complications: There were no known notable events for this encounter.

## 2022-10-11 NOTE — Anesthesia Postprocedure Evaluation (Signed)
Anesthesia Post Note  Patient: Ryan Rhodes  Procedure(s) Performed: Open revision of Lumbar four to Sacral one Posterior spinal fusion with Sacral two alar iliac screws, removal of hardware (Back) Application of O-Arm     Patient location during evaluation: PACU Anesthesia Type: General Level of consciousness: awake Pain management: pain level controlled Vital Signs Assessment: post-procedure vital signs reviewed and stable Respiratory status: spontaneous breathing, nonlabored ventilation and respiratory function stable Cardiovascular status: blood pressure returned to baseline and stable Postop Assessment: no apparent nausea or vomiting Anesthetic complications: no   There were no known notable events for this encounter.  Last Vitals:  Vitals:   10/11/22 1115 10/11/22 1144  BP: (!) 134/100 (!) 148/110  Pulse: 90 99  Resp: 16 16  Temp: 36.6 C 36.7 C  SpO2: 92% 97%    Last Pain:  Vitals:   10/11/22 1144  TempSrc: Oral  PainSc: 7                  Linton Rump

## 2022-10-11 NOTE — H&P (Signed)
Surgical H&P Update  HPI: 62 y.o. with a history of back pain with pseudoarthrosis and lumbar hardware fracture. No changes in health since they were last seen. Still having the above and wishes to proceed with surgery.  PMHx:  Past Medical History:  Diagnosis Date   Arthritis    gout   Diabetes mellitus (HCC)    type 2   Gout    Gout    Hypertension    FamHx:  Family History  Problem Relation Age of Onset   Leukemia Mother    Lung cancer Father    Multiple myeloma Brother    Colon cancer Neg Hx    SocHx:  reports that he has never smoked. He has never used smokeless tobacco. He reports current alcohol use of about 8.0 - 9.0 standard drinks of alcohol per week. He reports that he does not currently use drugs after having used the following drugs: Marijuana.  Physical Exam: Strength 5/5 x4 and SILTx4   Assesment/Plan: 62 y.o. man with prior L4-S1 with hardware fracture, here for revision, replacement of broken hardware, and extension to pelvis via S2AI. Risks, benefits, and alternatives discussed and the patient would like to continue with surgery.  -OR today -3C post-op  Jadene Pierini, MD 10/11/22 7:33 AM

## 2022-10-12 LAB — GLUCOSE, CAPILLARY: Glucose-Capillary: 152 mg/dL — ABNORMAL HIGH (ref 70–99)

## 2022-10-12 MED ORDER — CYCLOBENZAPRINE HCL 10 MG PO TABS: 10.0000 mg | ORAL_TABLET | Freq: Three times a day (TID) | ORAL | 0 refills | Status: DC | PRN

## 2022-10-12 MED ORDER — FENTANYL CITRATE (PF) 100 MCG/2ML IJ SOLN
INTRAMUSCULAR | Status: AC
  Filled 2022-10-12: qty 2

## 2022-10-12 MED ORDER — OXYCODONE HCL 10 MG PO TABS: ORAL_TABLET | ORAL | 0 refills | Status: DC

## 2022-10-12 NOTE — Progress Notes (Signed)
Neurosurgery Service Progress Note  Subjective: No acute events overnight. His pre-op back pain is significantly improved and he is feeling great.   Objective: Vitals:   10/11/22 1934 10/11/22 2311 10/12/22 0345 10/12/22 0751  BP: (!) 151/99 126/89 121/70 122/84  Pulse: 97 (!) 104 (!) 102 99  Resp: 18 18 18 20   Temp: 99.2 F (37.3 C) 98.6 F (37 C) 98.9 F (37.2 C) 98.2 F (36.8 C)  TempSrc: Oral Oral Oral Oral  SpO2: 98% 96% 98% 97%  Weight:      Height:        Physical Exam: Strength 5/5 x4 and SILTx4, incision c/d/I   Assessment & Plan: 62 y.o. male s/p revision of L5-S1 instrumented fusion with placement of bilateral S2AI screws, recovering well.  -PT/OT -discharge home today   Emilee Hero, PA-C 10/12/22 9:32 AM

## 2022-10-12 NOTE — Evaluation (Addendum)
Physical Therapy Evaluation Patient Details Name: Ryan Rhodes MRN: [id number] DOB: October 08, 1960 Today's Date: 10/12/2022  History of Present Illness  62 y.o. male presents to [facility] hospital on 10/11/2022 for L5-S1 PLIF. PMH includes OA, DMII, gout, HTN.  Clinical Impression  Pt presents to PT s/p back surgery mobilizing well. Pt is able to recall back precautions from prior surgery and manages log roll without assistance. Pt ambulates and negotiates stairs independently. PT recommends discharge home, no PT or DME needs at this time. Acute PT signing off.      Assistance Recommended at Discharge PRN  If plan is discharge home, recommend the following:  Can travel by private vehicle  A little help with bathing/dressing/bathroom;Assistance with cooking/housework;Assist for transportation        Equipment Recommendations None recommended by PT  Recommendations for Other Services       Functional Status Assessment Patient has had a recent decline in their functional status and demonstrates the ability to make significant improvements in function in a reasonable and predictable amount of time.     Precautions / Restrictions Precautions Precautions: Back Precaution Booklet Issued: Yes (comment) Restrictions Weight Bearing Restrictions: No      Mobility  Bed Mobility Overal bed mobility: Modified Independent             General bed mobility comments: log roll, sidelying to sitting    Transfers Overall transfer level: Independent Equipment used: None                    Ambulation/Gait Ambulation/Gait assistance: Modified independent (Device/Increase time) Gait Distance (Feet): 300 Feet Assistive device: None Gait Pattern/deviations: Step-through pattern Gait velocity: functional Gait velocity interpretation: 1.31 - 2.62 ft/sec, indicative of limited community ambulator   General Gait Details: slowed step-through gait  Stairs Stairs: Yes Stairs assistance:  Modified independent (Device/Increase time) Stair Management: One rail Right, Alternating pattern Number of Stairs: 10    Wheelchair Mobility     Tilt Bed    Modified Rankin (Stroke Patients Only)       Balance Overall balance assessment: Independent                                           Pertinent Vitals/Pain Pain Assessment Pain Assessment: Faces Faces Pain Scale: Hurts little more Pain Location: back Pain Descriptors / Indicators: Sore Pain Intervention(s): Monitored during session    Home Living Family/patient expects to be discharged to:: Private residence Living Arrangements: Spouse/significant other;Children Available Help at Discharge: Family;Available PRN/intermittently Type of Home: House Home Access: Stairs to enter Entrance Stairs-Rails: None Entrance Stairs-Number of Steps: 2 Alternate Level Stairs-Number of Steps: 14 Home Layout: Two level Home Equipment: Cane - single point;Crutches;Grab bars - tub/shower      Prior Function Prior Level of Function : Independent/Modified Independent;Working/employed;Driving             Mobility Comments: 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       Higher education careers adviser        Extremity/Trunk Assessment   Upper Extremity Assessment Upper Extremity Assessment: Overall WFL for tasks assessed    Lower Extremity Assessment Lower Extremity Assessment: Generalized weakness    Cervical / Trunk Assessment Cervical / Trunk Assessment: Back Surgery  Communication   Communication: No difficulties  Cognition Arousal/Alertness: Awake/alert Behavior During Therapy: WFL for tasks assessed/performed Overall Cognitive Status:  Within Functional Limits for tasks assessed                                          General Comments General comments (skin integrity, edema, etc.): VSS on RA    Exercises     Assessment/Plan    PT Assessment Patient does not  need any further PT services  PT Problem List         PT Treatment Interventions      PT Goals (Current goals can be found in the Care Plan section)       Frequency       Co-evaluation               AM-PAC PT "6 Clicks" Mobility  Outcome Measure Help needed turning from your back to your side while in a flat bed without using bedrails?: None Help needed moving from lying on your back to sitting on the side of a flat bed without using bedrails?: None Help needed moving to and from a bed to a chair (including a wheelchair)?: None Help needed standing up from a chair using your arms (e.g., wheelchair or bedside chair)?: None Help needed to walk in hospital room?: None Help needed climbing 3-5 steps with a railing? : None 6 Click Score: 24    End of Session   Activity Tolerance: Patient tolerated treatment well Patient left: in bed;with family/visitor present Nurse Communication: Mobility status PT Visit Diagnosis: Other abnormalities of gait and mobility (R26.89)    Time: 1610-9604 PT Time Calculation (min) (ACUTE ONLY): 10 min   Charges:   PT Evaluation $PT Eval Low Complexity: 1 Low   PT General Charges $$ ACUTE PT VISIT: 1 Visit         Arlyss Gandy, PT, DPT Acute Rehabilitation Office [phone]   Arlyss Gandy 10/12/2022, 9:55 AM

## 2022-10-12 NOTE — Discharge Summary (Signed)
Discharge Summary  Date of Admission: 10/11/2022  Date of Discharge: 10/12/22  Attending Physician: Autumn Patty, MD  Hospital Course: Patient was admitted following an uncomplicated revision of L5-S1 instrumented fusion with placement of bilateral S2AI screws. They were recovered in PACU and transferred to [facility]. Their preop symptoms were improved, their hospital course was uncomplicated and the patient was discharged home today. They will follow up in clinic with me in clinic in 2 weeks.  Neurologic exam at discharge:  Strength 5/5 x4 and SILTx4, incision c/d/I   Discharge diagnosis: Lumbar pseudoarthrosis with hardware fracture   Iran Sizer, PA-C 10/12/22 9:34 AM

## 2022-10-12 NOTE — Plan of Care (Signed)
Pt doing well. Pt given D/C instructions with verbal understanding. Rx's were sent to the pharmacy by MD. Pt's incision is clean and dry with no sign of infection. Pt's IV was removed prior to D/C. Pt D/C'd home via wheelchair per MD order. Pt is stable @ D/C and has no other needs at this time. Jerney Baksh, RN  

## 2022-10-13 ENCOUNTER — Telehealth: Payer: Self-pay

## 2022-10-13 MED FILL — Fentanyl Citrate Preservative Free (PF) Inj 100 MCG/2ML: INTRAMUSCULAR | Qty: 2 | Status: AC

## 2022-10-13 NOTE — Transitions of Care (Post Inpatient/ED Visit) (Unsigned)
   10/13/2022  Name: LENNOX DOLBERRY MRN: [id number] DOB: 1961/01/26  Today's TOC FU Call Status: Today's TOC FU Call Status:: Unsuccessul Call (1st Attempt) Unsuccessful Call (1st Attempt) Date: 10/13/22  Attempted to reach the patient regarding the most recent Inpatient/ED visit.  Follow Up Plan: Additional outreach attempts will be made to reach the patient to complete the Transitions of Care (Post Inpatient/ED visit) call.   Signature Karena Addison, LPN [facility] Nurse Health Advisor Direct Dial [phone]

## 2022-10-14 NOTE — Transitions of Care (Post Inpatient/ED Visit) (Signed)
10/14/2022  Name: Ryan Rhodes MRN: [id number] DOB: March 27, 1961  Today's TOC FU Call Status: Today's TOC FU Call Status:: Successful TOC FU Call Competed Unsuccessful Call (1st Attempt) Date: 10/13/22 [facility] FU Call Complete Date: 10/14/22  Transition Care Management Follow-up Telephone Call Date of Discharge: 10/12/22 Discharge Facility: [facility]) Type of Discharge: Inpatient Admission Primary Inpatient Discharge Diagnosis:: lumbar fracture How have you been since you were released from the hospital?: Better Any questions or concerns?: No  Items Reviewed: Did you receive and understand the discharge instructions provided?: Yes Medications obtained,verified, and reconciled?: Yes (Medications Reviewed) Any new allergies since your discharge?: No Dietary orders reviewed?: Yes Do you have support at home?: No  Medications Reviewed Today: Medications Reviewed Today     Reviewed by Karena Addison, LPN (Licensed Practical Nurse) on 10/14/22 at 1518  Med List Status: <None>   Medication Order Taking? Sig Documenting Provider Last Dose Status Informant  acetaminophen (TYLENOL) 500 MG tablet [id number] No Take 1,000 mg by mouth every 6 (six) hours as needed for moderate pain. [provider] 10/10/2022 Active Self, Pharmacy Records  allopurinol (ZYLOPRIM) 100 MG tablet [id number] No Take 1 tablet (100 mg total) by mouth daily.  Patient taking differently: Take 100 mg by mouth as needed (gout flair up).   Olive Bass, FNP Past Month Active Self, Pharmacy Records  atorvastatin (LIPITOR) 40 MG tablet [id number] No Take 1 tablet (40 mg total) by mouth daily at 6 PM. [place] Scrape, Allyne Gee, FNP Past Month Active Self, Pharmacy Records  cyclobenzaprine (FLEXERIL) 10 MG tablet [id number]  Take 1 tablet (10 mg total) by mouth 3 (three) times daily as needed for muscle spasms. Iran Sizer, PA-C  Active   Dulaglutide (TRULICITY) 1.5 MG/0.5ML SOPN [id number]  Inject 1.5  mg into the skin once a week. Olive Bass, FNP  Active Self, Pharmacy Records  enalapril (VASOTEC) 20 MG tablet [id number] No TAKE 1 TABLET(20 MG) BY MOUTH TWICE DAILY  Patient taking differently: Take 40 mg by mouth daily.   Olive Bass, FNP Past Week Active Self, Pharmacy Records  indomethacin (INDOCIN) 50 MG capsule [id number] No TAKE 1 CAPSULE BY MOUTH TWICE DAILY AS NEEDED FOR GOUT FLARE.  Patient taking differently: Take 50 mg by mouth as needed (gout flair up).   Olive Bass, FNP Past Week Active Self, Pharmacy Records  metFORMIN (GLUCOPHAGE-XR) 500 MG 24 hr tablet [id number] No Take 1 tablet (500 mg total) by mouth in the morning and at bedtime.  Patient taking differently: Take 1,000 mg by mouth daily.   Olive Bass, FNP 10/03/2022 Active Self, Pharmacy Records  Multiple Vitamins-Minerals (MULTIVITAMIN WITH MINERALS) tablet [id number] No Take 1 tablet by mouth daily. [provider] Past Week Active Self, Pharmacy Records  oxyCODONE 10 MG TABS [id number]  Take 1 tablet by mouth every 4-6 hours as needed for severe pain Cosentino, Talmadge Chad, PA-C  Active             Home Care and Equipment/Supplies: Were Home Health Services Ordered?: NA Any new equipment or medical supplies ordered?: NA  Functional Questionnaire: Do you need assistance with bathing/showering or dressing?: No Do you need assistance with meal preparation?: No Do you need assistance with eating?: No Do you have difficulty maintaining continence: No Do you need assistance with getting out of bed/getting out of a chair/moving?: No Do you have difficulty managing or taking your medications?: No  Follow up appointments reviewed: PCP Follow-up appointment confirmed?: NA  Specialist Hospital Follow-up appointment confirmed?: No Follow-Up Specialty Provider:: ortho Reason Specialist Follow-Up Not Confirmed: Patient has Specialist Provider Number and will Call for  Appointment Do you need transportation to your follow-up appointment?: No Do you understand care options if your condition(s) worsen?: Yes-patient verbalized understanding    SIGNATURE Karena Addison, LPN [facility] Nurse Health Advisor Direct Dial [phone]

## 2022-11-10 ENCOUNTER — Other Ambulatory Visit: Payer: Self-pay | Admitting: Family

## 2022-11-10 DIAGNOSIS — M109 Gout, unspecified: Secondary | ICD-10-CM

## 2023-05-19 ENCOUNTER — Other Ambulatory Visit: Payer: Self-pay | Admitting: Family

## 2023-05-19 ENCOUNTER — Ambulatory Visit: Payer: Commercial Managed Care - PPO | Admitting: Family

## 2023-05-19 ENCOUNTER — Telehealth: Payer: Self-pay | Admitting: Family

## 2023-05-19 ENCOUNTER — Encounter: Payer: Self-pay | Admitting: Family

## 2023-05-19 VITALS — BP 152/100 | HR 127 | Ht 66.0 in

## 2023-05-19 DIAGNOSIS — R600 Localized edema: Secondary | ICD-10-CM

## 2023-05-19 DIAGNOSIS — I10 Essential (primary) hypertension: Secondary | ICD-10-CM

## 2023-05-19 DIAGNOSIS — M109 Gout, unspecified: Secondary | ICD-10-CM | POA: Diagnosis not present

## 2023-05-19 DIAGNOSIS — E119 Type 2 diabetes mellitus without complications: Secondary | ICD-10-CM | POA: Diagnosis not present

## 2023-05-19 LAB — CBC WITH DIFFERENTIAL/PLATELET
Basophils Absolute: 0 10*3/uL (ref 0.0–0.1)
Basophils Relative: 0.5 % (ref 0.0–3.0)
Eosinophils Absolute: 0 10*3/uL (ref 0.0–0.7)
Eosinophils Relative: 0.4 % (ref 0.0–5.0)
HCT: 46.1 % (ref 39.0–52.0)
Hemoglobin: 15.3 g/dL (ref 13.0–17.0)
Lymphocytes Relative: 12.7 % (ref 12.0–46.0)
Lymphs Abs: 1.2 10*3/uL (ref 0.7–4.0)
MCHC: 33.1 g/dL (ref 30.0–36.0)
MCV: 83.9 fL (ref 78.0–100.0)
Monocytes Absolute: 1.2 10*3/uL — ABNORMAL HIGH (ref 0.1–1.0)
Monocytes Relative: 13 % — ABNORMAL HIGH (ref 3.0–12.0)
Neutro Abs: 6.8 10*3/uL (ref 1.4–7.7)
Neutrophils Relative %: 73.4 % (ref 43.0–77.0)
Platelets: 417 10*3/uL — ABNORMAL HIGH (ref 150.0–400.0)
RBC: 5.5 Mil/uL (ref 4.22–5.81)
RDW: 13.7 % (ref 11.5–15.5)
WBC: 9.3 10*3/uL (ref 4.0–10.5)

## 2023-05-19 LAB — COMPREHENSIVE METABOLIC PANEL
ALT: 34 U/L (ref 0–53)
AST: 29 U/L (ref 0–37)
Albumin: 4.7 g/dL (ref 3.5–5.2)
Alkaline Phosphatase: 96 U/L (ref 39–117)
BUN: 7 mg/dL (ref 6–23)
CO2: 30 meq/L (ref 19–32)
Calcium: 10.3 mg/dL (ref 8.4–10.5)
Chloride: 91 meq/L — ABNORMAL LOW (ref 96–112)
Creatinine, Ser: 0.98 mg/dL (ref 0.40–1.50)
GFR: 83.87 mL/min (ref >60.00)
Glucose, Bld: 307 mg/dL — ABNORMAL HIGH (ref 70–99)
Potassium: 4.5 meq/L (ref 3.5–5.1)
Sodium: 131 meq/L — ABNORMAL LOW (ref 135–145)
Total Bilirubin: 0.7 mg/dL (ref 0.2–1.2)
Total Protein: 9.3 g/dL — ABNORMAL HIGH (ref 6.0–8.3)

## 2023-05-19 LAB — HEMOGLOBIN A1C: Hgb A1c MFr Bld: 9.9 % — ABNORMAL HIGH (ref 4.6–6.5)

## 2023-05-19 LAB — URIC ACID: Uric Acid, Serum: 5.2 mg/dL (ref 4.0–7.8)

## 2023-05-19 MED ORDER — HYDROCODONE-ACETAMINOPHEN 5-325 MG PO TABS: 1.0000 | ORAL_TABLET | Freq: Four times a day (QID) | ORAL | 0 refills | Status: DC | PRN

## 2023-05-19 MED ORDER — PREDNISONE 20 MG PO TABS: 20.0000 mg | ORAL_TABLET | Freq: Every day | ORAL | 0 refills | Status: DC

## 2023-05-19 MED ORDER — ENALAPRIL MALEATE 20 MG PO TABS
20.0000 mg | ORAL_TABLET | Freq: Two times a day (BID) | ORAL | 3 refills | Status: DC
  Filled 2023-06-18 – 2023-11-17 (×3): qty 180, 90d supply, fill #0
  Filled 2024-02-10: qty 180, 90d supply, fill #1
  Filled 2024-02-11: qty 60, 30d supply, fill #1
  Filled 2024-02-11: qty 120, 60d supply, fill #1
  Filled 2024-04-05: qty 120, 60d supply, fill #2

## 2023-05-19 NOTE — Telephone Encounter (Signed)
Copied from CRM [id number]. Topic: Clinical - Prescription Issue >> May 19, 2023 11:56 AM Theodis Sato wrote: Reason for CRM:  [facility] pharmacy is requesting prescribing provider to send the correct prescription for HYDROcodone-acetaminophen (NORCO/VICODIN) 5-325 MG tablet as the one sent day is requesting 3020 to be dispensed- pharmacy is assuming the provider meant 30 or 20 but needs clarification and a new prescription sent.
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
## 2023-05-20 ENCOUNTER — Other Ambulatory Visit: Payer: Self-pay | Admitting: Family

## 2023-05-20 ENCOUNTER — Ambulatory Visit (HOSPITAL_COMMUNITY)
Admission: RE | Admit: 2023-05-20 | Discharge: 2023-05-20 | Disposition: A | Discharge status: Home / Self Care | Payer: Commercial Managed Care - PPO | Source: Ambulatory Visit | Attending: Family | Admitting: Family

## 2023-05-20 DIAGNOSIS — E119 Type 2 diabetes mellitus without complications: Secondary | ICD-10-CM

## 2023-05-20 DIAGNOSIS — R600 Localized edema: Secondary | ICD-10-CM

## 2023-05-20 MED ORDER — METFORMIN HCL ER 500 MG PO TB24
500.0000 mg | ORAL_TABLET | Freq: Two times a day (BID) | ORAL | 0 refills | Status: DC
Start: 2023-05-20 — End: 2023-05-20

## 2023-05-20 NOTE — Progress Notes (Signed)
Left lower extremity venous duplex has been completed. Preliminary results can be found in CV Proc through chart review.  Results were faxed to Ria Clock FNP.  05/20/23 4:32 PM Olen Cordial RVT

## 2023-05-24 ENCOUNTER — Other Ambulatory Visit: Payer: Self-pay | Admitting: Family

## 2023-05-24 DIAGNOSIS — M109 Gout, unspecified: Secondary | ICD-10-CM

## 2023-05-25 ENCOUNTER — Other Ambulatory Visit: Payer: Self-pay | Admitting: Family

## 2023-05-25 NOTE — Telephone Encounter (Signed)
 Last OV: 05/19/23 Refills left: 0 Last refill 05/19/23 Patient states he is out of medication

## 2023-05-25 NOTE — Telephone Encounter (Signed)
 Copied from CRM [id number]. Topic: Clinical - Medication Refill >> May 25, 2023  3:15 PM Denese Killings wrote: Most Recent Primary Care Visit:  Provider: Ria Clock [facility]  Department: [facility]  Visit Type: ACUTE  Date: 05/19/2023  Medication: HYDROcodone-acetaminophen (NORCO/VICODIN) 5-325 MG tablet, predniSONE (DELTASONE) 20 MG tablet  Has the patient contacted their pharmacy? No (Agent: If no, request that the patient contact the pharmacy for the refill. If patient does not wish to contact the pharmacy document the reason why and proceed with request.) (Agent: If yes, when and what did the pharmacy advise?)  Is this the correct pharmacy for this prescription? Yes If no, delete pharmacy and type the correct one.  This is the patient's preferred pharmacy:  [facility] [phone] - [place], [place] - [address] AT [facility] [address]  [place] [address] Phone: [phone] Fax: [phone]   Has the prescription been filled recently? Yes  Is the patient out of the medication? Yes Patient stated that he is still in pain, he needs a few more  Has the patient been seen for an appointment in the last year OR does the patient have an upcoming appointment? Yes  Can we respond through MyChart? Yes  Agent: Please be advised that Rx refills may take up to 3 business days. We ask that you follow-up with your pharmacy.

## 2023-05-27 ENCOUNTER — Ambulatory Visit: Payer: Commercial Managed Care - PPO | Admitting: Family

## 2023-05-31 ENCOUNTER — Other Ambulatory Visit (HOSPITAL_COMMUNITY): Payer: Self-pay

## 2023-05-31 ENCOUNTER — Ambulatory Visit (HOSPITAL_BASED_OUTPATIENT_CLINIC_OR_DEPARTMENT_OTHER)
Admission: RE | Admit: 2023-05-31 | Discharge: 2023-05-31 | Disposition: A | Discharge status: Home / Self Care | Payer: Commercial Managed Care - PPO | Source: Ambulatory Visit | Attending: Family | Admitting: Family

## 2023-05-31 ENCOUNTER — Encounter: Payer: Self-pay | Admitting: Family

## 2023-05-31 ENCOUNTER — Telehealth: Payer: Self-pay

## 2023-05-31 ENCOUNTER — Other Ambulatory Visit (HOSPITAL_BASED_OUTPATIENT_CLINIC_OR_DEPARTMENT_OTHER): Payer: Self-pay

## 2023-05-31 ENCOUNTER — Ambulatory Visit: Payer: Commercial Managed Care - PPO | Admitting: Family

## 2023-05-31 VITALS — BP 138/84 | HR 98 | Ht 66.0 in | Wt 184.4 lb

## 2023-05-31 DIAGNOSIS — M7652 Patellar tendinitis, left knee: Secondary | ICD-10-CM | POA: Diagnosis not present

## 2023-05-31 DIAGNOSIS — I10 Essential (primary) hypertension: Secondary | ICD-10-CM | POA: Diagnosis not present

## 2023-05-31 DIAGNOSIS — E119 Type 2 diabetes mellitus without complications: Secondary | ICD-10-CM

## 2023-05-31 DIAGNOSIS — E782 Mixed hyperlipidemia: Secondary | ICD-10-CM | POA: Diagnosis not present

## 2023-05-31 DIAGNOSIS — Z7984 Long term (current) use of oral hypoglycemic drugs: Secondary | ICD-10-CM | POA: Diagnosis not present

## 2023-05-31 DIAGNOSIS — M25562 Pain in left knee: Secondary | ICD-10-CM | POA: Diagnosis not present

## 2023-05-31 DIAGNOSIS — Z7985 Long-term (current) use of injectable non-insulin antidiabetic drugs: Secondary | ICD-10-CM | POA: Diagnosis not present

## 2023-05-31 MED ORDER — CYCLOBENZAPRINE HCL 10 MG PO TABS: 10.0000 mg | ORAL_TABLET | Freq: Three times a day (TID) | ORAL | 1 refills | Status: DC | PRN

## 2023-05-31 MED ORDER — ALLOPURINOL 100 MG PO TABS
100.0000 mg | ORAL_TABLET | Freq: Every day | ORAL | 3 refills | Status: DC
  Filled 2023-05-31: qty 90, 90d supply, fill #0
  Filled 2023-09-07: qty 90, 90d supply, fill #1
  Filled 2023-09-12 – 2023-12-15 (×5): qty 90, 90d supply, fill #0
  Filled 2024-03-15: qty 90, 90d supply, fill #1

## 2023-05-31 MED ORDER — ATORVASTATIN CALCIUM 40 MG PO TABS
40.0000 mg | ORAL_TABLET | Freq: Every day | ORAL | 3 refills | Status: DC
  Filled 2023-05-31: qty 90, 90d supply, fill #0
  Filled 2023-09-07: qty 90, 90d supply, fill #1
  Filled 2023-09-12 – 2023-12-15 (×2): qty 90, 90d supply, fill #0
  Filled 2023-12-15 – 2024-03-15 (×2): qty 90, 90d supply, fill #1

## 2023-05-31 MED ORDER — TIRZEPATIDE 2.5 MG/0.5ML ~~LOC~~ SOAJ
2.5000 mg | SUBCUTANEOUS | 0 refills | Status: DC
  Filled 2023-05-31 – 2023-06-09 (×3): qty 2, 28d supply, fill #0
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
## 2023-05-31 NOTE — Patient Instructions (Signed)
 You will take the Mounjaro 2.5 weekly- please call us when you have one pen of this prescription left so we can increase the dosage as needed. You will continue your Metformin as well.

## 2023-05-31 NOTE — Telephone Encounter (Signed)
 Pharmacy Patient Advocate Encounter   Received notification from Physician's Office that prior authorization for [facility] 2.5MG /0.5ML auto-injectors is required/requested.   Insurance verification completed.   The patient is insured through [facility] .   Per test claim: PA required; PA submitted to above mentioned insurance via CoverMyMeds Key/confirmation #/EOC (Key: [id number])   Status is pending

## 2023-06-01 ENCOUNTER — Other Ambulatory Visit (HOSPITAL_BASED_OUTPATIENT_CLINIC_OR_DEPARTMENT_OTHER): Payer: Self-pay

## 2023-06-01 ENCOUNTER — Other Ambulatory Visit (HOSPITAL_COMMUNITY): Payer: Self-pay

## 2023-06-01 NOTE — Telephone Encounter (Signed)
 Pharmacy Patient Advocate Encounter  Received notification from [facility] that Prior Authorization for Ryan Rhodes has been APPROVED from 2.25.25 to 2.25.26. Ran test claim, Copay is $RTS, RX LAST FILLED TODAY ON 2/26. This test claim was processed through [facility]- copay amounts may vary at other pharmacies due to pharmacy/plan contracts, or as the patient moves through the different stages of their insurance plan.

## 2023-06-03 NOTE — Telephone Encounter (Signed)
 Mychart message has been sent to pt.

## 2023-06-09 ENCOUNTER — Other Ambulatory Visit (HOSPITAL_BASED_OUTPATIENT_CLINIC_OR_DEPARTMENT_OTHER): Payer: Self-pay

## 2023-06-09 ENCOUNTER — Other Ambulatory Visit (HOSPITAL_COMMUNITY): Payer: Self-pay

## 2023-06-18 ENCOUNTER — Other Ambulatory Visit (HOSPITAL_COMMUNITY): Payer: Self-pay

## 2023-06-18 MED FILL — Metformin HCl Tab ER 24HR 500 MG: ORAL | 60 days supply | Qty: 120 | Fill #0 | Status: CN

## 2023-06-20 ENCOUNTER — Other Ambulatory Visit: Payer: Self-pay

## 2023-06-20 ENCOUNTER — Other Ambulatory Visit: Payer: Self-pay | Admitting: Family

## 2023-06-20 DIAGNOSIS — M25562 Pain in left knee: Secondary | ICD-10-CM

## 2023-06-23 ENCOUNTER — Other Ambulatory Visit: Payer: Self-pay

## 2023-07-05 ENCOUNTER — Other Ambulatory Visit: Payer: Self-pay

## 2023-07-05 ENCOUNTER — Other Ambulatory Visit: Payer: Self-pay | Admitting: Family

## 2023-07-05 ENCOUNTER — Other Ambulatory Visit (HOSPITAL_COMMUNITY): Payer: Self-pay

## 2023-07-05 ENCOUNTER — Encounter: Payer: Self-pay | Admitting: Family

## 2023-07-05 MED ORDER — TIRZEPATIDE 5 MG/0.5ML ~~LOC~~ SOAJ
5.0000 mg | SUBCUTANEOUS | 0 refills | Status: DC
  Filled 2023-07-05: qty 2, 28d supply, fill #0

## 2023-07-05 MED FILL — Metformin HCl Tab ER 24HR 500 MG: ORAL | 60 days supply | Qty: 120 | Fill #0 | Status: AC

## 2023-07-06 ENCOUNTER — Encounter: Payer: Self-pay | Admitting: Orthopedic Surgery

## 2023-07-06 ENCOUNTER — Ambulatory Visit: Admitting: Orthopedic Surgery

## 2023-07-06 DIAGNOSIS — M2241 Chondromalacia patellae, right knee: Secondary | ICD-10-CM | POA: Diagnosis not present

## 2023-07-06 DIAGNOSIS — M960 Pseudarthrosis after fusion or arthrodesis: Secondary | ICD-10-CM | POA: Diagnosis not present

## 2023-07-06 DIAGNOSIS — Z6829 Body mass index (BMI) 29.0-29.9, adult: Secondary | ICD-10-CM | POA: Diagnosis not present
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
## 2023-07-08 ENCOUNTER — Other Ambulatory Visit (HOSPITAL_COMMUNITY): Payer: Self-pay

## 2023-07-08 ENCOUNTER — Other Ambulatory Visit: Payer: Self-pay | Admitting: Neurological Surgery

## 2023-07-08 ENCOUNTER — Encounter (HOSPITAL_COMMUNITY): Payer: Self-pay

## 2023-07-08 DIAGNOSIS — M960 Pseudarthrosis after fusion or arthrodesis: Secondary | ICD-10-CM

## 2023-07-09 ENCOUNTER — Encounter: Payer: Self-pay | Admitting: Orthopedic Surgery

## 2023-07-09 MED ORDER — METHYLPREDNISOLONE ACETATE 40 MG/ML IJ SUSP
40.0000 mg | INTRAMUSCULAR | Status: AC | PRN
Start: 2023-07-06 — End: 2023-07-06
  Administered 2023-07-06: 40 mg via INTRA_ARTICULAR

## 2023-07-09 MED ORDER — LIDOCAINE HCL 1 % IJ SOLN
5.0000 mL | INTRAMUSCULAR | Status: AC | PRN
Start: 2023-07-06 — End: 2023-07-06
  Administered 2023-07-06: 5 mL

## 2023-07-09 MED ORDER — BUPIVACAINE HCL 0.25 % IJ SOLN
4.0000 mL | INTRAMUSCULAR | Status: AC | PRN
Start: 2023-07-06 — End: 2023-07-06
  Administered 2023-07-06: 4 mL via INTRA_ARTICULAR

## 2023-08-02 ENCOUNTER — Encounter: Payer: Self-pay | Admitting: Neurological Surgery

## 2023-08-03 ENCOUNTER — Ambulatory Visit: Admitting: Orthopedic Surgery

## 2023-08-05 ENCOUNTER — Ambulatory Visit
Admission: RE | Admit: 2023-08-05 | Discharge: 2023-08-05 | Disposition: A | Discharge status: Home / Self Care | Source: Ambulatory Visit | Attending: Neurological Surgery

## 2023-08-05 DIAGNOSIS — M545 Low back pain, unspecified: Secondary | ICD-10-CM | POA: Diagnosis not present

## 2023-08-05 DIAGNOSIS — Z981 Arthrodesis status: Secondary | ICD-10-CM | POA: Diagnosis not present

## 2023-08-05 DIAGNOSIS — M960 Pseudarthrosis after fusion or arthrodesis: Secondary | ICD-10-CM

## 2023-08-22 DIAGNOSIS — M960 Pseudarthrosis after fusion or arthrodesis: Secondary | ICD-10-CM | POA: Diagnosis not present

## 2023-08-30 ENCOUNTER — Ambulatory Visit: Payer: Commercial Managed Care - PPO | Admitting: Family

## 2023-09-06 ENCOUNTER — Ambulatory Visit: Admitting: Family

## 2023-09-07 ENCOUNTER — Other Ambulatory Visit: Payer: Self-pay | Admitting: Family

## 2023-09-07 ENCOUNTER — Other Ambulatory Visit: Payer: Self-pay

## 2023-09-07 ENCOUNTER — Other Ambulatory Visit (HOSPITAL_BASED_OUTPATIENT_CLINIC_OR_DEPARTMENT_OTHER): Payer: Self-pay

## 2023-09-07 DIAGNOSIS — M960 Pseudarthrosis after fusion or arthrodesis: Secondary | ICD-10-CM | POA: Diagnosis not present

## 2023-09-07 DIAGNOSIS — E119 Type 2 diabetes mellitus without complications: Secondary | ICD-10-CM

## 2023-09-07 MED ORDER — METFORMIN HCL ER 500 MG PO TB24
500.0000 mg | ORAL_TABLET | Freq: Two times a day (BID) | ORAL | 0 refills | Status: DC
Start: 2023-09-07 — End: 2023-12-15
  Filled 2023-09-07 – 2023-09-12 (×2): qty 180, 90d supply, fill #0

## 2023-09-08 ENCOUNTER — Other Ambulatory Visit: Payer: Self-pay

## 2023-09-12 ENCOUNTER — Other Ambulatory Visit (HOSPITAL_COMMUNITY): Payer: Self-pay

## 2023-09-12 ENCOUNTER — Other Ambulatory Visit: Payer: Self-pay | Admitting: Family

## 2023-09-12 ENCOUNTER — Other Ambulatory Visit (HOSPITAL_BASED_OUTPATIENT_CLINIC_OR_DEPARTMENT_OTHER): Payer: Self-pay

## 2023-09-12 MED ORDER — TIRZEPATIDE 5 MG/0.5ML ~~LOC~~ SOAJ
5.0000 mg | SUBCUTANEOUS | 0 refills | Status: DC
  Filled 2023-09-12: qty 2, 28d supply, fill #0

## 2023-09-12 NOTE — Telephone Encounter (Signed)
 Copied from CRM [id number]. Topic: Clinical - Medication Refill >> Sep 12, 2023  4:04 PM Adonis Hoot wrote: Medication: tirzepatide  (MOUNJARO ) 5 MG/0.5ML Pen  Has the patient contacted their pharmacy? Yes (Agent: If no, request that the patient contact the pharmacy for the refill. If patient does not wish to contact the pharmacy document the reason why and proceed with request.) (Agent: If yes, when and what did the pharmacy advise?)  This is the patient's preferred pharmacy:     [facility] [address], [address] [place] [address] Phone: [phone] Fax: [phone]  Is this the correct pharmacy for this prescription? Yes If no, delete pharmacy and type the correct one.   Has the prescription been filled recently? Yes  Is the patient out of the medication? Yes  Has the patient been seen for an appointment in the last year OR does the patient have an upcoming appointment? Yes  Can we respond through MyChart? Yes  Agent: Please be advised that Rx refills may take up to 3 business days. We ask that you follow-up with your pharmacy.

## 2023-09-13 ENCOUNTER — Other Ambulatory Visit: Payer: Self-pay

## 2023-09-16 ENCOUNTER — Ambulatory Visit: Admitting: Family

## 2023-09-23 ENCOUNTER — Other Ambulatory Visit (HOSPITAL_COMMUNITY): Payer: Self-pay

## 2023-10-15 ENCOUNTER — Other Ambulatory Visit (HOSPITAL_COMMUNITY): Payer: Self-pay

## 2023-10-24 ENCOUNTER — Other Ambulatory Visit (HOSPITAL_COMMUNITY): Payer: Self-pay

## 2023-10-24 MED ORDER — IBUPROFEN 800 MG PO TABS
800.0000 mg | ORAL_TABLET | Freq: Three times a day (TID) | ORAL | 1 refills | Status: DC | PRN
  Filled 2023-10-24 – 2023-12-15 (×2): qty 30, 10d supply, fill #0
  Filled 2023-12-15: qty 30, 10d supply, fill #1

## 2023-10-24 MED ORDER — HYDROCODONE-ACETAMINOPHEN 5-325 MG PO TABS
1.0000 | ORAL_TABLET | Freq: Four times a day (QID) | ORAL | 0 refills | Status: DC | PRN
  Filled 2023-10-24: qty 16, 4d supply, fill #0

## 2023-10-25 ENCOUNTER — Other Ambulatory Visit (HOSPITAL_COMMUNITY): Payer: Self-pay

## 2023-11-17 ENCOUNTER — Other Ambulatory Visit (HOSPITAL_COMMUNITY): Payer: Self-pay

## 2023-11-22 ENCOUNTER — Other Ambulatory Visit (HOSPITAL_COMMUNITY): Payer: Self-pay

## 2023-11-22 ENCOUNTER — Emergency Department (HOSPITAL_COMMUNITY)
Admission: EM | Admit: 2023-11-22 | Discharge: 2023-11-22 | Disposition: A | Discharge status: Home / Self Care | Source: Ambulatory Visit | Attending: Emergency Medicine | Admitting: Emergency Medicine

## 2023-11-22 ENCOUNTER — Other Ambulatory Visit: Payer: Self-pay

## 2023-11-22 ENCOUNTER — Ambulatory Visit: Payer: Self-pay

## 2023-11-22 ENCOUNTER — Encounter (HOSPITAL_COMMUNITY): Payer: Self-pay | Admitting: Emergency Medicine

## 2023-11-22 DIAGNOSIS — I10 Essential (primary) hypertension: Secondary | ICD-10-CM | POA: Diagnosis not present

## 2023-11-22 DIAGNOSIS — Z79899 Other long term (current) drug therapy: Secondary | ICD-10-CM | POA: Diagnosis not present

## 2023-11-22 MED ORDER — AMLODIPINE BESYLATE 5 MG PO TABS
5.0000 mg | ORAL_TABLET | Freq: Every day | ORAL | 0 refills | Status: DC
  Filled 2023-11-22 – 2023-11-24 (×2): qty 30, 30d supply, fill #0

## 2023-11-22 NOTE — Telephone Encounter (Signed)
 FYI Only or Action Required?: Action required by provider: update on patient condition.  Patient was last seen in primary care on 05/31/2023 by Jason Leita Repine, FNP.  Called Nurse Triage reporting Hypertension.  Symptoms began about a month ago.  Interventions attempted: Prescription medications: states that he picked up BP meds on 11/17/23, but they have not been helping.  Symptoms are: unchanged.  Triage Disposition: Go to ED Now (Notify PCP)  Patient/caregiver understands and will follow disposition?: Yes  Copied from CRM [id number]. Topic: Clinical - Red Word Triage >> Nov 22, 2023  2:49 PM Melissa C wrote: Red Word that prompted transfer to Nurse Triage: patient stated he has had elevated blood pressures for the past few weeks. I asked for specific numbers and he said the top number has been elevated (couldn't give specific number) but the bottom number has been way over 100. He stated he was off a medication a few weeks ago and thought it would have evened out by now. Patient stated he had episodes of nausea before he started back on the medication. Had to reschedule his dentist appointment twice due to high blood pressures. Reason for Disposition  [1] Systolic BP >= 160 OR Diastolic >= 100 AND [2] cardiac (e.g., breathing difficulty, chest pain) or neurologic symptoms (e.g., new-onset blurred or double vision, unsteady gait)  Answer Assessment - Initial Assessment Questions 1. BLOOD PRESSURE: What is your blood pressure? Did you take at least two measurements 5 minutes apart?     Elevated in dentist office, too high and procedures were cancelled. Diastolic reading today over 100 in dentist office 30 minutes prior to call  2. ONSET: When did you take your blood pressure?     A couple of weeks ago 3. HOW: How did you take your blood pressure? (e.g., automatic home BP monitor, visiting nurse)     N/A 4. HISTORY: Do you have a history of high blood pressure?     yes 5.  MEDICINES: Are you taking any medicines for blood pressure? Have you missed any doses recently?     Had been off of medication for about two weeks, restarted on 11/17/23 (4 days ago) 6. OTHER SYMPTOMS: Do you have any symptoms? (e.g., blurred vision, chest pain, difficulty breathing, headache, weakness)     Reports occasional slight head ache and slight blurred vision last week when he first gets up in the morning, states that this is not unusual 7. PREGNANCY: Is there any chance you are pregnant? When was your last menstrual period?     N/a  Protocols used: Blood Pressure - High-A-AH

## 2023-11-22 NOTE — ED Provider Notes (Signed)
 [place] EMERGENCY DEPARTMENT AT [facility] Provider Note   CSN: [id number] Arrival date & time: 11/22/23  1646     Patient presents with: Hypertension   Ryan Rhodes is a 63 y.o. male presented to ED with concern for high blood pressure.  Patient reports that he been off of his enalapril  medication 20 mg twice daily for a few weeks.  He restarted about a week ago.  He is set to visit the dentist office since then they are concerned his blood pressure remains high.  He has been taking his medication regularly.  Reports he does eat takeout and salty food perhaps 2 or 3 times a week.  He does not smoke.  There is a family history of high blood pressure.  He denies any headache, persistent blurred vision, chest pain, loss of consciousness.   HPI     Prior to Admission medications   Medication Sig Start Date End Date Taking? Authorizing Provider  amLODipine  (NORVASC ) 5 MG tablet Take 1 tablet (5 mg total) by mouth daily. 11/22/23 12/22/23 Yes Mayukha Symmonds, Donnice JINNY, MD  acetaminophen  (TYLENOL ) 500 MG tablet Take 1,000 mg by mouth every 6 (six) hours as needed for moderate pain (pain score 4-6).    [provider]  allopurinol  (ZYLOPRIM ) 100 MG tablet Take 1 tablet (100 mg total) by mouth daily. 05/31/23   Jason Leita Repine, FNP  atorvastatin  (LIPITOR) 40 MG tablet Take 1 tablet (40 mg total) by mouth daily at 6 PM. 05/31/23   Jason Leita Repine, FNP  cyclobenzaprine  (FLEXERIL ) 10 MG tablet Take 1 tablet (10 mg total) by mouth 3 (three) times daily as needed for muscle spasms. 10/12/22   Cosentino, Allison R, PA-C  cyclobenzaprine  (FLEXERIL ) 10 MG tablet Take 1 tablet (10 mg total) by mouth 3 (three) times daily as needed for muscle spasms. 11/10/22     enalapril  (VASOTEC ) 20 MG tablet Take 1 tablet (20 mg total) by mouth 2 (two) times daily. 05/19/23   Jason Leita Repine, FNP  HYDROcodone -acetaminophen  (NORCO/VICODIN) 5-325 MG tablet Take 1 tablet by mouth every 6 (six)  hours as needed for pain. 10/24/23     ibuprofen  (ADVIL ) 800 MG tablet Take 1 tablet (800 mg total) by mouth every 8 (eight) hours with food as needed. 10/24/23     indomethacin  (INDOCIN ) 50 MG capsule TAKE 1 CAPSULE BY MOUTH TWICE DAILY AS NEEDED FOR GOUT FLARE 05/24/23   Jason Leita Repine, FNP  metFORMIN  (GLUCOPHAGE -XR) 500 MG 24 hr tablet Take 1 tablet (500 mg total) by mouth in the morning and at bedtime. 09/07/23   Jason Leita Repine, FNP  Multiple Vitamins-Minerals (MULTIVITAMIN WITH MINERALS) tablet Take 1 tablet by mouth daily.    [provider]  tirzepatide  (MOUNJARO ) 5 MG/0.5ML Pen Inject 5 mg into the skin once a week. 09/12/23   Jason Leita Repine, FNP    Allergies: Patient has no known allergies.    Review of Systems  Updated Vital Signs BP (!) 161/109   Pulse 98   Temp 98.4 F (36.9 C) (Oral)   Resp 18   Ht 5' 6 (1.676 m)   Wt 83.9 kg   SpO2 100%   BMI 29.86 kg/m   Physical Exam Constitutional:      General: He is not in acute distress. HENT:     Head: Normocephalic and atraumatic.  Eyes:     Conjunctiva/sclera: Conjunctivae normal.     Pupils: Pupils are equal, round, and reactive to light.  Cardiovascular:     Rate and Rhythm: Normal rate and regular rhythm.  Pulmonary:     Effort: Pulmonary effort is normal. No respiratory distress.  Abdominal:     General: There is no distension.     Tenderness: There is no abdominal tenderness.  Skin:    General: Skin is warm and dry.  Neurological:     General: No focal deficit present.     Mental Status: He is alert. Mental status is at baseline.  Psychiatric:        Mood and Affect: Mood normal.        Behavior: Behavior normal.     (all labs ordered are listed, but only abnormal results are displayed) Labs Reviewed - No data to display  EKG: None  Radiology: No results found.   Procedures   Medications Ordered in the ED - No data to display                                   Medical Decision Making  Patient is presenting currently with asymptomatic hypertension; per ACEP clinical guidelines, no further workup is indicated at this time on an emergent basis.  Doubt stroke, ACS, PE, or other life-threatening emergency, including renal failure with this presentation.  We discussed multifactorial nature of high blood pressure, and advised a lower salt diet, regular exercise, and for now we can start him on amlodipine  5 mg daily.  He has PCP follow-up tomorrow.  He is comfortable with this plan.     Final diagnoses:  Hypertension, unspecified type    ED Discharge Orders          Ordered    amLODipine  (NORVASC ) 5 MG tablet  Daily        11/22/23 1746               Cottie Donnice PARAS, MD 11/22/23 1746

## 2023-11-22 NOTE — ED Triage Notes (Signed)
 Patient c/o hypertension. Patient report unable to take his medication for 2-3 weeks d/t pharmacy concern but able to restarted last Friday. Patient report BP still elevated on his Dental procedure today Patient denies headache and blurred vision. Patient denies N/V.

## 2023-11-23 ENCOUNTER — Encounter (HOSPITAL_COMMUNITY): Payer: Self-pay

## 2023-11-23 ENCOUNTER — Other Ambulatory Visit (HOSPITAL_COMMUNITY): Payer: Self-pay

## 2023-11-23 ENCOUNTER — Other Ambulatory Visit: Payer: Self-pay

## 2023-11-23 ENCOUNTER — Ambulatory Visit: Admitting: Physician Assistant

## 2023-11-24 ENCOUNTER — Other Ambulatory Visit (HOSPITAL_COMMUNITY): Payer: Self-pay

## 2023-11-29 ENCOUNTER — Ambulatory Visit (INDEPENDENT_AMBULATORY_CARE_PROVIDER_SITE_OTHER): Admitting: Family

## 2023-11-29 ENCOUNTER — Encounter: Payer: Self-pay | Admitting: Family

## 2023-11-29 ENCOUNTER — Other Ambulatory Visit (HOSPITAL_COMMUNITY): Payer: Self-pay

## 2023-11-29 VITALS — BP 118/72 | HR 89 | Ht 66.0 in | Wt 181.0 lb

## 2023-11-29 DIAGNOSIS — Z7985 Long-term (current) use of injectable non-insulin antidiabetic drugs: Secondary | ICD-10-CM | POA: Diagnosis not present

## 2023-11-29 DIAGNOSIS — R109 Unspecified abdominal pain: Secondary | ICD-10-CM

## 2023-11-29 DIAGNOSIS — I10 Essential (primary) hypertension: Secondary | ICD-10-CM | POA: Diagnosis not present

## 2023-11-29 DIAGNOSIS — E119 Type 2 diabetes mellitus without complications: Secondary | ICD-10-CM | POA: Diagnosis not present

## 2023-11-29 MED ORDER — AMLODIPINE BESYLATE 5 MG PO TABS
5.0000 mg | ORAL_TABLET | Freq: Every day | ORAL | 0 refills | Status: DC
  Filled 2023-11-29 – 2023-12-29 (×4): qty 90, 90d supply, fill #0
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
## 2023-11-30 ENCOUNTER — Ambulatory Visit: Payer: Self-pay | Admitting: Family

## 2023-11-30 LAB — CBC WITH DIFFERENTIAL/PLATELET
Basophils Absolute: 0 K/uL (ref 0.0–0.1)
Basophils Relative: 1 % (ref 0.0–3.0)
Eosinophils Absolute: 0 K/uL (ref 0.0–0.7)
Eosinophils Relative: 0.9 % (ref 0.0–5.0)
HCT: 46.2 % (ref 39.0–52.0)
Hemoglobin: 15.2 g/dL (ref 13.0–17.0)
Lymphocytes Relative: 31.9 % (ref 12.0–46.0)
Lymphs Abs: 1.6 K/uL (ref 0.7–4.0)
MCHC: 32.8 g/dL (ref 30.0–36.0)
MCV: 82.1 fl (ref 78.0–100.0)
Monocytes Absolute: 0.9 K/uL (ref 0.1–1.0)
Monocytes Relative: 17.9 % — ABNORMAL HIGH (ref 3.0–12.0)
Neutro Abs: 2.4 K/uL (ref 1.4–7.7)
Neutrophils Relative %: 48.3 % (ref 43.0–77.0)
Platelets: 329 K/uL (ref 150.0–400.0)
RBC: 5.63 Mil/uL (ref 4.22–5.81)
RDW: 14.2 % (ref 11.5–15.5)
WBC: 4.9 K/uL (ref 4.0–10.5)

## 2023-11-30 LAB — COMPREHENSIVE METABOLIC PANEL WITH GFR
ALT: 33 U/L (ref 0–53)
AST: 34 U/L (ref 0–37)
Albumin: 4.7 g/dL (ref 3.5–5.2)
Alkaline Phosphatase: 65 U/L (ref 39–117)
BUN: 8 mg/dL (ref 6–23)
CO2: 27 meq/L (ref 19–32)
Calcium: 10.2 mg/dL (ref 8.4–10.5)
Chloride: 97 meq/L (ref 96–112)
Creatinine, Ser: 0.85 mg/dL (ref 0.40–1.50)
GFR: 94.16 mL/min (ref >60.00)
Glucose, Bld: 132 mg/dL — ABNORMAL HIGH (ref 70–99)
Potassium: 4.4 meq/L (ref 3.5–5.1)
Sodium: 136 meq/L (ref 135–145)
Total Bilirubin: 0.5 mg/dL (ref 0.2–1.2)
Total Protein: 8.1 g/dL (ref 6.0–8.3)

## 2023-11-30 LAB — HEMOGLOBIN A1C: Hgb A1c MFr Bld: 7.2 % — ABNORMAL HIGH (ref 4.6–6.5)

## 2023-12-07 ENCOUNTER — Ambulatory Visit (HOSPITAL_BASED_OUTPATIENT_CLINIC_OR_DEPARTMENT_OTHER): Admission: RE | Admit: 2023-12-07 | Source: Ambulatory Visit

## 2023-12-15 ENCOUNTER — Other Ambulatory Visit: Payer: Self-pay | Admitting: Family

## 2023-12-15 ENCOUNTER — Other Ambulatory Visit (HOSPITAL_COMMUNITY): Payer: Self-pay

## 2023-12-15 ENCOUNTER — Ambulatory Visit (HOSPITAL_BASED_OUTPATIENT_CLINIC_OR_DEPARTMENT_OTHER)
Admission: RE | Admit: 2023-12-15 | Discharge: 2023-12-15 | Disposition: A | Discharge status: Home / Self Care | Source: Ambulatory Visit | Attending: Family | Admitting: Family

## 2023-12-15 DIAGNOSIS — M109 Gout, unspecified: Secondary | ICD-10-CM

## 2023-12-15 DIAGNOSIS — N3289 Other specified disorders of bladder: Secondary | ICD-10-CM | POA: Diagnosis not present

## 2023-12-15 DIAGNOSIS — R109 Unspecified abdominal pain: Secondary | ICD-10-CM | POA: Diagnosis not present

## 2023-12-15 DIAGNOSIS — E119 Type 2 diabetes mellitus without complications: Secondary | ICD-10-CM

## 2023-12-15 DIAGNOSIS — K5730 Diverticulosis of large intestine without perforation or abscess without bleeding: Secondary | ICD-10-CM | POA: Diagnosis not present

## 2023-12-15 MED ORDER — IOHEXOL 300 MG/ML  SOLN
80.0000 mL | Freq: Once | INTRAMUSCULAR | Status: AC | PRN
  Administered 2023-12-15: 80 mL via INTRAVENOUS

## 2023-12-15 MED ORDER — METFORMIN HCL ER 500 MG PO TB24
500.0000 mg | ORAL_TABLET | Freq: Two times a day (BID) | ORAL | 0 refills | Status: DC
  Filled 2023-12-15 – 2023-12-23 (×2): qty 180, 90d supply, fill #0

## 2023-12-15 MED ORDER — INDOMETHACIN 50 MG PO CAPS
50.0000 mg | ORAL_CAPSULE | Freq: Two times a day (BID) | ORAL | 0 refills | Status: DC | PRN
  Filled 2023-12-15 – 2023-12-23 (×2): qty 30, 15d supply, fill #0

## 2023-12-19 ENCOUNTER — Other Ambulatory Visit: Payer: Self-pay | Admitting: Family

## 2023-12-19 DIAGNOSIS — R911 Solitary pulmonary nodule: Secondary | ICD-10-CM

## 2023-12-23 ENCOUNTER — Other Ambulatory Visit (HOSPITAL_COMMUNITY): Payer: Self-pay

## 2023-12-29 ENCOUNTER — Other Ambulatory Visit (HOSPITAL_COMMUNITY): Payer: Self-pay

## 2023-12-30 ENCOUNTER — Encounter: Payer: Self-pay | Admitting: Family

## 2024-01-04 DIAGNOSIS — M5416 Radiculopathy, lumbar region: Secondary | ICD-10-CM | POA: Diagnosis not present

## 2024-01-04 DIAGNOSIS — M960 Pseudarthrosis after fusion or arthrodesis: Secondary | ICD-10-CM | POA: Diagnosis not present

## 2024-01-04 DIAGNOSIS — Z6829 Body mass index (BMI) 29.0-29.9, adult: Secondary | ICD-10-CM | POA: Diagnosis not present

## 2024-01-06 ENCOUNTER — Other Ambulatory Visit

## 2024-01-09 ENCOUNTER — Ambulatory Visit
Admission: RE | Admit: 2024-01-09 | Discharge: 2024-01-09 | Disposition: A | Discharge status: Home / Self Care | Source: Ambulatory Visit | Attending: Family | Admitting: Family

## 2024-01-09 DIAGNOSIS — R911 Solitary pulmonary nodule: Secondary | ICD-10-CM

## 2024-01-09 DIAGNOSIS — J984 Other disorders of lung: Secondary | ICD-10-CM | POA: Diagnosis not present

## 2024-01-12 ENCOUNTER — Other Ambulatory Visit: Payer: Self-pay

## 2024-01-12 ENCOUNTER — Ambulatory Visit: Payer: Self-pay | Admitting: Family

## 2024-01-12 DIAGNOSIS — R911 Solitary pulmonary nodule: Secondary | ICD-10-CM

## 2024-01-23 ENCOUNTER — Other Ambulatory Visit: Payer: Self-pay | Admitting: Family

## 2024-01-23 ENCOUNTER — Other Ambulatory Visit (HOSPITAL_COMMUNITY): Payer: Self-pay

## 2024-01-23 DIAGNOSIS — M109 Gout, unspecified: Secondary | ICD-10-CM

## 2024-01-23 MED ORDER — INDOMETHACIN 50 MG PO CAPS
50.0000 mg | ORAL_CAPSULE | Freq: Two times a day (BID) | ORAL | 0 refills | Status: DC | PRN
Start: 2024-01-23 — End: 2024-02-10
  Filled 2024-01-23: qty 30, 15d supply, fill #0

## 2024-01-23 NOTE — Telephone Encounter (Signed)
 Copied from CRM #[id number]. Topic: Clinical - Medication Prior Auth >> Jan 23, 2024  1:05 PM Aleatha C wrote: Reason for CRM: Patient has been waiting for his indomethacin  (INDOCIN ) 50 MG capsule approval for over 2 weeks and need it as soon as possible

## 2024-01-25 ENCOUNTER — Encounter: Payer: Self-pay | Admitting: *Deleted

## 2024-02-06 ENCOUNTER — Encounter: Payer: Self-pay | Admitting: Radiology

## 2024-02-07 NOTE — Telephone Encounter (Signed)
 Copied from CRM [id number]. Topic: Clinical - Lab/Test Results >> Feb 07, 2024 12:15 PM Viola F wrote: Reason for CRM: Patient returning call regarding CT results, he received letter in the mail but would like a call back at [phone] (M)  TOC appt scheduled with Waddell Mon

## 2024-02-07 NOTE — Telephone Encounter (Signed)
 Spoke w/ Pt- made him aware of results, Pt agreed to see pulmonary. Urgent referral placed.

## 2024-02-10 ENCOUNTER — Other Ambulatory Visit: Payer: Self-pay | Admitting: Family

## 2024-02-10 ENCOUNTER — Other Ambulatory Visit: Payer: Self-pay

## 2024-02-10 ENCOUNTER — Other Ambulatory Visit (HOSPITAL_COMMUNITY): Payer: Self-pay

## 2024-02-10 DIAGNOSIS — M109 Gout, unspecified: Secondary | ICD-10-CM

## 2024-02-11 ENCOUNTER — Other Ambulatory Visit (HOSPITAL_COMMUNITY): Payer: Self-pay

## 2024-02-13 ENCOUNTER — Other Ambulatory Visit: Payer: Self-pay

## 2024-02-13 ENCOUNTER — Other Ambulatory Visit (HOSPITAL_COMMUNITY): Payer: Self-pay

## 2024-02-14 ENCOUNTER — Other Ambulatory Visit: Payer: Self-pay | Admitting: Family

## 2024-02-14 ENCOUNTER — Other Ambulatory Visit (HOSPITAL_COMMUNITY): Payer: Self-pay

## 2024-02-14 DIAGNOSIS — M109 Gout, unspecified: Secondary | ICD-10-CM

## 2024-02-14 MED ORDER — MOUNJARO 5 MG/0.5ML ~~LOC~~ SOAJ
5.0000 mg | SUBCUTANEOUS | 0 refills | Status: DC
  Filled 2024-02-14: qty 2, 28d supply, fill #0

## 2024-02-14 MED ORDER — INDOMETHACIN 50 MG PO CAPS
50.0000 mg | ORAL_CAPSULE | Freq: Two times a day (BID) | ORAL | 0 refills | Status: DC | PRN
  Filled 2024-02-14: qty 30, 15d supply, fill #0

## 2024-02-14 NOTE — Telephone Encounter (Signed)
 Copied from CRM [id number]. Topic: General - Other >> Feb 14, 2024  2:16 PM Paige D wrote: Reason for CRM: pharmacist at [facility]  calling in regards to pt medication  indomethacin  (INDOCIN ) 50 MG capsules. Pt is out and stating he needs his medication. Script pending provider  webb signature. Please reach out to pt when script is sent over.

## 2024-02-15 ENCOUNTER — Other Ambulatory Visit (HOSPITAL_COMMUNITY): Payer: Self-pay

## 2024-02-15 ENCOUNTER — Ambulatory Visit: Admitting: Pulmonary Disease

## 2024-02-16 ENCOUNTER — Encounter: Payer: Self-pay | Admitting: Pulmonary Disease

## 2024-02-23 ENCOUNTER — Other Ambulatory Visit: Payer: Self-pay | Admitting: Family

## 2024-02-23 DIAGNOSIS — M109 Gout, unspecified: Secondary | ICD-10-CM

## 2024-03-07 ENCOUNTER — Other Ambulatory Visit: Payer: Self-pay | Admitting: Family

## 2024-03-09 ENCOUNTER — Other Ambulatory Visit (HOSPITAL_COMMUNITY): Payer: Self-pay

## 2024-03-09 MED ORDER — MOUNJARO 5 MG/0.5ML ~~LOC~~ SOAJ
5.0000 mg | SUBCUTANEOUS | 1 refills | Status: AC
  Filled 2024-03-09: qty 2, 28d supply, fill #0
  Filled 2024-04-08: qty 2, 28d supply, fill #1

## 2024-03-09 MED ORDER — INDOMETHACIN 50 MG PO CAPS
50.0000 mg | ORAL_CAPSULE | Freq: Two times a day (BID) | ORAL | 0 refills | Status: DC | PRN
  Filled 2024-04-05: qty 30, 15d supply, fill #0

## 2024-03-15 ENCOUNTER — Other Ambulatory Visit: Payer: Self-pay

## 2024-03-15 ENCOUNTER — Ambulatory Visit: Admitting: Emergency Medicine

## 2024-03-21 ENCOUNTER — Other Ambulatory Visit: Payer: Self-pay

## 2024-03-22 ENCOUNTER — Ambulatory Visit

## 2024-03-22 VITALS — BP 176/120 | HR 102 | Temp 97.7°F | Ht 66.0 in | Wt 188.0 lb

## 2024-03-22 DIAGNOSIS — R911 Solitary pulmonary nodule: Secondary | ICD-10-CM

## 2024-03-22 DIAGNOSIS — G8929 Other chronic pain: Secondary | ICD-10-CM | POA: Diagnosis not present

## 2024-03-22 DIAGNOSIS — Z801 Family history of malignant neoplasm of trachea, bronchus and lung: Secondary | ICD-10-CM | POA: Diagnosis not present

## 2024-03-22 DIAGNOSIS — M545 Low back pain, unspecified: Secondary | ICD-10-CM

## 2024-03-22 NOTE — Patient Instructions (Addendum)
°  VISIT SUMMARY: You visited us  today to discuss a lung nodule found on your recent imaging. The nodule was first identified in September and confirmed in October. You have no respiratory symptoms and have never smoked, but there is a family history of cancer.  YOUR PLAN: SOLITARY PULMONARY NODULE, RIGHT LUNG: A nodule was found in your right lung during a CT scan.Given your family history of cancer, we need to monitor this closely. -We have ordered a chest CT with contrast to get a better look at the nodule and its proximity to the blood vessel. -If the nodule persists or increases in size, we may need to perform a bronchoscopic biopsy to obtain a tissue sample. We went over bronchoscopic biopsy procedure today -We will monitor the nodule for any changes in size or persistence on follow-up imaging and discuss the results with you.    Contains text generated by Abridge.
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
## 2024-04-02 ENCOUNTER — Other Ambulatory Visit: Payer: Self-pay | Admitting: Neurosurgery

## 2024-04-02 DIAGNOSIS — M960 Pseudarthrosis after fusion or arthrodesis: Secondary | ICD-10-CM

## 2024-04-05 ENCOUNTER — Other Ambulatory Visit (HOSPITAL_COMMUNITY): Payer: Self-pay

## 2024-04-06 ENCOUNTER — Other Ambulatory Visit (HOSPITAL_COMMUNITY): Payer: Self-pay

## 2024-04-06 ENCOUNTER — Ambulatory Visit
Admission: RE | Admit: 2024-04-06 | Discharge: 2024-04-06 | Disposition: A | Discharge status: Home / Self Care | Source: Ambulatory Visit

## 2024-04-06 ENCOUNTER — Ambulatory Visit: Payer: Self-pay

## 2024-04-06 DIAGNOSIS — R911 Solitary pulmonary nodule: Secondary | ICD-10-CM

## 2024-04-06 MED ORDER — IOPAMIDOL (ISOVUE-300) INJECTION 61%
80.0000 mL | Freq: Once | INTRAVENOUS | Status: AC | PRN
  Administered 2024-04-06: 80 mL via INTRAVENOUS

## 2024-04-06 NOTE — Telephone Encounter (Signed)
 FYI Only or Action Required?: FYI only for provider: appointment scheduled on 04/09/24.  Patient was last seen in primary care on 11/29/2023 by Jason Leita Repine, FNP.  Called Nurse Triage reporting Arm Swelling.  Symptoms began about a month ago.  Interventions attempted: OTC medications: Ibuprofen .  Symptoms are: gradually worsening.  Triage Disposition: See Physician Within 24 Hours  Patient/caregiver understands and will follow disposition?: Yes         Copied from CRM #[id number]. Topic: Clinical - Red Word Triage >> Apr 06, 2024  1:49 PM Alexandria E wrote: [facility] that prompted transfer to Nurse Triage: Right hand swelling, patient cannot use hand. Going on for the past couple of weeks. Reason for Disposition  MODERATE hand swelling (e.g., visible swelling of hand and fingers; pitting edema)  Answer Assessment - Initial Assessment Questions 1. ONSET: When did the swelling start? (e.g., minutes, hours, days)     X 1 month  2. LOCATION: What part of the hand is swollen?  Are both hands swollen or just one hand?     Whole hand   3. TYPE : What does it look like? (e.g., ball, lump; localized; hand swelling)     hand swelling  4. SWELLING SEVERITY: If more than a lump or localized, ask: How bad is the hand swelling? (e.g., mild, moderate, severe; describe)     Moderate  5. REDNESS: Is there redness or signs of infection?     No   6. PAIN: Is the swelling painful to touch? If Yes, ask: How painful is it?   (Scale 1-10; mild, moderate or severe)     Yes,   7. FEVER: Do you have a fever? If Yes, ask: What is it, how was it measured, and when did it start?      No   8. CAUSE: What do you think is causing the hand swelling? (e.g., heat, insect bite, pregnancy, recent injury)     Unsure, possible Gout    9. MEDICAL HISTORY: Do you have a history of heart failure, kidney disease, liver failure, or cancer?     No   10. RECURRENT SYMPTOM: Have  you had hand swelling before? If Yes, ask: When was the last time? What happened that time?       Yes, wife is unsure   1. OTHER SYMPTOMS: Do you have any other symptoms? (e.g., blurred vision, difficulty breathing, headache)       No     Patient's wife  called in to triage with complaints of right hand swelling. This has been ongoing for one month. The wife stated it is hard for him to grasp items. For home care, the patient is taking OTC Ibuprofen    Appointment scheduled for further evaluation; wife agrees with the plan of care, and will reach out if symptoms worsen or persist.  Protocols used: Hand Swelling-A-AH

## 2024-04-08 ENCOUNTER — Other Ambulatory Visit (HOSPITAL_COMMUNITY): Payer: Self-pay

## 2024-04-09 ENCOUNTER — Other Ambulatory Visit: Payer: Self-pay | Admitting: Family

## 2024-04-09 ENCOUNTER — Ambulatory Visit: Admitting: Family Medicine

## 2024-04-09 DIAGNOSIS — E782 Mixed hyperlipidemia: Secondary | ICD-10-CM

## 2024-04-09 DIAGNOSIS — M109 Gout, unspecified: Secondary | ICD-10-CM

## 2024-04-09 DIAGNOSIS — I10 Essential (primary) hypertension: Secondary | ICD-10-CM

## 2024-04-09 NOTE — Telephone Encounter (Unsigned)
 Copied from CRM #[id number]. Topic: Clinical - Medication Refill >> Apr 09, 2024 11:08 AM Zy'onna H wrote: Medication:  1. metFORMIN  (GLUCOPHAGE -XR) 500 MG 24 hr tablet 2. tirzepatide  (MOUNJARO ) 5 MG/0.5ML Pen 3. atorvastatin  (LIPITOR) 40 MG tablet 4. allopurinol  (ZYLOPRIM ) 100 MG tablet 5. enalapril  (VASOTEC ) 20 MG tablet 6. indomethacin  (INDOCIN ) 50 MG capsule Has the patient contacted their pharmacy? Yes (Agent: If no, request that the patient contact the pharmacy for the refill. If patient does not wish to contact the pharmacy document the reason why and proceed with request.) (Agent: If yes, when and what did the pharmacy advise?)  This is the patient's preferred pharmacy:  [place] [facility] [address] [place] [address] Phone: [phone] Fax: [phone]  Is this the correct pharmacy for this prescription? Yes If no, delete pharmacy and type the correct one.   Has the prescription been filled recently? Yes  Is the patient out of the medication? Yes  Has the patient been seen for an appointment in the last year OR does the patient have an upcoming appointment? Yes  Can we respond through MyChart? Yes  Agent: Please be advised that Rx refills may take up to 3 business days. We ask that you follow-up with your pharmacy.

## 2024-04-10 ENCOUNTER — Other Ambulatory Visit (HOSPITAL_COMMUNITY): Payer: Self-pay

## 2024-04-10 MED ORDER — ALLOPURINOL 100 MG PO TABS
100.0000 mg | ORAL_TABLET | Freq: Every day | ORAL | 3 refills | Status: AC
  Filled 2024-04-10: qty 90, 90d supply, fill #0

## 2024-04-10 MED ORDER — MOUNJARO 5 MG/0.5ML ~~LOC~~ SOAJ
5.0000 mg | SUBCUTANEOUS | 1 refills | Status: AC
  Filled 2024-05-08: qty 2, 28d supply, fill #0

## 2024-04-10 MED ORDER — INDOMETHACIN 50 MG PO CAPS: 50.0000 mg | ORAL_CAPSULE | Freq: Two times a day (BID) | ORAL | 0 refills | Status: AC | PRN

## 2024-04-10 MED ORDER — ATORVASTATIN CALCIUM 40 MG PO TABS
40.0000 mg | ORAL_TABLET | Freq: Every day | ORAL | 3 refills | Status: AC
  Filled 2024-04-10: qty 90, 90d supply, fill #0

## 2024-04-10 MED ORDER — ENALAPRIL MALEATE 20 MG PO TABS
20.0000 mg | ORAL_TABLET | Freq: Two times a day (BID) | ORAL | 3 refills | Status: DC
  Filled 2024-04-10: qty 180, 90d supply, fill #0

## 2024-04-11 ENCOUNTER — Other Ambulatory Visit (HOSPITAL_COMMUNITY): Payer: Self-pay

## 2024-04-13 ENCOUNTER — Other Ambulatory Visit: Payer: Self-pay

## 2024-04-13 ENCOUNTER — Ambulatory Visit: Admitting: Family Medicine

## 2024-04-13 ENCOUNTER — Encounter: Payer: Self-pay | Admitting: Family Medicine

## 2024-04-13 ENCOUNTER — Other Ambulatory Visit (HOSPITAL_COMMUNITY): Payer: Self-pay

## 2024-04-13 VITALS — BP 160/100 | HR 100 | Temp 98.0°F | Resp 16 | Ht 66.0 in | Wt 188.0 lb

## 2024-04-13 DIAGNOSIS — M65331 Trigger finger, right middle finger: Secondary | ICD-10-CM

## 2024-04-13 DIAGNOSIS — I10 Essential (primary) hypertension: Secondary | ICD-10-CM

## 2024-04-13 MED ORDER — METHYLPREDNISOLONE ACETATE 40 MG/ML IJ SUSP
40.0000 mg | Freq: Once | INTRAMUSCULAR | Status: AC
  Administered 2024-04-13: 40 mg via INTRAMUSCULAR

## 2024-04-13 MED ORDER — AMLODIPINE BESYLATE 10 MG PO TABS
10.0000 mg | ORAL_TABLET | Freq: Every day | ORAL | 1 refills | Status: DC
  Filled 2024-04-13: qty 90, 90d supply, fill #0

## 2024-04-13 NOTE — Patient Instructions (Signed)
 Ice/cold pack over area for 10-15 min twice daily.  OK to take Tylenol  1000 mg (2 extra strength tabs) or 975 mg (3 regular strength tabs) every 6 hours as needed.  Check your blood pressures 2-3 times per week, alternating the time of day you check it. If it is high, considering waiting 1-2 minutes and rechecking. If it gets higher, your anxiety is likely creeping up and we should avoid rechecking.   Keep the diet clean and stay active.  Let us  know if you need anything.

## 2024-04-13 NOTE — Progress Notes (Signed)
 Musculoskeletal Exam  Patient: Ryan Rhodes DOB: 01/30/1961  DOS: 04/13/2024  SUBJECTIVE:  Chief Complaint:   Chief Complaint  Patient presents with   Hand Pain   Hand Injury    Hand Injury    Ryan Rhodes is a 64 y.o.  male for evaluation and treatment of R hand pain.   Onset:  3 weeks ago. No inj or change in activity.  Location: palm of R hand, 3rd digit Character:  sharp  Progression of issue:  has worsened Associated symptoms: difficulty making a fist Treatment: to date has been rest.   Neurovascular symptoms: no  Hypertension Patient presents for hypertension follow up. He does monitor home blood pressures. Blood pressures ranging on average from 150-160's/80-90's. He is compliant with medication-enalapril  20 mg twice daily.  He had run out of his amlodipine  5 mg daily over the past month. Patient has these side effects of medication: none He is sometimes adhering to a healthy diet overall. Exercise: Some walking  Past Medical History:  Diagnosis Date   Arthritis    gout   Diabetes mellitus (HCC)    type 2   Gout    Gout    Hypertension     Objective: VITAL SIGNS: BP (!) 160/100 (BP Location: Left Arm, Patient Position: Sitting)   Pulse 100   Temp 98 F (36.7 C) (Oral)   Resp 16   Ht 5' 6 (1.676 m)   Wt 188 lb (85.3 kg)   SpO2 96%   BMI 30.34 kg/m  Constitutional: Well formed, well developed. No acute distress. Thorax & Lungs: CTAB no accessory muscle use Heart: RRR, no bruits, no lower extremity edema Musculoskeletal: Right hand.   Normal active range of motion: yes.   Normal passive range of motion: yes Tenderness to palpation: Yes over the distal third metacarpal, palmar side Deformity: no Ecchymosis: no Neurologic: Normal sensory function.  Psychiatric: Normal mood. Age appropriate judgment and insight. Alert & oriented x 3.    Procedure note: Trigger finger injection Verbal consent obtained. The area of interest was palpated and  just distal over the palmar MCP crease, the tendon was marked with an otoscope speculum. The area marked by a speculum was cleaned with alcohol and freeze spray was used for topical anesthesia. A 47-gauge needle was inserted at a 45 degree angle and once under the skin, continued in parallel fashion to the tendon, and used to inject 20 mg of Depo-Medrol  and 0.5 mg of 1% lidocaine  without epinephrine .  The plunger of the syringe was withdrawn prior to injecting the medication to ensure the needle was not in a blood vessel. A Band-Aid was placed. The patient tolerated the procedure well with no immediate complications noted.  Assessment:  Trigger middle finger of right hand - Plan: methylPREDNISolone  acetate (DEPO-MEDROL ) injection 40 mg, PR INJECTION 1 TENDON SHEATH/LIGAMENT APONEUROSIS  Essential hypertension  Plan: Injection today.  Ice, Tylenol , consider splinting.  Will refer to hand if no improvement to consider a procedure. Chronic, not controlled.  Continue enalapril  20 mg twice daily, increase amlodipine  from 5 mg daily to 10 mg daily.  Monitor blood pressure at home.  Counseled on diet and exercise. F/u as originally scheduled with regular PCP in 1 month. The patient voiced understanding and agreement to the plan.   Mabel [place], DO 04/13/2024  1:53 PM

## 2024-04-16 ENCOUNTER — Other Ambulatory Visit

## 2024-04-16 ENCOUNTER — Ambulatory Visit
Admission: RE | Admit: 2024-04-16 | Discharge: 2024-04-16 | Disposition: A | Source: Ambulatory Visit | Attending: Neurosurgery

## 2024-04-16 DIAGNOSIS — M960 Pseudarthrosis after fusion or arthrodesis: Secondary | ICD-10-CM

## 2024-04-17 ENCOUNTER — Ambulatory Visit: Payer: Self-pay

## 2024-04-17 DIAGNOSIS — R911 Solitary pulmonary nodule: Secondary | ICD-10-CM

## 2024-04-19 ENCOUNTER — Other Ambulatory Visit (HOSPITAL_COMMUNITY): Payer: Self-pay

## 2024-04-23 ENCOUNTER — Ambulatory Visit: Admitting: Family Medicine

## 2024-04-23 ENCOUNTER — Other Ambulatory Visit (HOSPITAL_COMMUNITY): Payer: Self-pay

## 2024-04-23 ENCOUNTER — Encounter: Payer: Self-pay | Admitting: Family Medicine

## 2024-04-23 ENCOUNTER — Other Ambulatory Visit: Payer: Self-pay | Admitting: Family

## 2024-04-23 ENCOUNTER — Other Ambulatory Visit: Payer: Self-pay

## 2024-04-23 VITALS — BP 132/80 | HR 100 | Temp 98.0°F | Resp 16 | Ht 66.0 in | Wt 188.8 lb

## 2024-04-23 DIAGNOSIS — I10 Essential (primary) hypertension: Secondary | ICD-10-CM

## 2024-04-23 DIAGNOSIS — E119 Type 2 diabetes mellitus without complications: Secondary | ICD-10-CM

## 2024-04-23 DIAGNOSIS — Z7984 Long term (current) use of oral hypoglycemic drugs: Secondary | ICD-10-CM | POA: Diagnosis not present

## 2024-04-23 DIAGNOSIS — M65331 Trigger finger, right middle finger: Secondary | ICD-10-CM

## 2024-04-23 MED ORDER — DICLOFENAC SODIUM 1 % EX GEL
CUTANEOUS | 1 refills | Status: AC
  Filled 2024-04-23: qty 100, 10d supply, fill #0
  Filled 2024-05-08: qty 100, 25d supply, fill #0

## 2024-04-23 MED ORDER — ENALAPRIL MALEATE 20 MG PO TABS
20.0000 mg | ORAL_TABLET | Freq: Two times a day (BID) | ORAL | 3 refills | Status: AC
  Filled 2024-04-23 – 2024-05-08 (×2): qty 180, 90d supply, fill #0

## 2024-04-23 MED ORDER — AMLODIPINE BESYLATE 10 MG PO TABS
10.0000 mg | ORAL_TABLET | Freq: Every day | ORAL | 1 refills | Status: AC
  Filled 2024-04-23: qty 90, 90d supply, fill #0

## 2024-04-23 MED ORDER — METFORMIN HCL ER 500 MG PO TB24
500.0000 mg | ORAL_TABLET | Freq: Two times a day (BID) | ORAL | 0 refills | Status: AC
  Filled 2024-04-23 – 2024-05-08 (×2): qty 180, 90d supply, fill #0

## 2024-04-23 NOTE — Telephone Encounter (Signed)
 Copied from CRM [id number]. Topic: Clinical - Medication Refill >> Apr 23, 2024 12:48 PM Nessti S wrote: Medication: metFORMIN  (GLUCOPHAGE -XR) 500 MG 24 hr tablet enalapril  (VASOTEC ) 20 MG tablet  Has the patient contacted their pharmacy? Yes (Agent: If no, request that the patient contact the pharmacy for the refill. If patient does not wish to contact the pharmacy document the reason why and proceed with request.) (Agent: If yes, when and what did the pharmacy advise?)  This is the patient's preferred pharmacy:  [place] [facility] [address] [place] [address] Phone: [phone] Fax: [phone]  Is this the correct pharmacy for this prescription? Yes If no, delete pharmacy and type the correct one.   Has the prescription been filled recently? No  Is the patient out of the medication? YES  Has the patient been seen for an appointment in the last year OR does the patient have an upcoming appointment? Yes  Can we respond through MyChart? Yes  Agent: Please be advised that Rx refills may take up to 3 business days. We ask that you follow-up with your pharmacy.

## 2024-04-23 NOTE — Patient Instructions (Signed)
 OK to take Tylenol  1000 mg (2 extra strength tabs) or 975 mg (3 regular strength tabs) every 6 hours as needed.  If you do not hear anything about your referral in the next 1-2 weeks, call our office and ask for an update.  Voltaren  gel 4 times daily as needed.  Let us  know if you need anything.

## 2024-04-23 NOTE — Progress Notes (Signed)
 Chief Complaint  Patient presents with   Follow-up    Follow Up    Subjective: Patient is a 64 y.o. male here for trigger finger.  Had 2 injections in past. One worked well. Had one 10 d ago with no relief. Pain over 3rd palmar spot.  Voltaren  gel did help along with a copper compression glove.  No neurologic signs or symptoms.  Having trouble with gripping due to the pain.  Past Medical History:  Diagnosis Date   Arthritis    gout   Diabetes mellitus (HCC)    type 2   Gout    Gout    Hypertension     Objective: BP 132/80 (BP Location: Left Arm, Patient Position: Sitting)   Pulse 100   Temp 98 F (36.7 C) (Oral)   Resp 16   Ht 5' 6 (1.676 m)   Wt 188 lb 12.8 oz (85.6 kg)   SpO2 96%   BMI 30.47 kg/m  General: Awake, appears stated age Heart: Brisk capillary refill on the right hand MSK: TTP over the right palmar region distally on the third MC; no obvious nodularity or deformity; right third digit did trigger when he flexed his finger and extended Lungs: No accessory muscle use Psych: Age appropriate judgment and insight, normal affect and mood  Assessment and Plan: Trigger middle finger of right hand - Plan: diclofenac  Sodium (VOLTAREN ) 1 % GEL, Ambulatory referral to Hand Surgery  Type 2 diabetes mellitus without complication, without long-term current use of insulin  (HCC) - Plan: metFORMIN  (GLUCOPHAGE -XR) 500 MG 24 hr tablet  Essential hypertension - Plan: enalapril  (VASOTEC ) 20 MG tablet  Voltaren  gel sent in, referral to the hand team for their opinion. Blood pressure is much better today.  Refill chronic medications. The patient voiced understanding and agreement to the plan.  Mabel [place], DO 04/23/24  3:27 PM

## 2024-04-27 ENCOUNTER — Encounter (HOSPITAL_COMMUNITY): Admission: RE | Admit: 2024-04-27 | Discharge: 2024-04-27 | Disposition: A | Source: Ambulatory Visit

## 2024-04-27 DIAGNOSIS — R911 Solitary pulmonary nodule: Secondary | ICD-10-CM | POA: Diagnosis present

## 2024-04-27 LAB — GLUCOSE, CAPILLARY: Glucose-Capillary: 146 mg/dL — ABNORMAL HIGH (ref 70–99)

## 2024-04-27 MED ORDER — FLUDEOXYGLUCOSE F - 18 (FDG) INJECTION
9.3400 | Freq: Once | INTRAVENOUS | Status: AC
  Administered 2024-04-27: 9.34 via INTRAVENOUS

## 2024-05-07 ENCOUNTER — Other Ambulatory Visit (HOSPITAL_COMMUNITY): Payer: Self-pay

## 2024-05-08 ENCOUNTER — Other Ambulatory Visit (HOSPITAL_COMMUNITY): Payer: Self-pay

## 2024-05-08 ENCOUNTER — Encounter (HOSPITAL_COMMUNITY): Payer: Self-pay | Admitting: Pharmacist

## 2024-05-08 ENCOUNTER — Other Ambulatory Visit: Payer: Self-pay

## 2024-05-09 ENCOUNTER — Ambulatory Visit

## 2024-05-09 ENCOUNTER — Other Ambulatory Visit (HOSPITAL_COMMUNITY): Payer: Self-pay

## 2024-05-09 ENCOUNTER — Telehealth: Payer: Self-pay

## 2024-05-09 VITALS — BP 146/92 | HR 100 | Temp 97.4°F | Ht 66.0 in | Wt 188.0 lb

## 2024-05-09 DIAGNOSIS — R942 Abnormal results of pulmonary function studies: Secondary | ICD-10-CM

## 2024-05-09 DIAGNOSIS — R918 Other nonspecific abnormal finding of lung field: Secondary | ICD-10-CM

## 2024-05-09 DIAGNOSIS — R911 Solitary pulmonary nodule: Secondary | ICD-10-CM | POA: Insufficient documentation

## 2024-05-09 NOTE — Patient Instructions (Addendum)
" °  VISIT SUMMARY: During your visit, we discussed the findings of a lung nodule and lymph node activity from recent imaging. We also addressed your recent fall and shoulder injury, as well as your ongoing back issues.  YOUR PLAN: SOLITARY PULMONARY NODULE, RIGHT LUNG: A nodule was found in your right lung. It didn't light up on recent PET CT, but as it is persistent and unchanged and more than 1 cm in size, biopsy is needed to confirm this and rule out other conditions. -You are scheduled for a bronchoscopy to biopsy the right lung nodule and mediastinal lymph nodes. -Please fast after midnight before the procedure. -Arrange for transportation after the procedure due to sedation. -We discussed the potential risks of bronchoscopy, including bleeding and pneumothorax and need for further procedures and hospital stay  ENLARGED MEDIASTINAL LYMPH NODES: Your lymph nodes show activity on the PET scan, which could be due to various conditions. Further investigation is needed to rule out malignancy. -We will proceed with the biopsy of the lung nodule to assess any potential link to the lymph node activity. -malignancy, sarcoidosis, infection are differentials considered    Contains text generated by [employer].   "
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
## 2024-05-09 NOTE — Assessment & Plan Note (Signed)
 Present since oct 2025, no change in size and no uptake in PET but given size and persistence and mediastinal uptake , advised bronchoscopy I explained the bronchoscopic biopsy procedure at depth.  We discussed about other potential options including continued follow up. Risks and benefits of bronchoscopy +/- biopsy discussed with the patient and his family in details and all the questions were answered. They understand the risk of bleeding, pneumothorax, injury to blood vessels and would like to proceed for the bronchoscopy with biopsy.  Will need to hold Mounjaro  1 week prior to the procedure.  Orders:   Procedural/ Surgical Case Request: VIDEO BRONCHOSCOPY WITH ENDOBRONCHIAL NAVIGATION, BRONCHOSCOPY, WITH EBUS; Future

## 2024-05-09 NOTE — Telephone Encounter (Signed)
 Letter given to pt by Amy. Case # [id number]. Sending to [employer] to [facility]

## 2024-05-09 NOTE — Telephone Encounter (Signed)
 Please schedule the following:  Provider performing procedure: Dewitte Vannice Diagnosis: right lung nodule Which side if for nodule / mass? right Procedure: navigational bronch with EBUS  Has patient been spoken to by Provider and given informed consent? yes Anesthesia: GA Do you need Fluro? yes Duration of procedure: 80 min Date: 06/07/2024 Alternate Date: other available days  Time: AM/ PM Location: [facility] Does patient have OSA? no DM? yes Or Latex allergy? no Medication Restriction/ Anticoagulate/Antiplatelet: pt on Mounjaro  - generally needs to be held for 7 days/please confirm with endo with our hospital policy Pre-op Labs Ordered:determined by Anesthesia Imaging request: none- should be able to use Jan 6 CT chest (If, SuperDimension CT Chest, please have STAT courier sent to ENDO)

## 2024-05-11 NOTE — Progress Notes (Signed)
 Pt was seen 05/09/2024. Nfn

## 2024-05-14 ENCOUNTER — Ambulatory Visit: Admitting: Orthopedic Surgery

## 2024-05-17 ENCOUNTER — Encounter: Admitting: Family Medicine

## 2024-06-07 ENCOUNTER — Encounter (HOSPITAL_COMMUNITY): Payer: Self-pay

## 2024-06-07 ENCOUNTER — Ambulatory Visit (HOSPITAL_COMMUNITY): Admit: 2024-06-07

## 2024-06-07 DIAGNOSIS — R911 Solitary pulmonary nodule: Secondary | ICD-10-CM | POA: Insufficient documentation

## 2024-06-15 ENCOUNTER — Ambulatory Visit: Admitting: Acute Care
